# Patient Record
Sex: Male | Born: 1990 | Race: Black or African American | Hispanic: No | Marital: Single | Smoking: Current every day smoker
Health system: Southern US, Community
[De-identification: ages and names within clinical notes are randomized; demographics above are authoritative.]

## PROBLEM LIST (undated history)

## (undated) DIAGNOSIS — F909 Attention-deficit hyperactivity disorder, unspecified type: Secondary | ICD-10-CM

## (undated) DIAGNOSIS — J45909 Unspecified asthma, uncomplicated: Secondary | ICD-10-CM

## (undated) DIAGNOSIS — F209 Schizophrenia, unspecified: Secondary | ICD-10-CM

## (undated) DIAGNOSIS — F319 Bipolar disorder, unspecified: Secondary | ICD-10-CM

## (undated) HISTORY — PX: NO PAST SURGERIES: SHX2092

---
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
## 2009-10-12 ENCOUNTER — Emergency Department (HOSPITAL_COMMUNITY): Admission: EM | Admit: 2009-10-12 | Discharge: 2009-10-12 | Payer: Self-pay | Admitting: Emergency Medicine

## 2012-06-23 ENCOUNTER — Emergency Department (HOSPITAL_COMMUNITY)
Admission: EM | Admit: 2012-06-23 | Discharge: 2012-06-23 | Disposition: A | Discharge status: Home / Self Care | Payer: Self-pay | Attending: Emergency Medicine | Admitting: Emergency Medicine

## 2012-06-23 ENCOUNTER — Emergency Department (HOSPITAL_COMMUNITY): Payer: Self-pay

## 2012-06-23 ENCOUNTER — Encounter (HOSPITAL_COMMUNITY): Payer: Self-pay

## 2012-06-23 DIAGNOSIS — S0990XA Unspecified injury of head, initial encounter: Secondary | ICD-10-CM | POA: Insufficient documentation

## 2012-06-23 DIAGNOSIS — R220 Localized swelling, mass and lump, head: Secondary | ICD-10-CM | POA: Insufficient documentation

## 2012-06-23 DIAGNOSIS — R221 Localized swelling, mass and lump, neck: Secondary | ICD-10-CM | POA: Insufficient documentation

## 2012-06-23 DIAGNOSIS — R404 Transient alteration of awareness: Secondary | ICD-10-CM | POA: Insufficient documentation

## 2012-06-23 DIAGNOSIS — S0230XA Fracture of orbital floor, unspecified side, initial encounter for closed fracture: Secondary | ICD-10-CM | POA: Insufficient documentation

## 2012-06-23 DIAGNOSIS — IMO0002 Reserved for concepts with insufficient information to code with codable children: Secondary | ICD-10-CM

## 2012-06-23 DIAGNOSIS — R69 Illness, unspecified: Secondary | ICD-10-CM | POA: Insufficient documentation

## 2012-06-23 HISTORY — DX: Schizophrenia, unspecified: F20.9

## 2012-06-23 HISTORY — DX: Bipolar disorder, unspecified: F31.9

## 2012-06-23 LAB — COMPREHENSIVE METABOLIC PANEL
AST: 50 U/L — ABNORMAL HIGH (ref 0–37)
Alkaline Phosphatase: 137 U/L — ABNORMAL HIGH (ref 39–117)
BUN: 11 mg/dL (ref 6–23)
CO2: 26 mEq/L (ref 19–32)
Chloride: 104 mEq/L (ref 96–112)
Creatinine, Ser: 0.82 mg/dL (ref 0.50–1.35)
GFR calc non Af Amer: >90 mL/min (ref >90)
Potassium: 3.4 mEq/L — ABNORMAL LOW (ref 3.5–5.1)
Total Bilirubin: 0.5 mg/dL (ref 0.3–1.2)

## 2012-06-23 LAB — CBC WITH DIFFERENTIAL/PLATELET
Basophils Absolute: 0 10*3/uL (ref 0.0–0.1)
HCT: 39.5 % (ref 39.0–52.0)
Hemoglobin: 13 g/dL (ref 13.0–17.0)
Lymphocytes Relative: 12 % (ref 12–46)
Monocytes Absolute: 0.8 10*3/uL (ref 0.1–1.0)
Monocytes Relative: 8 % (ref 3–12)
Neutro Abs: 7.6 10*3/uL (ref 1.7–7.7)
RBC: 4.63 MIL/uL (ref 4.22–5.81)
WBC: 9.7 10*3/uL (ref 4.0–10.5)

## 2012-06-23 LAB — PROTIME-INR: INR: 0.98 (ref 0.00–1.49)

## 2012-06-23 MED ORDER — CLINDAMYCIN PHOSPHATE 600 MG/50ML IV SOLN
600.0000 mg | Freq: Once | INTRAVENOUS | Status: AC
  Administered 2012-06-23: 600 mg via INTRAVENOUS
  Filled 2012-06-23: qty 50

## 2012-06-23 MED ORDER — TETANUS-DIPHTH-ACELL PERTUSSIS 5-2.5-18.5 LF-MCG/0.5 IM SUSP
0.5000 mL | Freq: Once | INTRAMUSCULAR | Status: AC
  Administered 2012-06-23: 0.5 mL via INTRAMUSCULAR
  Filled 2012-06-23: qty 0.5

## 2012-06-23 MED ORDER — SODIUM CHLORIDE 0.9 % IV BOLUS (SEPSIS)
1000.0000 mL | Freq: Once | INTRAVENOUS | Status: AC
  Administered 2012-06-23: 1000 mL via INTRAVENOUS

## 2012-06-23 MED ORDER — CLINDAMYCIN HCL 150 MG PO CAPS: 150.0000 mg | ORAL_CAPSULE | Freq: Four times a day (QID) | ORAL | Status: DC

## 2012-06-23 MED ORDER — OXYCODONE-ACETAMINOPHEN 5-325 MG PO TABS: 1.0000 | ORAL_TABLET | ORAL | Status: AC | PRN

## 2012-06-23 NOTE — ED Notes (Signed)
Pt wound cleaned with NS. Pt given icepack for left eye. Pt asked for a urine sample and pt stated he didn't have to go to the bathroom.
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
## 2012-06-23 NOTE — ED Notes (Signed)
Pt moved to Pod B conference room.

## 2012-06-23 NOTE — ED Notes (Signed)
Advised patient we need a urine sample. 
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
## 2012-06-25 ENCOUNTER — Emergency Department (HOSPITAL_COMMUNITY)
Admission: EM | Admit: 2012-06-25 | Discharge: 2012-06-25 | Disposition: A | Discharge status: Home / Self Care | Payer: Self-pay | Attending: Emergency Medicine | Admitting: Emergency Medicine

## 2012-06-25 ENCOUNTER — Encounter (HOSPITAL_COMMUNITY): Payer: Self-pay | Admitting: Emergency Medicine

## 2012-06-25 ENCOUNTER — Emergency Department (HOSPITAL_COMMUNITY): Payer: Self-pay

## 2012-06-25 DIAGNOSIS — Z8659 Personal history of other mental and behavioral disorders: Secondary | ICD-10-CM | POA: Insufficient documentation

## 2012-06-25 DIAGNOSIS — F17200 Nicotine dependence, unspecified, uncomplicated: Secondary | ICD-10-CM | POA: Insufficient documentation

## 2012-06-25 DIAGNOSIS — Z79899 Other long term (current) drug therapy: Secondary | ICD-10-CM | POA: Insufficient documentation

## 2012-06-25 DIAGNOSIS — F319 Bipolar disorder, unspecified: Secondary | ICD-10-CM | POA: Insufficient documentation

## 2012-06-25 DIAGNOSIS — R4182 Altered mental status, unspecified: Secondary | ICD-10-CM | POA: Insufficient documentation

## 2012-06-25 LAB — COMPREHENSIVE METABOLIC PANEL
BUN: 14 mg/dL (ref 6–23)
CO2: 25 mEq/L (ref 19–32)
Calcium: 8.8 mg/dL (ref 8.4–10.5)
Creatinine, Ser: 0.84 mg/dL (ref 0.50–1.35)
GFR calc Af Amer: >90 mL/min (ref >90)
GFR calc non Af Amer: >90 mL/min (ref >90)
Glucose, Bld: 93 mg/dL (ref 70–99)
Sodium: 138 mEq/L (ref 135–145)
Total Protein: 6.6 g/dL (ref 6.0–8.3)

## 2012-06-25 LAB — RAPID URINE DRUG SCREEN, HOSP PERFORMED
Amphetamines: NOT DETECTED
Benzodiazepines: NOT DETECTED
Cocaine: NOT DETECTED
Opiates: POSITIVE — AB

## 2012-06-25 LAB — CBC WITH DIFFERENTIAL/PLATELET
Eosinophils Absolute: 0.2 10*3/uL (ref 0.0–0.7)
Eosinophils Relative: 2 % (ref 0–5)
HCT: 34.4 % — ABNORMAL LOW (ref 39.0–52.0)
Lymphocytes Relative: 29 % (ref 12–46)
Lymphs Abs: 2.2 10*3/uL (ref 0.7–4.0)
MCH: 28.4 pg (ref 26.0–34.0)
MCV: 84.1 fL (ref 78.0–100.0)
Monocytes Absolute: 0.6 10*3/uL (ref 0.1–1.0)
Monocytes Relative: 8 % (ref 3–12)
Platelets: 303 10*3/uL (ref 150–400)
RBC: 4.09 MIL/uL — ABNORMAL LOW (ref 4.22–5.81)

## 2012-06-25 MED ORDER — SODIUM CHLORIDE 0.9 % IV SOLN
Freq: Once | INTRAVENOUS | Status: AC
  Administered 2012-06-25: 20:00:00 via INTRAVENOUS

## 2012-06-25 MED ORDER — NALOXONE HCL 0.4 MG/ML IJ SOLN
0.4000 mg | Freq: Once | INTRAMUSCULAR | Status: AC
  Administered 2012-06-25: 0.4 mg via INTRAVENOUS
  Filled 2012-06-25: qty 1
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
## 2012-06-25 NOTE — ED Notes (Signed)
Discharge instructions reviewed w/ pt and family member, verbalize understanding. No prescriptions provided at discharge.
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
## 2012-06-26 ENCOUNTER — Emergency Department (HOSPITAL_COMMUNITY)
Admission: EM | Admit: 2012-06-26 | Discharge: 2012-06-30 | Disposition: A | Discharge status: Home / Self Care | Payer: Self-pay | Attending: Emergency Medicine | Admitting: Emergency Medicine

## 2012-06-26 ENCOUNTER — Encounter (HOSPITAL_COMMUNITY): Payer: Self-pay | Admitting: Emergency Medicine

## 2012-06-26 DIAGNOSIS — F259 Schizoaffective disorder, unspecified: Secondary | ICD-10-CM | POA: Insufficient documentation

## 2012-06-26 DIAGNOSIS — R220 Localized swelling, mass and lump, head: Secondary | ICD-10-CM | POA: Insufficient documentation

## 2012-06-26 DIAGNOSIS — Z046 Encounter for general psychiatric examination, requested by authority: Secondary | ICD-10-CM | POA: Insufficient documentation

## 2012-06-26 DIAGNOSIS — F309 Manic episode, unspecified: Secondary | ICD-10-CM | POA: Insufficient documentation

## 2012-06-26 DIAGNOSIS — F17200 Nicotine dependence, unspecified, uncomplicated: Secondary | ICD-10-CM | POA: Insufficient documentation

## 2012-06-26 DIAGNOSIS — Z79899 Other long term (current) drug therapy: Secondary | ICD-10-CM | POA: Insufficient documentation

## 2012-06-26 LAB — ETHANOL: Alcohol, Ethyl (B): <11 mg/dL (ref 0–11)

## 2012-06-26 LAB — CBC
HCT: 37.6 % — ABNORMAL LOW (ref 39.0–52.0)
Hemoglobin: 12.3 g/dL — ABNORMAL LOW (ref 13.0–17.0)
MCH: 27.9 pg (ref 26.0–34.0)
MCHC: 32.7 g/dL (ref 30.0–36.0)
RBC: 4.41 MIL/uL (ref 4.22–5.81)

## 2012-06-26 LAB — COMPREHENSIVE METABOLIC PANEL
ALT: 34 U/L (ref 0–53)
AST: 33 U/L (ref 0–37)
CO2: 31 mEq/L (ref 19–32)
Calcium: 9.4 mg/dL (ref 8.4–10.5)
Chloride: 102 mEq/L (ref 96–112)
Creatinine, Ser: 0.87 mg/dL (ref 0.50–1.35)
GFR calc Af Amer: >90 mL/min (ref >90)
GFR calc non Af Amer: >90 mL/min (ref >90)
Glucose, Bld: 80 mg/dL (ref 70–99)
Total Bilirubin: 0.3 mg/dL (ref 0.3–1.2)

## 2012-06-26 LAB — RAPID URINE DRUG SCREEN, HOSP PERFORMED
Barbiturates: NOT DETECTED
Benzodiazepines: NOT DETECTED

## 2012-06-26 MED ORDER — BENZTROPINE MESYLATE 1 MG PO TABS
1.0000 mg | ORAL_TABLET | Freq: Two times a day (BID) | ORAL | Status: DC
  Administered 2012-06-26 – 2012-06-30 (×9): 1 mg via ORAL
  Filled 2012-06-26 (×9): qty 1

## 2012-06-26 MED ORDER — CLINDAMYCIN HCL 150 MG PO CAPS
150.0000 mg | ORAL_CAPSULE | Freq: Four times a day (QID) | ORAL | Status: DC
  Administered 2012-06-26 – 2012-06-30 (×16): 150 mg via ORAL
  Filled 2012-06-26 (×21): qty 1

## 2012-06-26 MED ORDER — VENLAFAXINE HCL 75 MG PO TABS
75.0000 mg | ORAL_TABLET | Freq: Every morning | ORAL | Status: DC
  Administered 2012-06-26 – 2012-06-27 (×2): 75 mg via ORAL
  Filled 2012-06-26 (×2): qty 1

## 2012-06-26 MED ORDER — RISPERIDONE 2 MG PO TABS
3.0000 mg | ORAL_TABLET | Freq: Two times a day (BID) | ORAL | Status: DC
  Administered 2012-06-26 – 2012-06-30 (×9): 3 mg via ORAL
  Filled 2012-06-26 (×3): qty 1
  Filled 2012-06-26: qty 3
  Filled 2012-06-26 (×2): qty 1
  Filled 2012-06-26: qty 3
  Filled 2012-06-26 (×3): qty 1

## 2012-06-26 MED ORDER — ZIPRASIDONE MESYLATE 20 MG IM SOLR
INTRAMUSCULAR | Status: AC
  Filled 2012-06-26: qty 20

## 2012-06-26 MED ORDER — QUETIAPINE FUMARATE 50 MG PO TABS
50.0000 mg | ORAL_TABLET | Freq: Two times a day (BID) | ORAL | Status: DC
  Administered 2012-06-26 – 2012-06-27 (×3): 50 mg via ORAL
  Filled 2012-06-26 (×3): qty 1

## 2012-06-26 MED ORDER — ZIPRASIDONE MESYLATE 20 MG IM SOLR
20.0000 mg | Freq: Once | INTRAMUSCULAR | Status: AC
  Administered 2012-06-26: 20 mg via INTRAMUSCULAR

## 2012-06-26 NOTE — ED Notes (Signed)
Tele Pysch in progress 
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
## 2012-06-26 NOTE — ED Provider Notes (Signed)
1100.  Pt ha been recommended for inpt psych eval and mgmnt by the telepsych provider.  ACT Team is seeking placement.  Tobin Chad, MD 06/26/12 1110
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
## 2012-06-26 NOTE — ED Notes (Signed)
Please call PO when pt is being d/c on transferred.Pt has warrant pending for probation validation.Constance Holster @ office [phone] Cell- [phone].
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
## 2012-06-26 NOTE — ED Notes (Signed)
Pt agitated and pacing in room

## 2012-06-26 NOTE — ED Notes (Signed)
Pt in and out of room talking loud in the halls instructed to stay in room with [employer] assitance

## 2012-06-26 NOTE — ED Notes (Signed)
Pt agitated and requesting to be handcuffed.  Pt handcuffed by [employer] and Kathreen Cornfield and Dr. Patria Mane made aware.  Administered Geodon as ordered.

## 2012-06-27 DIAGNOSIS — F259 Schizoaffective disorder, unspecified: Secondary | ICD-10-CM

## 2012-06-27 DIAGNOSIS — F309 Manic episode, unspecified: Secondary | ICD-10-CM

## 2012-06-27 MED ORDER — ZOLPIDEM TARTRATE 5 MG PO TABS
5.0000 mg | ORAL_TABLET | Freq: Every evening | ORAL | Status: DC | PRN
  Administered 2012-06-27 – 2012-06-29 (×3): 5 mg via ORAL
  Filled 2012-06-27 (×3): qty 1

## 2012-06-27 MED ORDER — IBUPROFEN 600 MG PO TABS: 600.0000 mg | ORAL_TABLET | Freq: Three times a day (TID) | ORAL | Status: DC | PRN

## 2012-06-27 MED ORDER — ACETAMINOPHEN 325 MG PO TABS: 650.0000 mg | ORAL_TABLET | ORAL | Status: DC | PRN

## 2012-06-27 MED ORDER — DIVALPROEX SODIUM ER 500 MG PO TB24
1000.0000 mg | ORAL_TABLET | Freq: Every day | ORAL | Status: DC
  Administered 2012-06-27: 1000 mg via ORAL
  Filled 2012-06-27 (×2): qty 2

## 2012-06-27 MED ORDER — LORAZEPAM 1 MG PO TABS
1.0000 mg | ORAL_TABLET | Freq: Three times a day (TID) | ORAL | Status: DC | PRN
  Administered 2012-06-27 – 2012-06-28 (×2): 1 mg via ORAL
  Filled 2012-06-27 (×3): qty 1

## 2012-06-27 MED ORDER — NICOTINE 21 MG/24HR TD PT24
21.0000 mg | MEDICATED_PATCH | Freq: Every day | TRANSDERMAL | Status: DC | PRN
  Administered 2012-06-27: 21 mg via TRANSDERMAL
  Filled 2012-06-27 (×2): qty 1

## 2012-06-27 MED ORDER — ONDANSETRON HCL 4 MG PO TABS: 4.0000 mg | ORAL_TABLET | Freq: Three times a day (TID) | ORAL | Status: DC | PRN

## 2012-06-27 NOTE — [facility] Counselor (Addendum)
Referred to the [facility].  No bed availability at [place], [place], [place], [place], [place], [place], and [place]. Pt referred to [facility] Authorization number is [id number]. Per Amor, pt in on wait list at [facility].
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
## 2012-06-27 NOTE — ED Notes (Signed)
Patient does not eat pork. Patient states he is of the muslim religion.

## 2012-06-27 NOTE — ED Provider Notes (Signed)
He is currently, calm, and cooperative. He is not expressing active suicidal or homicidal ideation. ACT is evaluating him in conjunction with the psychiatrist. He may be able to be discharged today with a no harm contract.  Flint Melter, MD 06/27/12 [phone]

## 2012-06-27 NOTE — ED Notes (Signed)
Heat pack given to pt. To apply to his right eye.
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
## 2012-06-27 NOTE — ED Notes (Signed)
Pt. Continues to go from room to bathroom to RN station, have asked pt. To please remain in room unless he needs something.

## 2012-06-27 NOTE — ED Notes (Signed)
Patient has hard time falling asleep. So medication will be held until patient wakes up.

## 2012-06-27 NOTE — ED Notes (Signed)
Heat pack given to pt. Apply to his right eye.

## 2012-06-28 MED ORDER — DIVALPROEX SODIUM ER 500 MG PO TB24
1500.0000 mg | ORAL_TABLET | Freq: Every day | ORAL | Status: DC
  Administered 2012-06-28 – 2012-06-30 (×3): 1500 mg via ORAL
  Filled 2012-06-28 (×4): qty 3

## 2012-06-28 MED ORDER — LORAZEPAM 1 MG PO TABS
1.0000 mg | ORAL_TABLET | Freq: Four times a day (QID) | ORAL | Status: DC | PRN
  Administered 2012-06-28 – 2012-06-30 (×3): 1 mg via ORAL
  Filled 2012-06-28 (×4): qty 1

## 2012-06-28 MED ORDER — DIVALPROEX SODIUM ER 500 MG PO TB24: 1500.0000 mg | ORAL_TABLET | Freq: Every day | ORAL | Status: DC

## 2012-06-28 NOTE — Consult Note (Signed)
Reason for Consult: Schizoaffective, bipolar, manic symptoms Referring Physician: Dr. Leeroy Cha is an 21 y.o. male.  HPI: Patient was seen and chart reviewed. Patient has a limited insight, and impulse control. Patient has been threatening the staff nurse that he is going to throw severe anger outbursts, if he was not released. Patient was not afraid to go back to the jail. Patient the was served staying in his bed awake, alert, oriented, requesting to be sent home. Patient was stated he is difficult for him to stay in his bed and cannot, pacing on the floor. Patient stated he does not know why his mother brought him to the emergency department. Patient denies symptoms of for depression, psychosis, suicidal, homicidal ideation, but he continued to have increased energy, goal-directed activity, racing thoughts, and decreased need for sleep.  Past Medical History  Diagnosis Date  . Bipolar affective disorder   . Schizophrenia     History reviewed. No pertinent past surgical history.  Family History  Problem Relation Age of Onset  . Hypertension Mother     Social History:  reports that he has been smoking.  He does not have any smokeless tobacco history on file. He reports that he drinks about .6 ounces of alcohol per week. His drug history not on file.  Allergies: No Known Allergies  Medications: I have reviewed the patient's current medications.  No results found for this or any previous visit (from the past 48 hour(s)).  No results found.  Positive for aggressive behavior, bipolar, mood swings and sleep disturbance Blood pressure 121/69, pulse 77, temperature 97.9 F (36.6 C), temperature source Oral, resp. rate 20, SpO2 98.00%.   Assessment/Plan: Schizoaffective disorder, most recent episode, mania  Will increase Depakote to 1500 mg at bedtime and continue his Risperdal 3 mg 2 times in the Cogentin 1 mg 2 times daily. Patient has been waiting for the [facility] hospital bed availability.  Zayda Angell,JANARDHAHA R. 06/28/2012, 12:13 PM

## 2012-06-28 NOTE — ED Provider Notes (Addendum)
  Physical Exam  BP 120/86  Pulse 102  Temp 97.3 F (36.3 C) (Oral)  Resp 18  SpO2 99%  Physical Exam  ED Course  Procedures  MDM Patient was psychotic break. Recently seen for results. At that time does not appear to be a risk to himself. He is on [facility] waiting list. Is also been referred to [facility]. Patient is now stated that he is going to start getting violent he doesn't treatment and will go back to jail. He'll be seen by Dr. Para March R. Rubin Payor, MD 06/28/12 1610  Juliet Rude Rubin Payor, MD 06/28/12 [phone]

## 2012-06-28 NOTE — Progress Notes (Signed)
Paged at 1738 and was in ED [facility] Unit at 1608. Spoke with nurses concerning Philip [name] status. Philip [name] asked to see a chaplain after such was offered to him.  Philip [name] is a spiritual person, who studies the writings of various religions. He says he is Muslim, but when a visit by an Imam was offered as a possibility he said he did not wish to see Muslims. He has holy books of at least two faith traditions, [place] and Muslim. When speaking of religion he is calm and reflective. With religion as a gate to our discussions, we practiced both breathing and meditation exercises which calmed him down until he needed to go to the restroom. (This is a common response to the near total relaxed state he was in)  After he returned he wished to watch television rather than return to the relaxed state he was in. What did happen while he was relaxed was his nervous twitch of the feet and hands ceased.  Mindfulness meditation rather than mind emptying meditation seems to be best for Philip [name].   Philip [name] asked if he could walk in the sunshine and smoke a cigarette. Chaplain responded that he would pass these requests on to staff, but he, the chaplain, had no control over such matters. I wonder if nicotine withdrawal may be a contributing factor to his anxious condition.  Philip [name] takes suggestions better than I expected for an initial visit.  Simple meditation guiding procedures taught to nurse at her request.   Follow up visits by chaplains recommended. Chaplains should be a called when "upsetting news" is presented

## 2012-06-29 NOTE — ED Notes (Signed)
Pt vomited his breakfast all over the bathroom. He offered and did clean it up himself. We notified housekeeping but they haven't come as yet. Pt said he seen at least one big pill in the vomit. Pt came up and asked what the big white pills were and what they were for as well. He said he'd never taken them before and the eggs may have made him vomit.

## 2012-06-29 NOTE — ED Notes (Signed)
Pt is in and out of his room asking if he talks right to the doctor will he let him go home because it's driving him crazy being in here and staying in his room makes him think of crazy things that he's going to end up doing. He said he will take some medicine. Will medicate per orders if permitted.

## 2012-06-29 NOTE — ED Notes (Signed)
Up to nursing station talking to writer about TV show he's watching.

## 2012-06-29 NOTE — ED Notes (Signed)
Pt out of the shower and up using the phone.
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
## 2012-06-29 NOTE — ED Notes (Signed)
Up to bathroom

## 2012-06-29 NOTE — ED Notes (Signed)
Pt given items to shower and is currently in the shower.

## 2012-06-29 NOTE — ED Notes (Signed)
Pt is in bathroom brushing his teeth.

## 2012-06-29 NOTE — ED Notes (Signed)
Pt reports he doesn't eat pork. He's been up to the nursing station a few times asking to take his shower now. Tried to redirect him as Clinical research associate was in the middle of charting trays.

## 2012-06-29 NOTE — ED Notes (Signed)
No PRN orders to give for agitation will continue to watch pt's behavior.

## 2012-06-29 NOTE — ED Provider Notes (Addendum)
BP 114/78  Pulse 77  Temp 97.8 F (36.6 C) (Oral)  Resp 20  SpO2 100% No new issues overnight. Resting well. On [facility] waiting list.   Loren Racer, MD 06/29/12 0732  BP 102/63  Pulse 71  Temp 97.8 F (36.6 C) (Oral)  Resp 18  SpO2 100% No new issues overnight. Waiting list at [facility]  Loren Racer, MD 06/30/12 [phone]  Cleared for D/C by Dr Shela Commons. IVC rescinded. F/u with outpatient psych  Loren Racer, MD 06/30/12 1340

## 2012-06-29 NOTE — ED Notes (Signed)
Up to nursing station

## 2012-06-29 NOTE — ED Notes (Signed)
Pt came up to nursing station to check the time. He easily redirected back to his room.

## 2012-06-29 NOTE — ED Notes (Signed)
Up to nursing station asking off duty officer a bunch of questions.

## 2012-06-30 MED ORDER — BENZTROPINE MESYLATE 1 MG PO TABS: 1.0000 mg | ORAL_TABLET | Freq: Two times a day (BID) | ORAL | Status: DC

## 2012-06-30 MED ORDER — DIVALPROEX SODIUM ER 500 MG PO TB24: 1500.0000 mg | ORAL_TABLET | Freq: Every day | ORAL | Status: DC

## 2012-06-30 MED ORDER — CLINDAMYCIN HCL 150 MG PO CAPS: 150.0000 mg | ORAL_CAPSULE | Freq: Four times a day (QID) | ORAL | Status: AC

## 2012-06-30 MED ORDER — ZIPRASIDONE MESYLATE 20 MG IM SOLR: 10.0000 mg | Freq: Four times a day (QID) | INTRAMUSCULAR | Status: DC | PRN

## 2012-06-30 MED ORDER — ZIPRASIDONE MESYLATE 20 MG IM SOLR
10.0000 mg | Freq: Once | INTRAMUSCULAR | Status: DC
  Filled 2012-06-30: qty 20

## 2012-06-30 MED ORDER — RISPERIDONE 3 MG PO TABS: 3.0000 mg | ORAL_TABLET | Freq: Two times a day (BID) | ORAL | Status: DC

## 2012-06-30 NOTE — ED Notes (Signed)
Act into see 

## 2012-06-30 NOTE — ED Notes (Signed)
Up to the bathroom 

## 2012-06-30 NOTE — ED Notes (Signed)
Up to the desk on the phone 

## 2012-06-30 NOTE — ED Notes (Signed)
Pt up to the desk, frustrated about having to stay. Reports that he would rather go back to jail than stay here and do his 6 months.  Pt is aware that he is going to talk w/ the telepsych today. Redirected back to his room w/ difficulty, but will not stay  there.

## 2012-06-30 NOTE — ED Notes (Signed)
Pt h as been dc'd by dr Elsie Saas.  Pt is aware, up to the desk to call him mom for a ride home.  Pt has a appt on sept 5 w/ his psychatrist at [facility] and was instructed to take his meds as directed, follow  and  with his MD as scheduled.  Pt also encouraged to follow up w/ his MD concerning his eye, and to return for any increased pain/difficulty w/ his vision.  Pt verbalized understanding.

## 2012-06-30 NOTE — ED Notes (Addendum)
Laying quietly in room, calm, cooperative, pleasant asking about telepsych.
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
## 2012-06-30 NOTE — ED Notes (Addendum)
Pt getting increasinly aggitated,Security into assist, dr Ranae Palms aware, orders received.  Pt not wanting to go to his room because "the devil's in there".  Pt did return to room, talking w/ security.

## 2012-06-30 NOTE — ED Notes (Signed)
Pt's sister is here to take him home.  Dc instructions and meds reviewed w/ both,  pt and sister verbalized understanding.  Pt encouraged to take medications as directed, follow up concerning his face/eye as instructed and return for any problems.  Pt verbalized understanding.  Belongings returned after pt left the unit.

## 2012-06-30 NOTE — ED Notes (Signed)
Up tot he bathroom to shower and change scrubs 

## 2012-06-30 NOTE — ED Notes (Signed)
Pt laying in room, quiet, still angry, and is angry that the dr's are listening ti his mother instead of him.  Pt still saying he would rather go th jail instead of stay here.  NAD, pt is aware that he will talk w/ the psychatrist today.

## 2012-06-30 NOTE — Consult Note (Signed)
Reason for Consult: Schizoaffective disorder, recent episode is mania, who Referring Physician: Dr. Charissa Bash is an 21 y.o. male.  HPI: Patient was seen and chart reviewed. Patient has been stable on his current medication management without adverse effect of his current medication. Patient has been feeling better, and has no irritability, agitation and aggressive behaviors. Patient has requested to be released to the outpatient psychiatric services and willing to take his medication. Patient has denied symptoms of depression, anxiety, auditory, visual hallucinations, delusions, and paranoia. Patient has been taking his medication without extrapyramidal symptoms.  Past Medical History  Diagnosis Date  . Bipolar affective disorder   . Schizophrenia     History reviewed. No pertinent past surgical history.  Family History  Problem Relation Age of Onset  . Hypertension Mother     Social History:  reports that he has been smoking.  He does not have any smokeless tobacco history on file. He reports that he drinks about .6 ounces of alcohol per week. His drug history not on file.  Allergies: No Known Allergies  Medications: I have reviewed the patient's current medications.  No results found for this or any previous visit (from the past 48 hour(s)).  No results found.  No depression, No anxiety, No psychosis and Positive for aggressive behavior, learning difficulty, mood swings and tobacco use Blood pressure 114/74, pulse 89, temperature 97.4 F (36.3 C), temperature source Oral, resp. rate 20, SpO2 100.00%.   Assessment/Plan: Schizoaffective disorder, recent episode is mania.  Recommended outpatient psychiatric services with the medication management at [facility] behavioral health. Patient does not meet criteria for acute psychiatric hospitalization at this time and his the involuntary commitment will be rescended. His current medications. For benztropine 1 mg 2 times  daily Depakote ER 1500 mg at bedtime, Risperdal 3 mg twice daily. May provide prescription which may help until he can reach his the outpatient psychiatric services.  Emanuelle Hammerstrom,JANARDHAHA R. 06/30/2012, 1:12 PM

## 2012-07-01 ENCOUNTER — Telehealth (HOSPITAL_COMMUNITY): Payer: Self-pay | Admitting: Licensed Clinical Social Worker

## 2012-07-02 NOTE — ED Provider Notes (Signed)
Medical screening examination/treatment/procedure(s) were performed by non-physician practitioner and as supervising physician I was immediately available for consultation/collaboration.   Lyanne Co, MD 07/02/12 [phone]
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
## 2014-08-25 ENCOUNTER — Encounter (HOSPITAL_COMMUNITY): Payer: Self-pay | Admitting: Emergency Medicine

## 2014-08-25 ENCOUNTER — Emergency Department (HOSPITAL_COMMUNITY)
Admission: EM | Admit: 2014-08-25 | Discharge: 2014-08-26 | Disposition: A | Discharge status: Home / Self Care | Payer: Self-pay | Attending: Emergency Medicine | Admitting: Emergency Medicine

## 2014-08-25 DIAGNOSIS — F1410 Cocaine abuse, uncomplicated: Secondary | ICD-10-CM | POA: Insufficient documentation

## 2014-08-25 DIAGNOSIS — Z79899 Other long term (current) drug therapy: Secondary | ICD-10-CM | POA: Insufficient documentation

## 2014-08-25 DIAGNOSIS — F209 Schizophrenia, unspecified: Secondary | ICD-10-CM | POA: Insufficient documentation

## 2014-08-25 DIAGNOSIS — Z720 Tobacco use: Secondary | ICD-10-CM | POA: Insufficient documentation

## 2014-08-25 DIAGNOSIS — F3131 Bipolar disorder, current episode depressed, mild: Secondary | ICD-10-CM | POA: Insufficient documentation

## 2014-08-25 DIAGNOSIS — F1210 Cannabis abuse, uncomplicated: Secondary | ICD-10-CM | POA: Insufficient documentation

## 2014-08-25 DIAGNOSIS — F419 Anxiety disorder, unspecified: Secondary | ICD-10-CM | POA: Insufficient documentation

## 2014-08-25 LAB — COMPREHENSIVE METABOLIC PANEL
ALK PHOS: 87 U/L (ref 39–117)
ALT: 21 U/L (ref 0–53)
AST: 23 U/L (ref 0–37)
Albumin: 4.4 g/dL (ref 3.5–5.2)
Anion gap: 17 — ABNORMAL HIGH (ref 5–15)
BILIRUBIN TOTAL: 0.9 mg/dL (ref 0.3–1.2)
BUN: 11 mg/dL (ref 6–23)
CHLORIDE: 102 meq/L (ref 96–112)
CO2: 24 meq/L (ref 19–32)
CREATININE: 1.19 mg/dL (ref 0.50–1.35)
Calcium: 9.5 mg/dL (ref 8.4–10.5)
GFR calc Af Amer: >90 mL/min (ref >90)
GFR, EST NON AFRICAN AMERICAN: 84 mL/min — AB (ref >90)
Glucose, Bld: 99 mg/dL (ref 70–99)
POTASSIUM: 3.3 meq/L — AB (ref 3.7–5.3)
SODIUM: 143 meq/L (ref 137–147)
Total Protein: 7.8 g/dL (ref 6.0–8.3)

## 2014-08-25 LAB — RAPID URINE DRUG SCREEN, HOSP PERFORMED
AMPHETAMINES: NOT DETECTED
BARBITURATES: NOT DETECTED
BENZODIAZEPINES: NOT DETECTED
Cocaine: POSITIVE — AB
Opiates: NOT DETECTED
TETRAHYDROCANNABINOL: POSITIVE — AB

## 2014-08-25 LAB — CBC
HCT: 40.3 % (ref 39.0–52.0)
Hemoglobin: 13.5 g/dL (ref 13.0–17.0)
MCH: 29.2 pg (ref 26.0–34.0)
MCHC: 33.5 g/dL (ref 30.0–36.0)
MCV: 87 fL (ref 78.0–100.0)
PLATELETS: 264 10*3/uL (ref 150–400)
RBC: 4.63 MIL/uL (ref 4.22–5.81)
RDW: 12.3 % (ref 11.5–15.5)
WBC: 8.7 10*3/uL (ref 4.0–10.5)

## 2014-08-25 LAB — ACETAMINOPHEN LEVEL: Acetaminophen (Tylenol), Serum: <15 ug/mL (ref 10–30)

## 2014-08-25 LAB — ETHANOL: Alcohol, Ethyl (B): <11 mg/dL (ref 0–11)

## 2014-08-25 LAB — SALICYLATE LEVEL: Salicylate Lvl: <2 mg/dL — ABNORMAL LOW (ref 2.8–20.0)

## 2014-08-25 MED ORDER — IBUPROFEN 200 MG PO TABS: 600.0000 mg | ORAL_TABLET | Freq: Three times a day (TID) | ORAL | Status: DC | PRN

## 2014-08-25 MED ORDER — ACETAMINOPHEN 325 MG PO TABS: 650.0000 mg | ORAL_TABLET | ORAL | Status: DC | PRN

## 2014-08-25 MED ORDER — ALUM & MAG HYDROXIDE-SIMETH 200-200-20 MG/5ML PO SUSP: 30.0000 mL | ORAL | Status: DC | PRN

## 2014-08-25 MED ORDER — ONDANSETRON HCL 4 MG PO TABS: 4.0000 mg | ORAL_TABLET | Freq: Three times a day (TID) | ORAL | Status: DC | PRN

## 2014-08-25 MED ORDER — TRAZODONE HCL 50 MG PO TABS
50.0000 mg | ORAL_TABLET | Freq: Every evening | ORAL | Status: DC | PRN
  Administered 2014-08-25: 50 mg via ORAL
  Filled 2014-08-25: qty 1

## 2014-08-25 MED ORDER — HALOPERIDOL 5 MG PO TABS
5.0000 mg | ORAL_TABLET | Freq: Four times a day (QID) | ORAL | Status: DC | PRN
  Administered 2014-08-25: 5 mg via ORAL
  Filled 2014-08-25: qty 1

## 2014-08-25 MED ORDER — LORAZEPAM 1 MG PO TABS
1.0000 mg | ORAL_TABLET | Freq: Three times a day (TID) | ORAL | Status: DC | PRN
  Administered 2014-08-25: 1 mg via ORAL
  Filled 2014-08-25: qty 1

## 2014-08-25 NOTE — ED Notes (Signed)
Pt has in belonging bag: white shirt, black sweat pants, gray tennis shoes.

## 2014-08-25 NOTE — Progress Notes (Signed)
  CARE MANAGEMENT ED NOTE 08/25/2014  Patient:  Philip [name],Philip [name]   Account Number:  [id number]  Date Initiated:  08/25/2014  Documentation initiated by:  Radford PaxFERRERO,Tarin Navarez  Subjective/Objective Assessment:   Patient presents to Ed with insomnia, hearing voices and HI     Subjective/Objective Assessment Detail:   Patient with pmhx of Bipolar effective disorder, Schizophrenia     Action/Plan:   Action/Plan Detail:   Anticipated DC Date:       Status Recommendation to Physician:   Result of Recommendation:    Other ED Services  Consult Working Plan    DC Planning Services  Other  PCP issues    Choice offered to / List presented to:            Status of service:  Completed, signed off  ED Comments:   ED Comments Detail:  EDCM spoke to patient at bedside.  Patient listed as not having a pcp or [place]. Patient is speaking quickly, going from one topic to the next.  [facility]  Patient responded, "Well, in prison it was umm, but when I got out it was the lady through the TV but I don't want to see her anymore.  My family has my records, I've been moving too fast, I just want to chill.  I know I'm not supposed to come here for my medications, but I stopped taking my medications because I thought they were messing me up but now I think they wetre slowing me down.  I ain't slept in four days.  I just want to chill."  Patient was able to say that [facility].  No further EDCM needs at this time.

## 2014-08-25 NOTE — ED Notes (Signed)
Patient up in his room working out and rapping

## 2014-08-25 NOTE — ED Notes (Addendum)
Pt reports he is having thoughts of hurting other people. Pt reports he is hearing, voices are telling him to hurt others. Pt says that he has been up for 4 days, anxious in triage.

## 2014-08-25 NOTE — ED Notes (Signed)
Pt unable to give UA at the present time.
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
## 2014-08-26 DIAGNOSIS — F319 Bipolar disorder, unspecified: Secondary | ICD-10-CM

## 2014-08-26 MED ORDER — TRAZODONE HCL 50 MG PO TABS: 50.0000 mg | ORAL_TABLET | Freq: Every evening | ORAL | Status: DC | PRN

## 2014-08-26 MED ORDER — QUETIAPINE FUMARATE 50 MG PO TABS: 50.0000 mg | ORAL_TABLET | Freq: Every day | ORAL | Status: DC
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
## 2014-08-26 NOTE — Discharge Instructions (Signed)

## 2014-08-26 NOTE — ED Provider Notes (Signed)
CSN: [id number]     Arrival date & time 08/25/14  2009 History   First MD Initiated Contact with Patient 08/25/14 2050     Chief Complaint  Patient presents with  . Psychiatric Evaluation     (Consider location/radiation/quality/duration/timing/severity/associated sxs/prior Treatment) HPI  Past Medical History  Diagnosis Date  . Bipolar affective disorder   . Schizophrenia    History reviewed. No pertinent past surgical history. Family History  Problem Relation Age of Onset  . Hypertension Mother    History  Substance Use Topics  . Smoking status: Current Every Day Smoker -- 1.00 packs/day for 6 years  . Smokeless tobacco: Not on file  . Alcohol Use: 0.6 oz/week    1 Cans of beer per week    Review of Systems  Level V caveat applies due to psychosis and hallucinations  Allergies  Shrimp  Home Medications   Prior to Admission medications   Medication Sig Start Date End Date Taking? Authorizing Provider  benztropine (COGENTIN) 1 MG tablet Take 1 tablet (1 mg total) by mouth 2 (two) times daily. 06/30/12   Loren Raceravid Yelverton, MD  divalproex (DEPAKOTE ER) 500 MG 24 hr tablet Take 3 tablets (1,500 mg total) by mouth at bedtime. 06/30/12 06/30/13  Loren Raceravid Yelverton, MD  QUEtiapine (SEROQUEL) 50 MG tablet Take 50 mg by mouth 2 (two) times daily.     Historical Provider, MD  risperiDONE (RISPERDAL) 3 MG tablet Take 1 tablet (3 mg total) by mouth 2 (two) times daily. 06/30/12   Loren Raceravid Yelverton, MD  venlafaxine (EFFEXOR) 75 MG tablet Take 75 mg by mouth every morning.     Historical Provider, MD   BP 137/88  Pulse 99  Temp(Src) 98.3 F (36.8 C) (Oral)  Resp 18  SpO2 95% Physical Exam  Nursing note and vitals reviewed. Constitutional: He is oriented to person, place, and time. He appears well-developed and well-nourished. No distress.  HENT:  Head: Normocephalic and atraumatic.  Mouth/Throat: Oropharynx is clear and moist.  Eyes: Pupils are equal, round, and reactive to light.   Neck: Normal range of motion. Neck supple.  Cardiovascular: Normal rate, regular rhythm and normal heart sounds.  Exam reveals no gallop and no friction rub.   No murmur heard. Pulmonary/Chest: Effort normal and breath sounds normal.  Musculoskeletal: He exhibits no edema.  Neurological: He is alert and oriented to person, place, and time. He exhibits normal muscle tone. Coordination normal.  Skin: Skin is warm and dry. No rash noted. No erythema.  Psychiatric: His mood appears anxious. His speech is rapid and/or pressured. He is hyperactive. He expresses homicidal and suicidal ideation.    ED Course  Procedures (including critical care time) Labs Review Labs Reviewed  COMPREHENSIVE METABOLIC PANEL - Abnormal; Notable for the following:    Potassium 3.3 (*)    GFR calc non Af Amer 84 (*)    Anion gap 17 (*)    All other components within normal limits  SALICYLATE LEVEL - Abnormal; Notable for the following:    Salicylate Lvl <2.0 (*)    All other components within normal limits  URINE RAPID DRUG SCREEN (HOSP PERFORMED) - Abnormal; Notable for the following:    Cocaine POSITIVE (*)    Tetrahydrocannabinol POSITIVE (*)    All other components within normal limits  ACETAMINOPHEN LEVEL  CBC  ETHANOL     Patient will need psychiatric evaluation as he does appear to be psychotic and hallucinating      Carlyle Dollyhristopher W Shariece Viveiros, PA-C  08/26/14 0119 

## 2014-08-26 NOTE — Progress Notes (Signed)
Patient was provided with clean scrubs and a personal hygiene kit.
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
## 2014-08-26 NOTE — [facility] Suicide Risk Assessment (Signed)
   Demographic Factors:  Male and Unemployed  Total Time spent with patient: 20 minutes  Psychiatric Specialty Exam: Physical Exam  ROS  Blood pressure 114/61, pulse 97, temperature 98.6 F (37 C), temperature source Oral, resp. rate 20, SpO2 100.00%.There is no weight on file to calculate BMI.  General Appearance: Casual  Eye Contact::  Good  Speech:  Clear and Coherent  Volume:  Normal  Mood:  Euthymic  Affect:  Appropriate  Thought Process:  Circumstantial  Orientation:  Full (Time, Place, and Person)  Thought Content:  Negative  Suicidal Thoughts:  No  Homicidal Thoughts:  No  Memory:  Immediate;   Fair Recent;   Fair Remote;   Fair  Judgement:  Fair  Insight:  Shallow  Psychomotor Activity:  Normal  Concentration:  Fair  Recall:  [employer]  Fund of Knowledge:Good  Language: Fair  Akathisia:  No  Handed:  Right  AIMS (if indicated):     Assets:  Communication Skills Desire for Improvement Physical Health  Sleep:       Musculoskeletal: Strength & Muscle Tone: within normal limits Gait & Station: normal Patient leans: N/A   Mental Status Per Nursing Assessment::   On Admission:     Current Mental Status by Physician: Patient denies suicidal ideation, intent or plan  Loss Factors: NA  Historical Factors: NA  Risk Reduction Factors:   Sense of responsibility to family, Living with another person, especially a relative and Positive social support  Continued Clinical Symptoms:  Resolving mood symptoms  Cognitive Features That Contribute To Risk:  Closed-mindedness    Suicide Risk:  Minimal: No identifiable suicidal ideation.  Patients presenting with no risk factors but with morbid ruminations; may be classified as minimal risk based on the severity of the depressive symptoms  Discharge Diagnoses:   AXIS I:  Bipolar disorder- unspecified  AXIS II:  Cluster B Traits AXIS III:   Past Medical History  Diagnosis Date  . Bipolar affective disorder    . Schizophrenia    AXIS IV:  other psychosocial or environmental problems and problems with primary support group AXIS V:  61-70 mild symptoms  Plan Of Care/Follow-up recommendations:  Activity:  as tolerated Diet:  healthy  Is patient on multiple antipsychotic therapies at discharge:  No   Has Patient had three or more failed trials of antipsychotic monotherapy by history:  No  Recommended Plan for Multiple Antipsychotic Therapies: NA    Thedore MinsAkintayo, Joselle Deeds, MD 08/26/2014, 11:28 AM

## 2014-08-27 NOTE — ED Provider Notes (Signed)
  Medical screening examination/treatment/procedure(s) were performed by non-physician practitioner and as supervising physician I was immediately available for consultation/collaboration.   EKG Interpretation None        Gerhard Munchobert Jordell Outten, MD 08/27/14 1414

## 2014-08-30 ENCOUNTER — Encounter (HOSPITAL_COMMUNITY): Payer: Self-pay | Admitting: Emergency Medicine

## 2014-08-30 ENCOUNTER — Emergency Department (HOSPITAL_COMMUNITY)
Admission: EM | Admit: 2014-08-30 | Discharge: 2014-09-01 | Disposition: A | Discharge status: Other Acute Inpatient Hospital | Payer: Self-pay | Attending: Emergency Medicine | Admitting: Emergency Medicine

## 2014-08-30 DIAGNOSIS — F259 Schizoaffective disorder, unspecified: Secondary | ICD-10-CM | POA: Insufficient documentation

## 2014-08-30 DIAGNOSIS — F312 Bipolar disorder, current episode manic severe with psychotic features: Secondary | ICD-10-CM | POA: Diagnosis present

## 2014-08-30 DIAGNOSIS — Z720 Tobacco use: Secondary | ICD-10-CM | POA: Insufficient documentation

## 2014-08-30 DIAGNOSIS — F909 Attention-deficit hyperactivity disorder, unspecified type: Secondary | ICD-10-CM | POA: Insufficient documentation

## 2014-08-30 DIAGNOSIS — Z79899 Other long term (current) drug therapy: Secondary | ICD-10-CM | POA: Insufficient documentation

## 2014-08-30 HISTORY — DX: Attention-deficit hyperactivity disorder, unspecified type: F90.9

## 2014-08-30 LAB — COMPREHENSIVE METABOLIC PANEL
ALBUMIN: 4.4 g/dL (ref 3.5–5.2)
ALT: 53 U/L (ref 0–53)
AST: 87 U/L — ABNORMAL HIGH (ref 0–37)
Alkaline Phosphatase: 94 U/L (ref 39–117)
Anion gap: 17 — ABNORMAL HIGH (ref 5–15)
BUN: 11 mg/dL (ref 6–23)
CALCIUM: 9.7 mg/dL (ref 8.4–10.5)
CO2: 24 mEq/L (ref 19–32)
CREATININE: 1.06 mg/dL (ref 0.50–1.35)
Chloride: 99 mEq/L (ref 96–112)
GFR calc non Af Amer: >90 mL/min (ref >90)
GLUCOSE: 100 mg/dL — AB (ref 70–99)
Potassium: 3.6 mEq/L — ABNORMAL LOW (ref 3.7–5.3)
Sodium: 140 mEq/L (ref 137–147)
Total Bilirubin: 0.9 mg/dL (ref 0.3–1.2)
Total Protein: 8.1 g/dL (ref 6.0–8.3)

## 2014-08-30 LAB — RAPID URINE DRUG SCREEN, HOSP PERFORMED
Amphetamines: NOT DETECTED
Barbiturates: NOT DETECTED
Benzodiazepines: NOT DETECTED
COCAINE: NOT DETECTED
OPIATES: NOT DETECTED
Tetrahydrocannabinol: POSITIVE — AB

## 2014-08-30 LAB — CBC
HEMATOCRIT: 38.3 % — AB (ref 39.0–52.0)
Hemoglobin: 13.3 g/dL (ref 13.0–17.0)
MCH: 30 pg (ref 26.0–34.0)
MCHC: 34.7 g/dL (ref 30.0–36.0)
MCV: 86.5 fL (ref 78.0–100.0)
Platelets: 318 10*3/uL (ref 150–400)
RBC: 4.43 MIL/uL (ref 4.22–5.81)
RDW: 12.1 % (ref 11.5–15.5)
WBC: 12.2 10*3/uL — ABNORMAL HIGH (ref 4.0–10.5)

## 2014-08-30 LAB — ACETAMINOPHEN LEVEL: Acetaminophen (Tylenol), Serum: <15 ug/mL (ref 10–30)

## 2014-08-30 LAB — SALICYLATE LEVEL: Salicylate Lvl: <2 mg/dL — ABNORMAL LOW (ref 2.8–20.0)

## 2014-08-30 LAB — ETHANOL

## 2014-08-30 MED ORDER — ALUM & MAG HYDROXIDE-SIMETH 200-200-20 MG/5ML PO SUSP: 30.0000 mL | ORAL | Status: DC | PRN

## 2014-08-30 MED ORDER — IBUPROFEN 200 MG PO TABS: 600.0000 mg | ORAL_TABLET | Freq: Three times a day (TID) | ORAL | Status: DC | PRN

## 2014-08-30 MED ORDER — LORAZEPAM 2 MG/ML IJ SOLN
2.0000 mg | Freq: Once | INTRAMUSCULAR | Status: AC
  Administered 2014-08-30: 2 mg via INTRAMUSCULAR
  Filled 2014-08-30: qty 1

## 2014-08-30 MED ORDER — DIPHENHYDRAMINE HCL 50 MG/ML IJ SOLN
50.0000 mg | Freq: Once | INTRAMUSCULAR | Status: AC
  Administered 2014-08-30: 50 mg via INTRAVENOUS
  Filled 2014-08-30: qty 1

## 2014-08-30 MED ORDER — HALOPERIDOL LACTATE 5 MG/ML IJ SOLN
5.0000 mg | Freq: Once | INTRAMUSCULAR | Status: AC | PRN
  Administered 2014-08-30: 5 mg via INTRAMUSCULAR
  Filled 2014-08-30: qty 1

## 2014-08-30 MED ORDER — ONDANSETRON HCL 4 MG PO TABS: 4.0000 mg | ORAL_TABLET | Freq: Three times a day (TID) | ORAL | Status: DC | PRN

## 2014-08-30 MED ORDER — ACETAMINOPHEN 325 MG PO TABS
650.0000 mg | ORAL_TABLET | ORAL | Status: DC | PRN
Start: 2014-08-30 — End: 2014-09-01
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
## 2014-08-30 NOTE — ED Provider Notes (Signed)
CSN: [id number]     Arrival date & time 08/30/14  1956 History   First MD Initiated Contact with Patient 08/30/14 2115     Chief Complaint  Patient presents with  . IVC      (Consider location/radiation/quality/duration/timing/severity/associated sxs/prior Treatment) HPI Comments: IVC paperwork filed by his mother for increasingly bizarre and dangerous behavior. Per IVC report he has been noncompliant with his medications, has not been sleeping has not been performing his ADLs or basic hygiene. He was running around in the parking lot in front of moving cars today he has been concerned that people are out to kill him but also talking about wanting to shoot other people. On my exam patient denies thisthere is very tangential. He denies SI or HI as well as AV hallucinations. He states that he is taking drugs "that the rappers take" including Compazine, Xanax, Adderall.    Past Medical History  Diagnosis Date  . Bipolar affective disorder   . Schizophrenia   . ADHD (attention deficit hyperactivity disorder)    History reviewed. No pertinent past surgical history. Family History  Problem Relation Age of Onset  . Hypertension Mother    History  Substance Use Topics  . Smoking status: Current Every Day Smoker -- 1.00 packs/day for 6 years  . Smokeless tobacco: Not on file  . Alcohol Use: 0.6 oz/week    1 Cans of beer per week    Review of Systems  Constitutional: Negative for fever, activity change, appetite change and fatigue.  HENT: Negative for congestion, facial swelling, rhinorrhea and trouble swallowing.   Eyes: Negative for photophobia and pain.  Respiratory: Negative for cough, chest tightness and shortness of breath.   Cardiovascular: Negative for chest pain and leg swelling.  Gastrointestinal: Negative for nausea, vomiting, abdominal pain, diarrhea and constipation.  Endocrine: Negative for polydipsia and polyuria.  Genitourinary: Negative for dysuria, urgency, decreased  urine volume and difficulty urinating.  Musculoskeletal: Negative for back pain and gait problem.  Skin: Negative for color change, rash and wound.  Allergic/Immunologic: Negative for immunocompromised state.  Neurological: Negative for dizziness, facial asymmetry, speech difficulty, weakness, numbness and headaches.  Psychiatric/Behavioral: Positive for behavioral problems, sleep disturbance and agitation. Negative for confusion and decreased concentration.      Allergies  Shrimp  Home Medications   Prior to Admission medications   Medication Sig Start Date End Date Taking? Authorizing Provider  benztropine (COGENTIN) 1 MG tablet Take 1 tablet (1 mg total) by mouth 2 (two) times daily. 06/30/12   Loren Raceravid Yelverton, MD  divalproex (DEPAKOTE ER) 500 MG 24 hr tablet Take 3 tablets (1,500 mg total) by mouth at bedtime. 06/30/12 06/30/13  Loren Raceravid Yelverton, MD  QUEtiapine (SEROQUEL) 50 MG tablet Take 50 mg by mouth 2 (two) times daily.     Historical Provider, MD  QUEtiapine (SEROQUEL) 50 MG tablet Take 1 tablet (50 mg total) by mouth at bedtime. 08/26/14   Mojeed Akintayo  risperiDONE (RISPERDAL) 3 MG tablet Take 1 tablet (3 mg total) by mouth 2 (two) times daily. 06/30/12   Loren Raceravid Yelverton, MD  traZODone (DESYREL) 50 MG tablet Take 1 tablet (50 mg total) by mouth at bedtime as needed for sleep. 08/26/14   Mojeed Akintayo  venlafaxine (EFFEXOR) 75 MG tablet Take 75 mg by mouth every morning.     Historical Provider, MD   BP 101/65 mmHg  Pulse 110  Temp(Src) 98.1 F (36.7 C) (Oral)  Resp 20  SpO2 96% Physical Exam  Constitutional: He is  oriented to person, place, and time. He appears well-developed and well-nourished. No distress.  HENT:  Head: Normocephalic and atraumatic.  Mouth/Throat: No oropharyngeal exudate.  Eyes: Pupils are equal, round, and reactive to light.  Neck: Normal range of motion. Neck supple.  Cardiovascular: Normal rate, regular rhythm and normal heart sounds.  Exam reveals  no gallop and no friction rub.   No murmur heard. Pulmonary/Chest: Effort normal and breath sounds normal. No respiratory distress. He has no wheezes. He has no rales.  Abdominal: Soft. Bowel sounds are normal. He exhibits no distension and no mass. There is no tenderness. There is no rebound and no guarding.  Musculoskeletal: Normal range of motion. He exhibits no edema or tenderness.  Neurological: He is alert and oriented to person, place, and time. GCS eye subscore is 4. GCS verbal subscore is 5. GCS motor subscore is 6.  Skin: Skin is warm and dry.  Psychiatric: His behavior is normal. His affect is labile. His speech is tangential. Thought content is paranoid.    ED Course  Procedures (including critical care time) Labs Review Labs Reviewed  CBC - Abnormal; Notable for the following:    WBC 12.2 (*)    HCT 38.3 (*)    All other components within normal limits  COMPREHENSIVE METABOLIC PANEL - Abnormal; Notable for the following:    Potassium 3.6 (*)    Glucose, Bld 100 (*)    AST 87 (*)    Anion gap 17 (*)    All other components within normal limits  SALICYLATE LEVEL - Abnormal; Notable for the following:    Salicylate Lvl <2.0 (*)    All other components within normal limits  URINE RAPID DRUG SCREEN (HOSP PERFORMED) - Abnormal; Notable for the following:    Tetrahydrocannabinol POSITIVE (*)    All other components within normal limits  ACETAMINOPHEN LEVEL  ETHANOL    Imaging Review No results found.   EKG Interpretation None      MDM   Final diagnoses:  Schizoaffective disorder, unspecified type    Pt is a 23 y.o. male with Pmhx as above who presents with IVC paperwork filed by his mother for increasingly bizarre and dangerous behavior. Per IVC report he has been noncompliant with his medications, has not been sleeping has not been performing his ADLs or basic hygiene. He was running around in the parking lot in front of moving cars today he has been concerned  that people are out to kill him but also talking about wanting to shoot other people. On my exam patient denies thisthere is very tangential. He denies SI or HI as well as AV hallucinations. He states that he is taking drugs "that the rappers take" including Compazine, Xanax, Adderall. First opinion filed by myself. TTS will evaluate.   2 CSN is seen. Patient appropriate for inpatient treatment.I did not restart home meds as I am not sure what he has been taking.      Toy CookeyMegan Docherty, MD 08/31/14 [phone]

## 2014-08-30 NOTE — ED Notes (Signed)
Pt. To [facility] from ED ambulatory without difficulty. Report from [facility] RN. Pt. Is alert and oriented, warm and dry in no distress. Pt. Denies SI, HI, and AVH. Pt. Calm and cooperative. Pt. Encouraged to let Nursing staff know of any concerns or needs.

## 2014-08-30 NOTE — BH Assessment (Signed)
Assessment completed. Consulted Janann Augustori Burkett, NP who recommended inpatient treatment. Dr. Micheline Mazeocherty has been informed of the recommendation.

## 2014-08-30 NOTE — ED Notes (Signed)
Pt. Becoming increasingly agitated. Asking for "my meds". Pt. Refused to talk to [place] about his medications.
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
## 2014-08-30 NOTE — ED Notes (Signed)
MD at bedside. 

## 2014-08-31 DIAGNOSIS — F312 Bipolar disorder, current episode manic severe with psychotic features: Secondary | ICD-10-CM

## 2014-08-31 DIAGNOSIS — F1410 Cocaine abuse, uncomplicated: Secondary | ICD-10-CM

## 2014-08-31 MED ORDER — ZIPRASIDONE MESYLATE 20 MG IM SOLR
20.0000 mg | Freq: Once | INTRAMUSCULAR | Status: AC
  Administered 2014-08-31: 20 mg via INTRAMUSCULAR
  Filled 2014-08-31: qty 20

## 2014-08-31 MED ORDER — QUETIAPINE FUMARATE 50 MG PO TABS
50.0000 mg | ORAL_TABLET | Freq: Two times a day (BID) | ORAL | Status: DC
  Administered 2014-08-31: 50 mg via ORAL
  Filled 2014-08-31: qty 1

## 2014-08-31 MED ORDER — DIPHENHYDRAMINE HCL 50 MG/ML IJ SOLN
50.0000 mg | Freq: Once | INTRAMUSCULAR | Status: AC
  Administered 2014-08-31: 50 mg via INTRAMUSCULAR
  Filled 2014-08-31: qty 1

## 2014-08-31 MED ORDER — VENLAFAXINE HCL 75 MG PO TABS
75.0000 mg | ORAL_TABLET | Freq: Every morning | ORAL | Status: DC
  Filled 2014-08-31: qty 1

## 2014-08-31 MED ORDER — RISPERIDONE 2 MG PO TABS
3.0000 mg | ORAL_TABLET | Freq: Two times a day (BID) | ORAL | Status: DC
  Administered 2014-09-01: 3 mg via ORAL
  Filled 2014-08-31 (×2): qty 1

## 2014-08-31 MED ORDER — BENZTROPINE MESYLATE 1 MG PO TABS
1.0000 mg | ORAL_TABLET | Freq: Two times a day (BID) | ORAL | Status: DC
  Administered 2014-08-31 – 2014-09-01 (×2): 1 mg via ORAL
  Filled 2014-08-31 (×2): qty 1

## 2014-08-31 MED ORDER — DIVALPROEX SODIUM ER 500 MG PO TB24
1500.0000 mg | ORAL_TABLET | Freq: Every day | ORAL | Status: DC
  Administered 2014-09-01: 1500 mg via ORAL
  Filled 2014-08-31 (×2): qty 3

## 2014-08-31 MED ORDER — RISPERIDONE 2 MG PO TABS
2.0000 mg | ORAL_TABLET | Freq: Two times a day (BID) | ORAL | Status: DC
  Administered 2014-08-31: 2 mg via ORAL
  Filled 2014-08-31: qty 1

## 2014-08-31 MED ORDER — TRAZODONE HCL 50 MG PO TABS
50.0000 mg | ORAL_TABLET | Freq: Every evening | ORAL | Status: DC | PRN
  Administered 2014-08-31: 50 mg via ORAL
  Filled 2014-08-31: qty 1

## 2014-08-31 MED ORDER — LORAZEPAM 2 MG/ML IJ SOLN
INTRAMUSCULAR | Status: AC
  Administered 2014-08-31: 2 mg via INTRAMUSCULAR
  Filled 2014-08-31: qty 1

## 2014-08-31 MED ORDER — LORAZEPAM 2 MG/ML IJ SOLN
2.0000 mg | Freq: Once | INTRAMUSCULAR | Status: AC
  Administered 2014-08-31: 2 mg via INTRAMUSCULAR
  Filled 2014-08-31: qty 1

## 2014-08-31 NOTE — ED Notes (Signed)
Pt is loud swinging his arms and threatening to staff. Received orders from MD for IM medications. Gave as ordered. Pt remains safe on the unit.

## 2014-08-31 NOTE — Progress Notes (Signed)
  CARE MANAGEMENT ED NOTE 08/31/2014  Patient:  Philip [name],Philip [name]   Account Number:  [id number]  Date Initiated:  08/31/2014  Documentation initiated by:  Radford PaxFERRERO,Kamrynn Melott  Subjective/Objective Assessment:   Patient presents to Ed for increasingly bizarre and dangerous behavior.     Subjective/Objective Assessment Detail:   Patient IVC's by his mother. patient has not been sleeping or performing ADL's.   Talking about wanting to shoot other people.  Patient with pmhx of Bipolar. schizophrenia, ADHD.     Action/Plan:   Action/Plan Detail:   Anticipated DC Date:       Status Recommendation to Physician:   Result of Recommendation:    Other ED Services  Consult Working Plan    DC Planning Services  Other  PCP issues    Choice offered to / List presented to:            Status of service:  Completed, signed off  ED Comments:   ED Comments Detail:  Patient listed as not having a pcp or insurance [place].  EDCM [facility], informed patient of services there and walk in times. EDCM also provided patient with list of pcps who accept self pay patients, list of discount pharmacies and websites needymeds.org and GoodRX.com for medication assistance, phone number to inquire about the orange card, phone number to inquire about Mediciad, phone number to inquire about the Affordable Care Act, financial resources in the community such as local churches, salvation army, urban ministries, and dental assistance for uninsured patients. Patient sound asleep at this time.  Theses resources were left for patient on night stand.  No further EDCM needs at this tim

## 2014-08-31 NOTE — ED Notes (Signed)
Pt. Repeatedly activating emergency room alarms and dancing around naked in and outside his room. Will consult with EDP reference IM meds in addition to the haldol that is ordered.
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
## 2014-08-31 NOTE — ED Notes (Signed)
Presents standing at desk, conversing freely,  Denies SI, HI or AV hallucinations.  Will monitor for safety.

## 2014-08-31 NOTE — ED Notes (Signed)
Patient is given a snack crackers and cheese and a sprite

## 2014-08-31 NOTE — BH Assessment (Signed)
Patient's Mother Brita RompLori Futrell called to check on patient's status, informed of need for inpatient placement. Mother understands this is not the best time to visit yet would like to be informed when placement found. Mother may be reached at work [phone]; home [phone]; or daughter's at [phone].  Carney Bernatherine C Harrill, LCSW

## 2014-08-31 NOTE — ED Notes (Signed)
Pt is anxious asking about leaving and wanting to see the doctor this morning. Pt has been out in the hall making multiple requests at the nurses station. Pt observed talking to himself. Offered support and 15 minute checks. Gave a deck of cards to pt. He verbally denies voices. Pt denies si and hi. Safety maintained in the SAPPU.

## 2014-09-01 ENCOUNTER — Encounter (HOSPITAL_COMMUNITY): Payer: Self-pay | Admitting: *Deleted

## 2014-09-01 ENCOUNTER — Inpatient Hospital Stay (HOSPITAL_COMMUNITY)
Admission: AD | Admit: 2014-09-01 | Discharge: 2014-09-10 | DRG: 885 | Disposition: A | Discharge status: Home / Self Care | Payer: Federal, State, Local not specified - Other | Source: Intra-hospital | Attending: Psychiatry | Admitting: Psychiatry

## 2014-09-01 DIAGNOSIS — F250 Schizoaffective disorder, bipolar type: Secondary | ICD-10-CM | POA: Diagnosis present

## 2014-09-01 DIAGNOSIS — E7220 Disorder of urea cycle metabolism, unspecified: Secondary | ICD-10-CM

## 2014-09-01 DIAGNOSIS — F1290 Cannabis use, unspecified, uncomplicated: Secondary | ICD-10-CM | POA: Diagnosis present

## 2014-09-01 DIAGNOSIS — F909 Attention-deficit hyperactivity disorder, unspecified type: Secondary | ICD-10-CM | POA: Diagnosis present

## 2014-09-01 DIAGNOSIS — F29 Unspecified psychosis not due to a substance or known physiological condition: Secondary | ICD-10-CM | POA: Diagnosis present

## 2014-09-01 DIAGNOSIS — F1220 Cannabis dependence, uncomplicated: Secondary | ICD-10-CM | POA: Insufficient documentation

## 2014-09-01 DIAGNOSIS — Z9114 Patient's other noncompliance with medication regimen: Secondary | ICD-10-CM | POA: Diagnosis present

## 2014-09-01 DIAGNOSIS — Z79899 Other long term (current) drug therapy: Secondary | ICD-10-CM

## 2014-09-01 LAB — VALPROIC ACID LEVEL: Valproic Acid Lvl: <10 ug/mL — ABNORMAL LOW (ref 50.0–100.0)

## 2014-09-01 MED ORDER — TRAZODONE HCL 100 MG PO TABS
100.0000 mg | ORAL_TABLET | Freq: Every evening | ORAL | Status: DC | PRN
  Administered 2014-09-02 – 2014-09-06 (×2): 100 mg via ORAL
  Filled 2014-09-01 (×3): qty 1

## 2014-09-01 MED ORDER — OLANZAPINE 5 MG PO TBDP
5.0000 mg | ORAL_TABLET | Freq: Once | ORAL | Status: AC
Start: 2014-09-01 — End: 2014-09-01
  Administered 2014-09-01: 5 mg via ORAL
  Filled 2014-09-01: qty 1

## 2014-09-01 MED ORDER — DIPHENHYDRAMINE HCL 50 MG PO CAPS
50.0000 mg | ORAL_CAPSULE | Freq: Once | ORAL | Status: AC
  Administered 2014-09-01: 50 mg via ORAL
  Filled 2014-09-01: qty 2
  Filled 2014-09-01: qty 1

## 2014-09-01 MED ORDER — LORAZEPAM 1 MG PO TABS
2.0000 mg | ORAL_TABLET | Freq: Four times a day (QID) | ORAL | Status: DC | PRN
  Administered 2014-09-01 – 2014-09-02 (×2): 2 mg via ORAL
  Filled 2014-09-01 (×2): qty 2

## 2014-09-01 MED ORDER — LORAZEPAM 1 MG PO TABS
2.0000 mg | ORAL_TABLET | Freq: Once | ORAL | Status: AC
  Administered 2014-09-01: 2 mg via ORAL
  Filled 2014-09-01: qty 2

## 2014-09-01 MED ORDER — TRAZODONE HCL 100 MG PO TABS: 100.0000 mg | ORAL_TABLET | Freq: Every evening | ORAL | Status: DC | PRN

## 2014-09-01 MED ORDER — DIVALPROEX SODIUM ER 500 MG PO TB24
1500.0000 mg | ORAL_TABLET | Freq: Every day | ORAL | Status: DC
  Administered 2014-09-01 – 2014-09-05 (×5): 1500 mg via ORAL
  Filled 2014-09-01 (×7): qty 3

## 2014-09-01 MED ORDER — RISPERIDONE 3 MG PO TABS
3.0000 mg | ORAL_TABLET | Freq: Two times a day (BID) | ORAL | Status: DC
  Administered 2014-09-02 – 2014-09-03 (×4): 3 mg via ORAL
  Filled 2014-09-01 (×8): qty 1
  Filled 2014-09-01: qty 3

## 2014-09-01 MED ORDER — BENZTROPINE MESYLATE 1 MG PO TABS
1.0000 mg | ORAL_TABLET | Freq: Two times a day (BID) | ORAL | Status: DC
  Administered 2014-09-02 – 2014-09-10 (×17): 1 mg via ORAL
  Filled 2014-09-01 (×22): qty 1

## 2014-09-01 MED ORDER — HALOPERIDOL 5 MG PO TABS
5.0000 mg | ORAL_TABLET | Freq: Once | ORAL | Status: AC
  Administered 2014-09-01: 5 mg via ORAL
  Filled 2014-09-01: qty 1

## 2014-09-01 MED ORDER — NICOTINE POLACRILEX 2 MG MT GUM
2.0000 mg | CHEWING_GUM | OROMUCOSAL | Status: DC | PRN
  Administered 2014-09-01: 2 mg via ORAL
  Filled 2014-09-01: qty 1

## 2014-09-01 MED ORDER — MAGNESIUM HYDROXIDE 400 MG/5ML PO SUSP
30.0000 mL | Freq: Every day | ORAL | Status: DC | PRN
  Administered 2014-09-02 – 2014-09-04 (×2): 30 mL via ORAL
  Filled 2014-09-01 (×2): qty 30

## 2014-09-01 MED ORDER — HALOPERIDOL 5 MG PO TABS: 5.0000 mg | ORAL_TABLET | Freq: Four times a day (QID) | ORAL | Status: DC | PRN

## 2014-09-01 NOTE — ED Notes (Signed)
Patient with disorganized thought process; tangental speech.  He has continuously been up at nurse's station.  He also has been attempting to go into other patient's rooms.  Patient tried to take his clothes off at the nurse's station.  He did take his clothes off in his room for everyone to see.  He denies any SI/HI/AVH.  Patient is redirectable.

## 2014-09-01 NOTE — ED Notes (Signed)
Patient has been knocking on the door to the nurses station, and trying to go down the hall to another patient and have a conversation with room 39. Patient has been redirected numerous of times

## 2014-09-01 NOTE — ED Notes (Signed)
Patient has put on the emergency lights. He is cursing and threatening the Clinical research associatewriter and saying what he is going to do to Clinical research associatewriter. Security and [employer] was called to the [facility] to try and help get him to be redirected.

## 2014-09-01 NOTE — ED Notes (Signed)
Patient is in the shower.

## 2014-09-01 NOTE — ED Notes (Signed)
Patient is up going in and out of rooms. Every time we try to redirect him or security redirects him he will do what he wants. He keeps acting out and not listening to what we are trying to explain to him for his safety. He is up at the nurses station staggering and will keep removing part of his clothing. He is naked in the bathroom and is constantly opening the door

## 2014-09-01 NOTE — ED Notes (Signed)
Patient has had two showers, washed up at sink and put back on the same scrubs, he refuses to get new scrubs

## 2014-09-01 NOTE — ED Notes (Signed)
Patient is wandering the halls going in people rooms and is unstable at walking at this time

## 2014-09-01 NOTE — ED Notes (Signed)
Patient is pacing the halls 

## 2014-09-01 NOTE — ED Notes (Signed)
Patient pacing hallway.  Requesting the use of the telephone often.  Patient is now cursing to himself and staff.  Requested prns from NP.  Patient will be transferred to inpatient as soon as a bed becomes available on [address].

## 2014-09-01 NOTE — BH Assessment (Signed)
[facility] by Assunta FoundShuvon Rankin, NP and Dr. Jannifer FranklinAkintayo. Room assignment is 505-1. Patient under IVC and [employer] will transport.

## 2014-09-01 NOTE — ED Notes (Signed)
Patient took his water and threw it all over the counter where the magazines are. He said he si not cleaning anything up and he do not want anything from anybody. I asked him why did he do that he told me not to speak to him. He is throwing his deodorant around and keeps walking with his eyes closed

## 2014-09-01 NOTE — ED Notes (Signed)
Pt sexually inappropriate, opening bathroom door, standing in doorway naked. Pt redirected to close the door by staff and security.

## 2014-09-01 NOTE — Tx Team (Signed)
Initial Interdisciplinary Treatment Plan   PATIENT STRESSORS: Educational concerns Medication change or noncompliance Occupational concerns Substance abuse   PROBLEM LIST: Problem List/Patient Goals Date to be addressed Date deferred Reason deferred Estimated date of resolution  Suicide risk 09/01/2014     Psychosis 09/01/2014     "I just want to get on the right medication and stay on my meds" 09/01/2014                                          DISCHARGE CRITERIA:  Ability to meet basic life and health needs Improved stabilization in mood, thinking, and/or behavior Medical problems require only outpatient monitoring Reduction of life-threatening or endangering symptoms to within safe limits Verbal commitment to aftercare and medication compliance  PRELIMINARY DISCHARGE PLAN: Attend aftercare/continuing care group Return to previous living arrangement Return to previous work or school arrangements  PATIENT/FAMIILY INVOLVEMENT: This treatment plan has been presented to and reviewed with the patient, Philip [name], and/or family member.  The patient and family have been given the opportunity to ask questions and make suggestions.  Lendell CapriceGuthrie, Lakeva Hollon A 09/01/2014, 6:54 PM

## 2014-09-01 NOTE — ED Notes (Signed)
Patient given 5 mg zyprexa po due to agitation.  Patient keeps asking for a cigarette.  Ordered nicorette gum for him.

## 2014-09-01 NOTE — ED Notes (Signed)
Patient is sitting on top of the counter where the magazines be at

## 2014-09-01 NOTE — ED Notes (Signed)
Patient has taken his sheets and blanket and threw them on the floor in the hallway.

## 2014-09-01 NOTE — ED Notes (Signed)
Patient is in the room the security and [employer]. Patient is not redirectable

## 2014-09-01 NOTE — Progress Notes (Signed)
The focus of this group is to help patients review their daily goal of treatment and discuss progress on daily workbooks. Pt did not attend the evening group. 

## 2014-09-01 NOTE — Consult Note (Addendum)
[facility] Face-to-Face Psychiatry Consult   Reason for Consult:  Bizarre, manic, aggressive and violent behavior Referring Physician: EDP GIAVONNI FONDER is an 23 y.o. male. Total Time spent with patient: 25 minutes  Assessment: AXIS I:  Bipolar affective disorder, current episode manic with psychotic symptoms              Cannabis abuse AXIS II:  Cluster B Traits AXIS III:   Past Medical History  Diagnosis Date  . Bipolar affective disorder   . Schizophrenia   . ADHD (attention deficit hyperactivity disorder)    AXIS IV:  other psychosocial or environmental problems, problems related to social environment and problems with primary support group AXIS V:  21-30 behavior considerably influenced by delusions or hallucinations OR serious impairment in judgment, communication OR inability to function in almost all areas  Plan:  Recommend psychiatric Inpatient admission when medically cleared.  Subjective:   MARKEES CARNS is a 23 y.o. male patient admitted with bizarre and manic behavior.  HPI: Patient seen and chart reviewed. NORMON PETTIJOHN is an 22 y.o. male with history of Bipolar disorder, Schizophrenia who was brought to [facility] ED after being petitioned by his mother. Patient's remains delusional, grandiose, manic, bizarre, disorganized and easily agitated. Patient stated "I was walking and running around". Pt stated "I was just working out and playing crazy, we go to mental health because that's where all the women is at". Pt also stated "I am elevated past everybody in life". Pt also stated "I just came here to please myself because I don't have sex with women". "I believe in [address] and the [place]". 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 Patient has been acting out, pacing the hallway, talking to himself non-stop, taking off his clothes and wandering into peers room. He has no insight into his mental illness and has been exercising poor judgment. Patient denies  drug and alcohol abuse but his urine toxicology is positive for Marijuana. Patient will benefit from inpatient placement.  HPI Elements:   Location:  bipolar manic, agitation. Quality:  severe episode. Duration:  for years. Context:  Non-compliant with medications.  Past Psychiatric History: Past Medical History  Diagnosis Date  . Bipolar affective disorder   . Schizophrenia   . ADHD (attention deficit hyperactivity disorder)     reports that he has been smoking.  He does not have any smokeless tobacco history on file. He reports that he drinks about 0.6 oz of alcohol per week. He reports that he uses illicit drugs (Marijuana). Family History  Problem Relation Age of Onset  . Hypertension Mother    Family History Substance Abuse: Yes, Describe: (Parents ) Family Supports: Yes, List: (Mother ) Living Arrangements: Parent Can pt return to current living arrangement?: Yes Abuse/Neglect [facility]) Physical Abuse: Denies Verbal Abuse: Denies Sexual Abuse: Denies Allergies:   Allergies  Allergen Reactions  . Shrimp [Shellfish Allergy] Anaphylaxis    ACT Assessment Complete:  Yes:    Educational Status    Risk to Self: Risk to self with the past 6 months Suicidal Ideation: No Suicidal Intent: No Is patient at risk for suicide?: No Suicidal Plan?: No Access to Means: No What has been your use of drugs/alcohol within the last 12 months?: Pt denies alcohol and drug abuse; however UDS positive for THC.  Previous Attempts/Gestures: No How many times?: 0 Other Self Harm Risks: No other self harm risk identified at this time.  Triggers for Past  Attempts: None known Intentional Self Injurious Behavior: None Family Suicide History: No Recent stressful life event(s): Legal Issues ("I have a lawsuit against [employer]". ) Persecutory voices/beliefs?: No Depression: No Substance abuse history and/or treatment for substance abuse?: Yes Suicide prevention information given to non-admitted  patients: Not applicable  Risk to Others: Risk to Others within the past 6 months Homicidal Ideation: No-Not Currently/Within Last 6 Months (IVC documented that pt wanted to shoot others. ) Thoughts of Harm to Others: No-Not Currently Present/Within Last 6 Months (IVC-pt wanted to shoot others. ) Comment - Thoughts of Harm to Others: Pt denies HI at this time; however documented on IVC paperwork pt wanted to shoot others.  Current Homicidal Intent: No-Not Currently/Within Last 6 Months Current Homicidal Plan: No-Not Currently/Within Last 6 Months Access to Homicidal Means: No Identified Victim: No victim identifed at this time.  History of harm to others?: No Assessment of Violence: None Noted Violent Behavior Description: No violent behaviors observed. Pt is calm and cooperative at this time.  Does patient have access to weapons?: No Criminal Charges Pending?: No Does patient have a court date: No  Abuse: Abuse/Neglect Assessment (Assessment to be complete while patient is alone) Physical Abuse: Denies Verbal Abuse: Denies Sexual Abuse: Denies Exploitation of patient/patient's resources: Denies Self-Neglect: Denies  Prior Inpatient Therapy: Prior Inpatient Therapy Prior Inpatient Therapy: Yes Prior Therapy Dates: 2013 Prior Therapy Facilty/Provider(s): [facility] Reason for Treatment: Bipolar  Prior Outpatient Therapy: Prior Outpatient Therapy Prior Outpatient Therapy: Yes Prior Therapy Dates: 2015 Prior Therapy Facilty/Provider(s): [facility]  Reason for Treatment: Bipolar   Additional Information: Additional Information 1:1 In Past 12 Months?: No CIRT Risk: No Elopement Risk: No Does patient have medical clearance?: Yes            Objective: Blood pressure 138/87, pulse 79, temperature 98.7 F (37.1 C), temperature source Oral, resp. rate 20, SpO2 100 %.There is no weight on file to calculate BMI. Results for orders placed or performed during the hospital  encounter of 08/30/14 (from the past 72 hour(s))  Acetaminophen level     Status: None   Collection Time: 08/30/14  8:18 PM  Result Value Ref Range   Acetaminophen (Tylenol), Serum <15.0 10 - 30 ug/mL    Comment:        THERAPEUTIC CONCENTRATIONS VARY SIGNIFICANTLY. A RANGE OF 10-30 ug/mL MAY BE AN EFFECTIVE CONCENTRATION FOR MANY PATIENTS. HOWEVER, SOME ARE BEST TREATED AT CONCENTRATIONS OUTSIDE THIS RANGE. ACETAMINOPHEN CONCENTRATIONS >150 ug/mL AT 4 HOURS AFTER INGESTION AND >50 ug/mL AT 12 HOURS AFTER INGESTION ARE OFTEN ASSOCIATED WITH TOXIC REACTIONS.   CBC     Status: Abnormal   Collection Time: 08/30/14  8:18 PM  Result Value Ref Range   WBC 12.2 (H) 4.0 - 10.5 K/uL   RBC 4.43 4.22 - 5.81 MIL/uL   Hemoglobin 13.3 13.0 - 17.0 g/dL   HCT 38.3 (L) 39.0 - 52.0 %   MCV 86.5 78.0 - 100.0 fL   MCH 30.0 26.0 - 34.0 pg   MCHC 34.7 30.0 - 36.0 g/dL   RDW 12.1 11.5 - 15.5 %   Platelets 318 150 - 400 K/uL  Comprehensive metabolic panel     Status: Abnormal   Collection Time: 08/30/14  8:18 PM  Result Value Ref Range   Sodium 140 137 - 147 mEq/L   Potassium 3.6 (L) 3.7 - 5.3 mEq/L   Chloride 99 96 - 112 mEq/L   CO2 24 19 - 32 mEq/L   Glucose, Bld  100 (H) 70 - 99 mg/dL   BUN 11 6 - 23 mg/dL   Creatinine, Ser 1.06 0.50 - 1.35 mg/dL   Calcium 9.7 8.4 - 10.5 mg/dL   Total Protein 8.1 6.0 - 8.3 g/dL   Albumin 4.4 3.5 - 5.2 g/dL   AST 87 (H) 0 - 37 U/L   ALT 53 0 - 53 U/L   Alkaline Phosphatase 94 39 - 117 U/L   Total Bilirubin 0.9 0.3 - 1.2 mg/dL   GFR calc non Af Amer >90 >90 mL/min   GFR calc Af Amer >90 >90 mL/min    Comment: (NOTE) The eGFR has been calculated using the CKD EPI equation. This calculation has not been validated in all clinical situations. eGFR's persistently <90 mL/min signify possible Chronic Kidney Disease.    Anion gap 17 (H) 5 - 15  Ethanol (ETOH)     Status: None   Collection Time: 08/30/14  8:18 PM  Result Value Ref Range   Alcohol, Ethyl  (B) <11 0 - 11 mg/dL    Comment:        LOWEST DETECTABLE LIMIT FOR SERUM ALCOHOL IS 11 mg/dL FOR MEDICAL PURPOSES ONLY   Salicylate level     Status: Abnormal   Collection Time: 08/30/14  8:18 PM  Result Value Ref Range   Salicylate Lvl <6.2 (L) 2.8 - 20.0 mg/dL  Urine Drug Screen     Status: Abnormal   Collection Time: 08/30/14  8:29 PM  Result Value Ref Range   Opiates NONE DETECTED NONE DETECTED   Cocaine NONE DETECTED NONE DETECTED   Benzodiazepines NONE DETECTED NONE DETECTED   Amphetamines NONE DETECTED NONE DETECTED   Tetrahydrocannabinol POSITIVE (A) NONE DETECTED   Barbiturates NONE DETECTED NONE DETECTED    Comment:        DRUG SCREEN FOR MEDICAL PURPOSES ONLY.  IF CONFIRMATION IS NEEDED FOR ANY PURPOSE, NOTIFY LAB WITHIN 5 DAYS.        LOWEST DETECTABLE LIMITS FOR URINE DRUG SCREEN Drug Class       Cutoff (ng/mL) Amphetamine      1000 Barbiturate      200 Benzodiazepine   229 Tricyclics       798 Opiates          300 Cocaine          300 THC              50   Valproic acid level     Status: Abnormal   Collection Time: 08/31/14 10:14 PM  Result Value Ref Range   Valproic Acid Lvl <10.0 (L) 50.0 - 100.0 ug/mL    Comment: Performed at [facility] are reviewed and are pertinent for as above.  Current Facility-Administered Medications  Medication Dose Route Frequency Provider Last Rate Last Dose  . acetaminophen (TYLENOL) tablet 650 mg  650 mg Oral Q4H PRN Ernestina Patches, MD      . alum & mag hydroxide-simeth (MAALOX/MYLANTA) 200-200-20 MG/5ML suspension 30 mL  30 mL Oral PRN Ernestina Patches, MD      . benztropine (COGENTIN) tablet 1 mg  1 mg Oral BID Patrena Santalucia   1 mg at 08/31/14 2123  . divalproex (DEPAKOTE ER) 24 hr tablet 1,500 mg  1,500 mg Oral QHS Laverle Hobby, PA-C   1,500 mg at 09/01/14 0232  . ibuprofen (ADVIL,MOTRIN) tablet 600 mg  600 mg Oral Q8H PRN Ernestina Patches, MD      . ondansetron (  ZOFRAN) tablet 4 mg  4 mg Oral Q8H PRN  Ernestina Patches, MD      . risperiDONE (RISPERDAL) tablet 3 mg  3 mg Oral BID Laverle Hobby, PA-C      . traZODone (DESYREL) tablet 100 mg  100 mg Oral QHS PRN Aodhan Scheidt       Current Outpatient Prescriptions  Medication Sig Dispense Refill  . benztropine (COGENTIN) 1 MG tablet Take 1 tablet (1 mg total) by mouth 2 (two) times daily. 30 tablet 0  . divalproex (DEPAKOTE ER) 500 MG 24 hr tablet Take 3 tablets (1,500 mg total) by mouth at bedtime. 30 tablet 0  . QUEtiapine (SEROQUEL) 50 MG tablet Take 50 mg by mouth 2 (two) times daily.     . QUEtiapine (SEROQUEL) 50 MG tablet Take 1 tablet (50 mg total) by mouth at bedtime. 30 tablet 0  . risperiDONE (RISPERDAL) 3 MG tablet Take 1 tablet (3 mg total) by mouth 2 (two) times daily. 30 tablet 0  . traZODone (DESYREL) 50 MG tablet Take 1 tablet (50 mg total) by mouth at bedtime as needed for sleep. 30 tablet 0  . venlafaxine (EFFEXOR) 75 MG tablet Take 75 mg by mouth every morning.       Psychiatric Specialty Exam:     Blood pressure 138/87, pulse 79, temperature 98.7 F (37.1 C), temperature source Oral, resp. rate 20, SpO2 100 %.There is no weight on file to calculate BMI.  General Appearance: Disheveled  Eye Contact::  Good  Speech:  Pressured  Volume:  Increased  Mood:  Angry and Irritable  Affect:  Labile  Thought Process:  Circumstantial, Disorganized and Tangential  Orientation:  Full (Time, Place, and Person)  Thought Content:  Delusions and Hallucinations: Auditory  Suicidal Thoughts:  No  Homicidal Thoughts:  No  Memory:  Immediate;   Fair Recent;   Fair Remote;   Fair  Judgement:  Impaired  Insight:  Lacking  Psychomotor Activity:  Increased  Concentration:  Fair  Recall:  [employer] of Knowledge:Fair  Language: Good  Akathisia:  No  Handed:  Right  AIMS (if indicated):     Assets:  Communication Skills Physical Health  Sleep:   poor   Musculoskeletal: Strength & Muscle Tone: within normal limits Gait &  Station: normal Patient leans: N/A  Treatment Plan Summary: Daily contact with patient to assess and evaluate symptoms and progress in treatment Medication management Will benefit from inpatient placement for stabilization  Corena Pilgrim, MD 09/01/2014 8:42 AM

## 2014-09-01 NOTE — ED Notes (Signed)
Patient is walking the halls with his eyes closed talking to us. We keep asking him to open his eyes or try to lay down but patient is refusing.

## 2014-09-01 NOTE — ED Notes (Signed)
As Clinical research associatewriter was changing the television channel for room 39. Mr. Philip [name] came down to room 39 and started talking. I had asked the patient if he could go back to his room and I will be there when I was done with the patient I was with. He had refused and kept talking I explained that I was with another patient and I could not help him at that time but would be willing to help him as soon as I was done he folded his arms and left

## 2014-09-01 NOTE — ED Notes (Signed)
Patient is at the side door of the nursing station with his body pressed up against the glass. Patient was stopped from following the nurse down the hall while she went in room 41 to assess her patient that had just arrived on the unit

## 2014-09-01 NOTE — ED Notes (Signed)
Patient is pulling the garbage bags out of the rooms, and trying to go in and out of the rooms

## 2014-09-02 ENCOUNTER — Encounter (HOSPITAL_COMMUNITY): Payer: Self-pay | Admitting: Psychiatry

## 2014-09-02 DIAGNOSIS — F909 Attention-deficit hyperactivity disorder, unspecified type: Secondary | ICD-10-CM

## 2014-09-02 DIAGNOSIS — F1290 Cannabis use, unspecified, uncomplicated: Secondary | ICD-10-CM

## 2014-09-02 DIAGNOSIS — F250 Schizoaffective disorder, bipolar type: Principal | ICD-10-CM

## 2014-09-02 MED ORDER — LORAZEPAM 2 MG/ML IJ SOLN
1.0000 mg | Freq: Three times a day (TID) | INTRAMUSCULAR | Status: DC | PRN
  Administered 2014-09-06: 1 mg via INTRAMUSCULAR
  Filled 2014-09-02 (×2): qty 1

## 2014-09-02 MED ORDER — HALOPERIDOL 5 MG PO TABS
10.0000 mg | ORAL_TABLET | Freq: Three times a day (TID) | ORAL | Status: DC | PRN
  Administered 2014-09-02 – 2014-09-08 (×9): 10 mg via ORAL
  Filled 2014-09-02 (×9): qty 2

## 2014-09-02 MED ORDER — ZIPRASIDONE MESYLATE 20 MG IM SOLR
20.0000 mg | Freq: Once | INTRAMUSCULAR | Status: AC
  Administered 2014-09-02: 20 mg via INTRAMUSCULAR
  Filled 2014-09-02: qty 20

## 2014-09-02 MED ORDER — HALOPERIDOL LACTATE 5 MG/ML IJ SOLN: 10.0000 mg | Freq: Three times a day (TID) | INTRAMUSCULAR | Status: DC | PRN

## 2014-09-02 MED ORDER — DIPHENHYDRAMINE HCL 50 MG/ML IJ SOLN: 50.0000 mg | Freq: Three times a day (TID) | INTRAMUSCULAR | Status: DC | PRN

## 2014-09-02 MED ORDER — DIPHENHYDRAMINE HCL 25 MG PO CAPS
50.0000 mg | ORAL_CAPSULE | Freq: Three times a day (TID) | ORAL | Status: DC | PRN
  Administered 2014-09-05 – 2014-09-07 (×6): 50 mg via ORAL
  Filled 2014-09-02 (×6): qty 2

## 2014-09-02 MED ORDER — LORAZEPAM 2 MG/ML IJ SOLN: 1.0000 mg | Freq: Three times a day (TID) | INTRAMUSCULAR | Status: DC | PRN

## 2014-09-02 MED ORDER — HYDROXYZINE HCL 25 MG PO TABS
25.0000 mg | ORAL_TABLET | Freq: Four times a day (QID) | ORAL | Status: DC | PRN
  Administered 2014-09-03 – 2014-09-10 (×9): 25 mg via ORAL
  Filled 2014-09-02: qty 1
  Filled 2014-09-02: qty 56
  Filled 2014-09-02 (×10): qty 1

## 2014-09-02 MED ORDER — ACETAMINOPHEN 325 MG PO TABS
650.0000 mg | ORAL_TABLET | Freq: Four times a day (QID) | ORAL | Status: DC | PRN
Start: 2014-09-02 — End: 2014-09-10
  Administered 2014-09-03 – 2014-09-05 (×2): 650 mg via ORAL
  Filled 2014-09-02 (×2): qty 2

## 2014-09-02 MED ORDER — ZIPRASIDONE MESYLATE 20 MG IM SOLR
INTRAMUSCULAR | Status: AC
Start: 2014-09-02 — End: 2014-09-02
  Filled 2014-09-02: qty 20

## 2014-09-02 MED ORDER — LORAZEPAM 1 MG PO TABS
1.0000 mg | ORAL_TABLET | Freq: Three times a day (TID) | ORAL | Status: DC | PRN
  Administered 2014-09-02 – 2014-09-07 (×11): 1 mg via ORAL
  Filled 2014-09-02 (×11): qty 1

## 2014-09-02 MED ORDER — HALOPERIDOL LACTATE 5 MG/ML IJ SOLN
10.0000 mg | Freq: Three times a day (TID) | INTRAMUSCULAR | Status: DC | PRN
  Administered 2014-09-06: 10 mg via INTRAMUSCULAR
  Filled 2014-09-02 (×2): qty 2
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
## 2014-09-02 NOTE — [facility] Group Notes (Signed)
[facility] LCSW Group Therapy  09/02/2014 1:25 PM  Type of Therapy:  Group Therapy  Participation Level:  Active  Participation Quality:  Intrusive  Affect:  Flat  Cognitive:  Disorganized  Insight:  Distracting and Off Topic  Engagement in Therapy:  Limited  Modes of Intervention:  Discussion, Education, Socialization and Support  Summary of Progress/Problems:Feelings around Relapse. Group members discussed the meaning of relapse and shared personal stories of relapse, how it affected them and others, and how they perceived themselves during this time. Group members were encouraged to identify triggers, warning signs and coping skills used when facing the possibility of relapse. Social supports were discussed and explored in detail. Philip DistanceRodney attended group but walked in and out of the room a few times. He interrupted other group members and exhibited poor insight.  He was slurring his words due to sedation, so was a bit hard to follow.  He was taking notes from the board throughout group.  He shared that he can be trusted and counted on by his friends.   [name],Philip 09/02/2014, 1:25 PM

## 2014-09-02 NOTE — [facility] Suicide Risk Assessment (Signed)
   Nursing information obtained from:  Patient Demographic factors:  Male, Adolescent or young adult, Unemployed Current Mental Status:  Thoughts of violence towards others Loss Factors:  Decrease in vocational status, Legal issues Historical Factors:  Impulsivity Risk Reduction Factors:  Sense of responsibility to family, Living with another person, especially a relative, Positive social support Total Time spent with patient: 30 minutes  CLINICAL FACTORS:   Bipolar Disorder:   Mixed State Alcohol/Substance Abuse/Dependencies Currently Psychotic Unstable or Poor Therapeutic Relationship Previous Psychiatric Diagnoses and Treatments  Psychiatric Specialty Exam: Physical Exam Please see H&P.   ROS  Blood pressure 127/81, pulse 119, temperature 98.3 F (36.8 C), temperature source Oral, resp. rate 20, height 5\' 10"  (1.778 m), weight 84.823 kg (187 lb), SpO2 100 %.Body mass index is 26.83 kg/(m^2).  Please see H&P for MSE.  SUICIDE RISK:   Mild:  Suicidal ideation of limited frequency, intensity, duration, and specificity.  There are no identifiable plans, no associated intent, mild dysphoria and related symptoms, good self-control (both objective and subjective assessment), few other risk factors, and identifiable protective factors, including available and accessible social support.  PLAN OF CARE:Please see H&P.   I certify that inpatient services furnished can reasonably be expected to improve the patient's condition.  Halvor Behrend md 09/02/2014, 3:00 PM
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
## 2014-09-02 NOTE — Progress Notes (Signed)
Adult Psychoeducational Group Note  Date:  09/02/2014 Time:  8:53 PM  Group Topic/Focus:  Wrap-Up Group:   The focus of this group is to help patients review their daily goal of treatment and discuss progress on daily workbooks.  Participation Level:  Minimal  Participation Quality:  Drowsy  Affect:  Appropriate  Cognitive:  Confused  Insight: Limited  Engagement in Group:  Off Topic  Modes of Intervention:  Discussion  Additional Comments: Patient seemed confused.  Philip [name], Philip [name] 09/02/2014, 8:53 PM

## 2014-09-02 NOTE — Progress Notes (Signed)
Patient ID: Philip [name], male   DOB: 05/29/1991, 23 y.o.   MRN: [id number]   D:  Writer woke pt in order to give prescribed meds and assess. Pt stated he woke up "because his son is in [place]. Then pt asked, "where is my mom".  Pt came to the medication window and stated, "I was wondering where Tip's dog at". Stated, "is this uniform or reformatory school?" Pt was oriented to place but was aware of all other.   A:  Support and encouragement was offered. 15 min checks continued for safety.  R: Pt remains safe.

## 2014-09-02 NOTE — Plan of Care (Signed)
Problem: Alteration in thought process Goal: LTG-Patient verbalizes understanding importance med regimen (Patient verbalizes understanding of importance of medication regimen and need to continue outpatient care.)  Outcome: Progressing Patient reports that he understands that he needs his medications but does still question his medications before taking them. Patient is compliant at this time.

## 2014-09-02 NOTE — Progress Notes (Signed)
Patient ID: Philip [name], male   DOB: 10-26-1991, 23 y.o.   MRN: [id number]  D: Pt. Denies SI/HI and auditory Hallucinations. Patient reports visual hallucinations but does not elaborate on what he is experiencing. Patient reports pain 10/10 but is seen in the milieu laughing with no obvious distress. Patient reports that he wants, "some percocets." Patient was informed that the MD has that decision but other pain interventions have been offered but patient refused. Patient was given PRN Ativan this morning and it was helpful upon readmission.  A: Support and encouragement provided to the patient to be careful when walking due to patient's unsteadiness on his feet. Patient verbalized understanding and said, "I'm fine I'm just bullshittin'." Writer and other staff are reinforcing rules on the unit to maintain control. Scheduled medications administered to patient per physician's orders.  R: Patient is receptive and cooperative. Patient is seen in the milieu walking around frequently. Patient is restless and is unable to sit still for long. Q15 minute checks are maintained for safety.

## 2014-09-02 NOTE — [facility] Group Notes (Signed)
[facility]   09/02/2014 10:24 AM  Participation Quality:    Mood/Affect:  Lethargic  Depression Rating:    Anxiety Rating:    Thoughts of Suicide:  No Will you contract for safety?   NA  Current AVH:  Denies  Plan for Discharge/Comments:  Thereasa DistanceRodney slept for most of group.  Woke up and spilled his water on the floor, but attempted to clean it up.  Stated that he is ready for d/c, but also admitted that he has been "snapping a lot" at other people, which has led to problems.  Stated he needs percocet for his pain.  Transportation Means:   Supports:  Daryel GeraldNorth, Brendt B

## 2014-09-02 NOTE — Tx Team (Signed)
Interdisciplinary Treatment Plan Update (Adult)  Date: 09/02/2014 Time Reviewed: 8:47 AM  Progress in Treatment:  Attending groups: Yes Participating in groups: Yes  Taking medication as prescribed: Yes  Tolerating medication: Yes  Family/Significant othe contact made: No Patient understands diagnosis: Yes  AEB asking for help with "snapping" at others Discussing patient identified problems/goals with staff: Yes  Medical problems stabilized or resolved: Yes  Denies suicidal/homicidal ideation: . Yes  In tx team Patient has not harmed self or Others: Yes  New problem(s) identified:  Discharge Plan or Barriers: Return home, follow up outpt Additional comments: Pt and CSW Intern reviewed pt's identified goals and treatment plan. Pt verbalized understanding and agreed to treatment plan  Reason for Continuation of Hospitalization:  Estimated length of stay: 4-5 days   Attendees:  Patient:  09/02/2014 8:47 AM   Family:  11/4/20158:47 AM   Physician: Dr. Elna BreslowEappen MD  11/4/20158:47 AM  Nursing:  Marzetta Boardhrista Dopson, RN 09/02/2014 8:47 AM  Clinical Social Worker: Daryel Geraldodney Terriah Reggio, [place]  11/4/20158:47 AM  Clinical Social Worker: Charleston Ropesandace Hyatt, CSW Intern 11/4/20158:47 AM  Other:  11/4/20158:47 AM  Other:  11/4/20158:47 AM  Other:  11/4/20158:47 AM  Scribe for Treatment Team:  Charleston Ropesandace Hyatt, CSW Intern 09/02/2014 8:47 AM
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
## 2014-09-03 DIAGNOSIS — F259 Schizoaffective disorder, unspecified: Secondary | ICD-10-CM

## 2014-09-03 LAB — LIPID PANEL
Cholesterol: 112 mg/dL (ref 0–200)
HDL: 32 mg/dL — ABNORMAL LOW (ref >39)
LDL Cholesterol: 67 mg/dL (ref 0–99)
TRIGLYCERIDES: 65 mg/dL (ref <150)
Total CHOL/HDL Ratio: 3.5 RATIO
VLDL: 13 mg/dL (ref 0–40)

## 2014-09-03 LAB — TSH: TSH: 1 u[IU]/mL (ref 0.350–4.500)

## 2014-09-03 LAB — HEMOGLOBIN A1C
HEMOGLOBIN A1C: 5.5 % (ref <5.7)
Mean Plasma Glucose: 111 mg/dL (ref <117)

## 2014-09-03 MED ORDER — OLANZAPINE 10 MG PO TBDP
10.0000 mg | ORAL_TABLET | Freq: Every day | ORAL | Status: DC
  Administered 2014-09-03: 10 mg via ORAL
  Filled 2014-09-03 (×4): qty 1

## 2014-09-03 NOTE — Progress Notes (Signed)
Patient ID: Philip [name], male   DOB: April 08, 1991, 23 y.o.   MRN: [id number]  D: Pt. Denies SI/HI and A/V Hallucinations to this Clinical research associatewriter. Patient reports pain and received PRN Tylenol for this and reported relief upon reassessment. Patient is agitated, pacing up and down the halls. Patient's eyes are almost closed when walking. Patient is redirected frequently and taught how to remain safe from falls. Patient can be argumentative at times. A: Support and encouragement provided to the patient to lay down to take a nap considering patient has not slept well and is walking around the milieu unsteadily. Scheduled medications administered to patient per physician's orders. R: Patient is intrusive and tangential. Patient remains preoccupied with discharge and states that he does not need to be here although it is clear that patient is not ready to discharge yet. Q15 minute checks are maintained for safety.

## 2014-09-03 NOTE — [facility] Group Notes (Signed)
[facility] LCSW Group Therapy  09/03/2014 2:49 PM   Type of Therapy:  Group Therapy  Participation Level:  Active  Participation Quality:  Attentive  Affect:  Appropriate  Cognitive:  Appropriate  Insight:  Improving  Engagement in Therapy:  Engaged  Modes of Intervention:  Clarification, Education, Exploration and Socialization  Summary of Progress/Problems: Today's group focused on relapse prevention.  We defined the term, and then brainstormed on ways to prevent relapse.  Wandered in and out of group several times.  Was pulling the liner bag out of the trash at one point.  Appeared to be searching for something.  Disorganized, confused, speech is difficult to understand.  No meaningful conversation.  Daryel Geraldorth, Buckley B 09/03/2014 , 2:49 PM

## 2014-09-03 NOTE — Progress Notes (Signed)
Did not attended group 

## 2014-09-03 NOTE — Plan of Care (Signed)
Problem: Ineffective individual coping Goal: STG: Patient will remain free from self harm Outcome: Completed/Met Date Met:  09/03/14 Patient has remained free from harm while on this unit.

## 2014-09-03 NOTE — Progress Notes (Signed)
[facility] MD Progress Note  09/03/2014 7:02 PM Raymondo BandRodney D Connole  MRN:  [id number] Subjective:  Thereasa DistanceRodney has been sedated but unable to sleep. Upon assessment he was slurring his words. He stated he had been trying to behave and stated he had done well. He hopes to go back with his mother.  Diagnosis:   DSM5: Bipolar Disorder manic with psychosis total Time spent with patient: 30 minutes  Axis I: Schizoaffective Disorder  ADL's:  Intact  Sleep: Poor  Appetite:  Fair   Psychiatric Specialty Exam: Physical Exam  Review of Systems  Constitutional: Positive for malaise/fatigue.  HENT: Negative.   Eyes: Negative.   Respiratory: Negative.   Cardiovascular: Negative.   Gastrointestinal: Negative.   Genitourinary: Negative.   Musculoskeletal: Negative.   Skin: Negative.   Neurological: Positive for weakness.  Endo/Heme/Allergies: Negative.   Psychiatric/Behavioral: The patient is nervous/anxious and has insomnia.     Blood pressure 119/73, pulse 122, temperature 98.4 F (36.9 C), temperature source Oral, resp. rate 20, height 5\' 10"  (1.778 m), weight 84.823 kg (187 lb), SpO2 100 %.Body mass index is 26.83 kg/(m^2).  General Appearance: Disheveled  Eye Contact::  Minimal  Speech:  Slurred  Volume:  Decreased  Mood:  Anxious, Depressed and worried  Affect:  restricted  Thought Process:  Linear  Orientation:  Other:  place person  Thought Content:  no spontaneous content  Suicidal Thoughts:  No  Homicidal Thoughts:  No  Memory:  Immediate;   unable to evaluate Recent;   not evaluate Remote;   not evaluate  Judgement:  Impaired  Insight:  Lacking  Psychomotor Activity:  Restlessness  Concentration:  Poor  Recall:  Poor  Fund of Knowledge:NA  Language: Fair  Akathisia:  No  Handed:    AIMS (if indicated):     Assets:  Desire for Improvement Housing Social Support  Sleep:  Number of Hours: 3   Musculoskeletal: Strength & Muscle Tone: within normal limits Gait & Station:  normal Patient leans: N/A  Current Medications: Current Facility-Administered Medications  Medication Dose Route Frequency Provider Last Rate Last Dose  . acetaminophen (TYLENOL) tablet 650 mg  650 mg Oral Q6H PRN Jomarie LongsSaramma Eappen, MD   650 mg at 09/03/14 0734  . benztropine (COGENTIN) tablet 1 mg  1 mg Oral BID Shuvon Rankin, NP   1 mg at 09/03/14 1624  . haloperidol (HALDOL) tablet 10 mg  10 mg Oral Q8H PRN Jomarie LongsSaramma Eappen, MD   10 mg at 09/02/14 2324   And  . LORazepam (ATIVAN) tablet 1 mg  1 mg Oral Q8H PRN Jomarie LongsSaramma Eappen, MD   1 mg at 09/03/14 0837   And  . diphenhydrAMINE (BENADRYL) capsule 50 mg  50 mg Oral Q8H PRN Saramma Eappen, MD      . diphenhydrAMINE (BENADRYL) injection 50 mg  50 mg Intramuscular Q8H PRN Saramma Eappen, MD       And  . haloperidol lactate (HALDOL) injection 10 mg  10 mg Intramuscular Q8H PRN Jomarie LongsSaramma Eappen, MD       And  . LORazepam (ATIVAN) injection 1 mg  1 mg Intramuscular Q8H PRN Jomarie LongsSaramma Eappen, MD      . divalproex (DEPAKOTE ER) 24 hr tablet 1,500 mg  1,500 mg Oral QHS Shuvon Rankin, NP   1,500 mg at 09/02/14 2052  . hydrOXYzine (ATARAX/VISTARIL) tablet 25 mg  25 mg Oral Q6H PRN Jomarie LongsSaramma Eappen, MD   25 mg at 09/03/14 0734  . magnesium hydroxide (MILK OF MAGNESIA) suspension  30 mL  30 mL Oral Daily PRN Shuvon Rankin, NP   30 mL at 09/02/14 0817  . OLANZapine zydis (ZYPREXA) disintegrating tablet 10 mg  10 mg Oral QHS Rachael FeeIrving A Sherrel Ploch, MD      . traZODone (DESYREL) tablet 100 mg  100 mg Oral QHS PRN Shuvon Rankin, NP   100 mg at 09/02/14 2222    Lab Results:  Results for orders placed or performed during the hospital encounter of 09/01/14 (from the past 48 hour(s))  Hemoglobin A1c     Status: None   Collection Time: 09/03/14  6:24 AM  Result Value Ref Range   Hgb A1c MFr Bld 5.5 <5.7 %    Comment: (NOTE)                                                                       According to the ADA Clinical Practice Recommendations for 2011, when HbA1c is used  as a screening test:  >=6.5%   Diagnostic of Diabetes Mellitus           (if abnormal result is confirmed) 5.7-6.4%   Increased risk of developing Diabetes Mellitus References:Diagnosis and Classification of Diabetes Mellitus,Diabetes Care,2011,34(Suppl 1):S62-S69 and Standards of Medical Care in         Diabetes - 2011,Diabetes Care,2011,34 (Suppl 1):S11-S61.    Mean Plasma Glucose 111 <117 mg/dL    Comment: Performed at [facility]  Lipid panel     Status: Abnormal   Collection Time: 09/03/14  6:24 AM  Result Value Ref Range   Cholesterol 112 0 - 200 mg/dL   Triglycerides 65 <409<150 mg/dL   HDL 32 (L) >81>39 mg/dL   Total CHOL/HDL Ratio 3.5 RATIO   VLDL 13 0 - 40 mg/dL   LDL Cholesterol 67 0 - 99 mg/dL    Comment:        Total Cholesterol/HDL:CHD Risk Coronary Heart Disease Risk Table                     Men   Women  1/2 Average Risk   3.4   3.3  Average Risk       5.0   4.4  2 X Average Risk   9.6   7.1  3 X Average Risk  23.4   11.0        Use the calculated Patient Ratio above and the CHD Risk Table to determine the patient's CHD Risk.        ATP III CLASSIFICATION (LDL):  <100     mg/dL   Optimal  [id number]  mg/dL   Near or Above                    Optimal  130-159  mg/dL   Borderline  [id number]  mg/dL   High  >308>190     mg/dL   Very High [facility]   TSH     Status: None   Collection Time: 09/03/14  6:26 AM  Result Value Ref Range   TSH 1.000 0.350 - 4.500 uIU/mL    Comment: Performed at [facility]    Physical Findings: AIMS: Facial and Oral Movements Muscles of Facial Expression: None,  normal Lips and Perioral Area: None, normal Jaw: None, normal Tongue: None, normal,Extremity Movements Upper (arms, wrists, hands, fingers): None, normal Lower (legs, knees, ankles, toes): None, normal, Trunk Movements Neck, shoulders, hips: None, normal, Overall Severity Severity of abnormal movements (highest score from questions above):  None, normal Incapacitation due to abnormal movements: None, normal Patient's awareness of abnormal movements (rate only patient's report): No Awareness, Dental Status Current problems with teeth and/or dentures?: No Does patient usually wear dentures?: No  CIWA:    COWS:     Treatment Plan Summary: Daily contact with patient to assess and evaluate symptoms and progress in treatment Medication management  Plan: Supportive approach/coping skills           Has not done well on Risperdal as per the chart (previous hospitalizations)           Will try Zyprexa Zydis 10 mg HS and reassess  Medical Decision Making Problem Points:  Established problem, worsening (2) Data Points:  Review of medication regiment & side effects (2) Review of new medications or change in dosage (2)  I certify that inpatient services furnished can reasonably be expected to improve the patient's condition.   Roiza Wiedel A 09/03/2014, 7:02 PM
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
## 2014-09-03 NOTE — [facility] Group Notes (Signed)
[facility] Group Notes:  (Nursing/MHT/Case Management/Adjunct)  Date:  09/03/2014  Time:  9:45 AM  Type of Therapy:  Nurse Education  Participation Level:  Minimal  Participation Quality:  Intrusive, Inattentive and Redirectable  Affect:  Appropriate  Cognitive:  Appropriate  Insight:  Lacking  Engagement in Group:  Distracting  Modes of Intervention:  Discussion and Education  Summary of Progress/Problems: The focus of this group is leisure and lifestyle changes. Patient was constantly walking in and out of the group room. Patient had his hand in the air and when writer called on him he said he is ready to go. Patient did not add to the group and did not appear to be listening. Patient left group early.  Montzerrat Brunell E 09/03/2014, 9:45 AM

## 2014-09-04 DIAGNOSIS — F250 Schizoaffective disorder, bipolar type: Secondary | ICD-10-CM | POA: Insufficient documentation

## 2014-09-04 MED ORDER — OLANZAPINE 5 MG PO TBDP
15.0000 mg | ORAL_TABLET | Freq: Every day | ORAL | Status: DC
  Administered 2014-09-04 – 2014-09-05 (×2): 15 mg via ORAL
  Filled 2014-09-04 (×5): qty 1

## 2014-09-04 NOTE — Progress Notes (Signed)
D. Pt has been up and has been visible in milieu this evening, did not attend or participate in evening group session. Pt has been pacing halls and was seen trying to go in another room and needed re-direction to remind which room was his. Pt has been preoccupied with discharge and spoke about how he wanted to leave and needed re-direction to remind him that he was unable to leave this evening. Pt has been up and down so far this evening in regards to resting. A. Support and encouragement provided. R. Safety maintained, will continue to monitor.

## 2014-09-04 NOTE — Progress Notes (Signed)
[facility] Group Notes:  (Nursing/MHT/Case Management/Adjunct)  Date:  09/04/2014  Time:  10:24 PM  Type of Therapy:  Psychoeducational Skills  Participation Level:  None  Participation Quality:  Resistant  Affect:  Resistant  Cognitive:  Lacking  Insight:  None  Engagement in Group:  None  Modes of Intervention:  Education  Summary of Progress/Problems: The patient walked in and out of group and did not participate in the group session.   Hazle CocaGOODMAN, Paden Senger S 09/04/2014, 10:24 PM

## 2014-09-04 NOTE — [facility] Group Notes (Signed)
[facility] LCSW Group Therapy  09/04/2014  1:05 PM  Type of Therapy:  Group therapy  Participation Level:  Active  Participation Quality:  Attentive  Affect:  Flat  Cognitive:  Oriented  Insight:  Limited  Engagement in Therapy:  Limited  Modes of Intervention:  Discussion, Socialization  Summary of Progress/Problems:  Chaplain was here to lead a group on themes of hope and courage. "For every action there is a reaction."  This was the most decipherable phrase uttered by Thereasa Distanceodney during group.  He spoke regularly, but generally when others were talking, and speech was garbled.  Others laughed at what he had to say, or at him, several times, but he did not notice, or just ignored them.  Daryel Geraldorth, Jadrien B 09/04/2014 1:39 PM

## 2014-09-04 NOTE — Progress Notes (Signed)
Patient ID: Philip [name], male   DOB: [birth year], 23 y.o.   MRN: [id number] D. Patient presents with irritable mood, affect labile. Thereasa DistanceRodney presents with tangential speech, and he remains very restless on the unit. He states ''I'm about to bust ouf of this joint! When is the doctor here? I want to leave. I've got things to be doing. My apple here is bad, it doesn't taste right. You see that fella over there is messed up. I need some more medicines! '' He continues to require frequent redirection for intrusive behaviors. A. Medications given as ordered. Support and encouragement provided. Discussed above information with Dr. Lala Lundobos/treatment team. R. Patient is safe, in no acute distress at this time. Will continue to monitor q 15 minutes for safety.

## 2014-09-04 NOTE — Progress Notes (Signed)
Patient ID: Raymondo BandRodney D Leitzel, male   DOB: 06/06/1991, 23 y.o.   MRN: [id number] D)   Was walking on the hall this evening, stated wanted to leave, needed to smoke.  Was somewhat difficult to understand initially, but seemed irritable when asked to repeat what he said.  Has been intrusive and has boundary issues, walking into the charting area and was encouraged to back up so that I could get out.  Has not been threatening, but has followed me at times to let me know he would like to leave so he could smoke.  Was offered a patch as well as gum, which he has refused.  Wanted his meds early, but agreed to wait until after group.  Had snack and came back for hs meds.  Encouraged him to try to get some sleep.  Interacted with peers for a time and has gone to his room. Denied thoughts of self harm. A)  Will continue to monitor for safety, continue POC R)  Safety maintained.

## 2014-09-04 NOTE — Progress Notes (Signed)
[facility]  09/04/2014 5:37 PM Philip [name]  MRN:  [id number] Subjective:  Philip [name] seems to be responding to internal stimuli. At times incoherent illogical. He would respond to redirection. He states he wants to be D/C. Gets upset when told he was not ready  to be D/C today.  Diagnosis:   DSM5: Bipolar Affective manic with psychosis Time spent with patient: 30 minutes  Axis I: Schizoaffective Disorder  ADL's:  Intact  Sleep: Poor  Appetite:  Fair  Psychiatric Specialty Exam: Physical Exam  Review of Systems  Constitutional: Negative.   HENT: Negative.   Eyes: Negative.   Respiratory: Negative.   Cardiovascular: Negative.   Gastrointestinal: Negative.   Genitourinary: Negative.   Musculoskeletal: Negative.   Skin: Negative.   Neurological: Negative.   Endo/Heme/Allergies: Negative.   Psychiatric/Behavioral: Positive for hallucinations. The patient is nervous/anxious.     Blood pressure 150/82, pulse 114, temperature 97.6 F (36.4 C), temperature source Oral, resp. rate 18, height 5\' 10"  (1.778 m), weight 84.823 kg (187 lb), SpO2 100 %.Body mass index is 26.83 kg/(m^2).  General Appearance: Fairly Groomed  Patent attorneyye Contact::  Fair  Speech:  Blocked, Pressured and Slurred  Volume:  fluctuates  Mood:  Anxious and Dysphoric  Affect:  Labile and Restricted  Thought Process:  Disorganized  Orientation:  Other:  person place  Thought Content:  wants to go home, delusional material gang related  Suicidal Thoughts:  No  Homicidal Thoughts:  No  Memory:  Immediate;   Fair Recent;   Fair Remote;   Fair  Judgement:  Poor  Insight:  Lacking  Psychomotor Activity:  Restlessness  Concentration:  Poor  Recall:  Poor  Fund of Knowledge:NA  Language: Fair  Akathisia:  No  Handed:    AIMS (if indicated):     Assets:  Housing Social Support  Sleep:  Number of Hours: 6.75   Musculoskeletal: Strength & Muscle Tone: within normal limits Gait & Station: normal Patient  leans: N/A  Current Medications: Current Facility-Administered Medications  Medication Dose Route Frequency Provider Last Rate Last Dose  . acetaminophen (TYLENOL) tablet 650 mg  650 mg Oral Q6H PRN Jomarie LongsSaramma Eappen, MD   650 mg at 09/03/14 0734  . benztropine (COGENTIN) tablet 1 mg  1 mg Oral BID Shuvon Rankin, NP   1 mg at 09/04/14 1559  . haloperidol (HALDOL) tablet 10 mg  10 mg Oral Q8H PRN Jomarie LongsSaramma Eappen, MD   10 mg at 09/04/14 1600   And  . LORazepam (ATIVAN) tablet 1 mg  1 mg Oral Q8H PRN Jomarie LongsSaramma Eappen, MD   1 mg at 09/04/14 1600   And  . diphenhydrAMINE (BENADRYL) capsule 50 mg  50 mg Oral Q8H PRN Saramma Eappen, MD      . diphenhydrAMINE (BENADRYL) injection 50 mg  50 mg Intramuscular Q8H PRN Saramma Eappen, MD       And  . haloperidol lactate (HALDOL) injection 10 mg  10 mg Intramuscular Q8H PRN Jomarie LongsSaramma Eappen, MD       And  . LORazepam (ATIVAN) injection 1 mg  1 mg Intramuscular Q8H PRN Jomarie LongsSaramma Eappen, MD      . divalproex (DEPAKOTE ER) 24 hr tablet 1,500 mg  1,500 mg Oral QHS Shuvon Rankin, NP   1,500 mg at 09/03/14 2103  . hydrOXYzine (ATARAX/VISTARIL) tablet 25 mg  25 mg Oral Q6H PRN Jomarie LongsSaramma Eappen, MD   25 mg at 09/04/14 0919  . magnesium hydroxide (MILK OF MAGNESIA) suspension 30  mL  30 mL Oral Daily PRN Shuvon Rankin, NP   30 mL at 09/04/14 0820  . OLANZapine zydis (ZYPREXA) disintegrating tablet 15 mg  15 mg Oral QHS Rachael Fee, MD      . traZODone (DESYREL) tablet 100 mg  100 mg Oral QHS PRN Shuvon Rankin, NP   100 mg at 09/02/14 2222    Lab Results:  Results for orders placed or performed during the hospital encounter of 09/01/14 (from the past 48 hour(s))  Hemoglobin A1c     Status: None   Collection Time: 09/03/14  6:24 AM  Result Value Ref Range   Hgb A1c MFr Bld 5.5 <5.7 %    Comment: (NOTE)                                                                       According to the ADA Clinical Practice Recommendations for 2011, when HbA1c is used as a screening  test:  >=6.5%   Diagnostic of Diabetes Mellitus           (if abnormal result is confirmed) 5.7-6.4%   Increased risk of developing Diabetes Mellitus References:Diagnosis and Classification of Diabetes Mellitus,Diabetes Care,2011,34(Suppl 1):S62-S69 and Standards of Medical Care in         Diabetes - 2011,Diabetes Care,2011,34 (Suppl 1):S11-S61.    Mean Plasma Glucose 111 <117 mg/dL    Comment: Performed at [facility]  Lipid panel     Status: Abnormal   Collection Time: 09/03/14  6:24 AM  Result Value Ref Range   Cholesterol 112 0 - 200 mg/dL   Triglycerides 65 <161 mg/dL   HDL 32 (L) >09 mg/dL   Total CHOL/HDL Ratio 3.5 RATIO   VLDL 13 0 - 40 mg/dL   LDL Cholesterol 67 0 - 99 mg/dL    Comment:        Total Cholesterol/HDL:CHD Risk Coronary Heart Disease Risk Table                     Men   Women  1/2 Average Risk   3.4   3.3  Average Risk       5.0   4.4  2 X Average Risk   9.6   7.1  3 X Average Risk  23.4   11.0        Use the calculated Patient Ratio above and the CHD Risk Table to determine the patient's CHD Risk.        ATP III CLASSIFICATION (LDL):  <100     mg/dL   Optimal  604-540  mg/dL   Near or Above                    Optimal  130-159  mg/dL   Borderline  981-191  mg/dL   High  >478     mg/dL   Very High Performed at [facility]   TSH     Status: None   Collection Time: 09/03/14  6:26 AM  Result Value Ref Range   TSH 1.000 0.350 - 4.500 uIU/mL    Comment: Performed at [facility]    Physical Findings: AIMS: Facial and Oral Movements Muscles of Facial Expression: None, normal  Lips and Perioral Area: None, normal Jaw: None, normal Tongue: None, normal,Extremity Movements Upper (arms, wrists, hands, fingers): None, normal Lower (legs, knees, ankles, toes): None, normal, Trunk Movements Neck, shoulders, hips: None, normal, Overall Severity Severity of abnormal movements (highest score from questions above): None,  normal Incapacitation due to abnormal movements: None, normal Patient's awareness of abnormal movements (rate only patient's report): No Awareness, Dental Status Current problems with teeth and/or dentures?: No Does patient usually wear dentures?: No  CIWA:    COWS:     Treatment Plan Summary: Daily contact with patient to assess and evaluate symptoms and progress in treatment Medication management  Plan: Supportive approach/coping skills           Improve reality testing           Increase the Zyprexa Zydis to 15 mg HS  Medical Decision Making Problem Points:  Review of psycho-social stressors (1) Data Points:  Review of medication regiment & side effects (2)  I certify that inpatient services furnished can reasonably be expected to improve the patient's condition.   Demarrio Menges A 09/04/2014, 5:37 PM

## 2014-09-04 NOTE — [facility] Group Notes (Signed)
[facility]   09/04/2014 10:03 AM  Participation Quality:  Active   Mood/Affect:  Flat  Depression Rating:  0  Anxiety Rating:  10   Thoughts of Suicide:  No Will you contract for safety?   NA  Current AVH:  Yes  Plan for Discharge/Comments:  Pt plans to return home and receive outpatient services. Thereasa DistanceRodney endorses AVH this morning. During group he appeared delusional and disorganized.   Transportation Means: Bus   Supports: Mother   Hyatt,Candace

## 2014-09-05 LAB — COMPREHENSIVE METABOLIC PANEL
ALBUMIN: 3.7 g/dL (ref 3.5–5.2)
ALT: 41 U/L (ref 0–53)
ANION GAP: 12 (ref 5–15)
AST: 24 U/L (ref 0–37)
Alkaline Phosphatase: 88 U/L (ref 39–117)
BUN: 9 mg/dL (ref 6–23)
CHLORIDE: 101 meq/L (ref 96–112)
CO2: 25 mEq/L (ref 19–32)
CREATININE: 1.02 mg/dL (ref 0.50–1.35)
Calcium: 10 mg/dL (ref 8.4–10.5)
GFR calc Af Amer: >90 mL/min (ref >90)
Glucose, Bld: 111 mg/dL — ABNORMAL HIGH (ref 70–99)
Potassium: 4.3 mEq/L (ref 3.7–5.3)
Sodium: 138 mEq/L (ref 137–147)
Total Bilirubin: <0.2 mg/dL — ABNORMAL LOW (ref 0.3–1.2)
Total Protein: 7.4 g/dL (ref 6.0–8.3)

## 2014-09-05 LAB — VALPROIC ACID LEVEL: Valproic Acid Lvl: 102.9 ug/mL — ABNORMAL HIGH (ref 50.0–100.0)

## 2014-09-05 LAB — AMMONIA: AMMONIA: 75 umol/L — AB (ref 11–60)

## 2014-09-05 NOTE — Progress Notes (Signed)
[facility] MD Progress Note  09/05/2014 5:56 PM Philip [name]  MRN:  [id number] Subjective: Pt seen and chart reviewed. Pt is well-known to this NP x 9 years and since pt's age of 19 via correctional settings (jail, prison). Pt and this NP have good rapport and pt was willing to speak openly. Pt states "I'm glad they sent somebody I knew. I'm not trying to talk to these crazy people in here. They think I'm crazy. You got me, man.. You know I'm not crazy, you been knowing me for a long time. What's wrong with these people?". This NP reassured pt that we have to do a psych evaluation on him and that we are trying to help. Pt affirmed understanding and agreed to cooperate with staff, although somewhat ruminating and fixating on smoking a cigarette. Pt reports that he is hearing voices, "but just Tyga mostly". Tyga is a well-known rapper and the pt reports that he hears this rapper "speak to me in my head, but he tells me to get my shit together and finish college, which I'm actually doing right now. I'm getting my business degree. I'm not crazy, I just had some problems with someone talking about my girl and I went off about it". Pt cites main stressors as a recent conflict with a friend who made derogatory statements to his girlfriend and that he was very upset and wanted to fight him. Pt denies SI, HI, and AVH, and states "I'm not trying to catch a charge, I just want to go home. I'm not really going to hurt anyone, I gotta finish school". Pt is intermittently lucid and does not appear to be responding to internal stimuli during this interview. Pt's history through experience with this NP over last 9 yrs has been consistent with pt's current behavior inpatient at [facility]. Slurring of words, usage of slang terms, and talking in an intimidating fashion to staff members is certainly baseline for this patient and he has been consistently acting this way since the age of 62.   *Note: Pt has a history of being non-cooperative  with people he does not know, both Geneticist, molecular and medical staff members. Pt is very guarded unless he feels comfortable and has developed significant rapport with person interviewing him. This has been the case for the 9 yrs I have known him. Pt will give false information, sometimes be belligerent, and overall disruptive to the milieu for attention-seeking (by history and now through current observation here as well).  Diagnosis:   DSM5: Bipolar Affective manic with psychosis Time spent with patient: 25 minutes   Axis I: Schizoaffective Disorder  ADL's:  Intact  Sleep: Poor  Appetite:  Fair  Psychiatric Specialty Exam: Physical Exam  Review of Systems  Constitutional: Negative.   HENT: Negative.   Eyes: Negative.   Respiratory: Negative.   Cardiovascular: Negative.   Gastrointestinal: Negative.   Genitourinary: Negative.   Musculoskeletal: Negative.   Skin: Negative.   Neurological: Negative.   Endo/Heme/Allergies: Negative.   Psychiatric/Behavioral: Positive for hallucinations. The patient is nervous/anxious.     Blood pressure 150/82, pulse 114, temperature 97.6 F (36.4 C), temperature source Oral, resp. rate 18, height 5\' 10"  (1.778 m), weight 84.823 kg (187 lb), SpO2 100 %.Body mass index is 26.83 kg/(m^2).  General Appearance: Fairly Groomed  Patent attorney::  Fair  Speech:  Clear and Coherent, Slurred and All baseline for pt x 9 yrs  Volume:  fluctuates  Mood:  Anxious  Affect:  Labile  Thought Process:  Goal Directed  Orientation:  X 4  Thought Content:  Gang-related (not delusional), pt is actually known to have a legitimate history of gang involvement. 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 The other gang references are likely to be accurate based on my personal experience with the pt.   Suicidal Thoughts:   No  Homicidal Thoughts:  No  Memory:  Immediate;   Fair Recent;   Fair Remote;   Fair  Judgement:  Poor  Insight:  Lacking  Psychomotor Activity:  Restlessness  Concentration:  Poor  Recall:  Poor  Fund of Knowledge:NA  Language: Fair  Akathisia:  No  Handed:    AIMS (if indicated):     Assets:  Housing Social Support  Sleep:  Number of Hours: 3.75   Musculoskeletal: Strength & Muscle Tone: within normal limits Gait & Station: normal Patient leans: N/A  Current Medications: Current Facility-Administered Medications  Medication Dose Route Frequency Provider Last Rate Last Dose  . acetaminophen (TYLENOL) tablet 650 mg  650 mg Oral Q6H PRN Jomarie LongsSaramma Eappen, MD   650 mg at 09/05/14 1137  . benztropine (COGENTIN) tablet 1 mg  1 mg Oral BID Shuvon Rankin, NP   1 mg at 09/05/14 1608  . haloperidol (HALDOL) tablet 10 mg  10 mg Oral Q8H PRN Jomarie LongsSaramma Eappen, MD   10 mg at 09/05/14 1056   And  . LORazepam (ATIVAN) tablet 1 mg  1 mg Oral Q8H PRN Jomarie LongsSaramma Eappen, MD   1 mg at 09/05/14 1531   And  . diphenhydrAMINE (BENADRYL) capsule 50 mg  50 mg Oral Q8H PRN Jomarie LongsSaramma Eappen, MD   50 mg at 09/05/14 1531  . diphenhydrAMINE (BENADRYL) injection 50 mg  50 mg Intramuscular Q8H PRN Jomarie LongsSaramma Eappen, MD       And  . haloperidol lactate (HALDOL) injection 10 mg  10 mg Intramuscular Q8H PRN Jomarie LongsSaramma Eappen, MD       And  . LORazepam (ATIVAN) injection 1 mg  1 mg Intramuscular Q8H PRN Jomarie LongsSaramma Eappen, MD      . divalproex (DEPAKOTE ER) 24 hr tablet 1,500 mg  1,500 mg Oral QHS Shuvon Rankin, NP   1,500 mg at 09/04/14 2043  . hydrOXYzine (ATARAX/VISTARIL) tablet 25 mg  25 mg Oral Q6H PRN Jomarie LongsSaramma Eappen, MD   25 mg at 09/05/14 1531  . magnesium hydroxide (MILK OF MAGNESIA) suspension 30 mL  30 mL Oral Daily PRN Shuvon Rankin, NP   30 mL at 09/04/14 0820  . OLANZapine zydis (ZYPREXA) disintegrating tablet 15 mg  15 mg Oral QHS Rachael FeeIrving A Westyn Driggers, MD   15 mg at 09/04/14 2043  . traZODone (DESYREL) tablet 100 mg   100 mg Oral QHS PRN Shuvon Rankin, NP   100 mg at 09/02/14 2222    Lab Results:  Results for orders placed or performed during the hospital encounter of 09/01/14 (from the past 48 hour(s))  Valproic acid level     Status: Abnormal   Collection Time: 09/05/14  6:30 AM  Result Value Ref Range   Valproic Acid Lvl 102.9 (H) 50.0 - 100.0 ug/mL    Comment: [facility]    Physical Findings: AIMS: Facial and Oral Movements Muscles of Facial Expression: None, normal Lips and Perioral Area: None, normal Jaw: None, normal Tongue: None, normal,Extremity Movements  Upper (arms, wrists, hands, fingers): None, normal Lower (legs, knees, ankles, toes): None, normal, Trunk Movements Neck, shoulders, hips: None, normal, Overall Severity Severity of abnormal movements (highest score from questions above): None, normal Incapacitation due to abnormal movements: None, normal Patient's awareness of abnormal movements (rate only patient's report): No Awareness, Dental Status Current problems with teeth and/or dentures?: No Does patient usually wear dentures?: No  CIWA:    COWS:     Treatment Plan Summary: Daily contact with patient to assess and evaluate symptoms and progress in treatment Medication management  Plan: Supportive approach/coping skills           Improve reality testing           Continue the Zyprexa Zydis to 15 mg HS  Medical Decision Making Problem Points:  Review of psycho-social stressors (1): Improving Data Points:  Review of medication regiment & side effects (2)  I certify that inpatient services furnished can reasonably be expected to improve the patient's condition.   [name], [name], FNP-BC 09/05/2014, 5:56 PM

## 2014-09-05 NOTE — Progress Notes (Signed)
Patient ID: Philip [name], male   DOB: 07-Nov-1990, 23 y.o.   MRN: [id number] D. Patient presents with irritable mood, affect congruent. Thereasa DistanceRodney remains very restless, and intrusive on the unit. He is constantly at nurses station desk or medication window interrupting staff while helping his peers. He continues to be very demanding and restless, cursing on the unit stating '' let me the fuck out of here so I can go smoke a cigarette. i'm a real thug bro i'm going to bust out of here. That doctor told me I could get discharged. My homey needs some meds too. '' He continues to show poor processing of redirection. Staff report concerns of pt slurred speech. Noted elevated LFT and relayed above information with MD. A. Medications given as ordered, including prn medications for agitation. Pt continues to require frequent redirection. R. Pt is safe, in no acute distress at this time. Will continue to monitor q 15 minutes for safety.

## 2014-09-05 NOTE — Progress Notes (Signed)
Patient ID: Philip [name], male   DOB: 10-24-1991, 23 y.o.   MRN: [id number] D)  Has been awake off and on tonight, total sleep time has been about 3.75 hrs.  When he awoke, was psychotic, speech has been slurred,  difficult to understand, able to follow directions for short time before becoming distracted again.  Came into the main hall and urinated on the floor.  Was sent back to his room but unable to stay focused and stay in his room.  Unable to fall back to sleep, became focused on discharge, ativan was repeated, also benadryl given for allergies, sneezing frequently.  Opening the dayroom soon so that he can watch tv, agreed to have vitals taken.   A)  Will continue to monitor for safety. R)  Remains safe on unit.

## 2014-09-05 NOTE — Progress Notes (Signed)
The focus of this group is to help patients review their daily goal of treatment and discuss progress on daily workbooks. Pt did not attend the evening group session, but instead stood outside in the hallway and tapped on the dayroom glass several times during wrap-up.

## 2014-09-05 NOTE — Progress Notes (Signed)
D: The patient was found laying in bed in the wrong bedroom approximately 30 minutes ago.  A: The patient was redirected to leave the bedroom.  R: The patient left the bedroom and returned to the hallway. He admitted to entering the wrong bedroom and apologized.

## 2014-09-05 NOTE — [facility] Group Notes (Signed)
[facility] LCSW Group Therapy  09/05/2014 12:56 PM  Type of Therapy:  Group Therapy  Participation Level:  Active  Participation Quality:  Attentive, Intrusive, Monopolizing and Redirectable  Affect:  Not Congruent  Cognitive:  Confused  Insight:  None  Engagement in Therapy:  Off Topic  Modes of Intervention:  Education  Summary of Progress/Problems:Today's group was about coping with life situations using a positive attitude. The group described attitude and identify both good and bad attitudes, the consequences and benefits of both.  Group further discussed which attitude to have and how to develop a good/positive attitude.  Closed by group sharing how each person can do something to have a better attitude. Patient was very agitated and frequently left in and out of group.   Beverly Sessionsywan J Lindsey MSW, LCSW   Clide DalesHarrill, Catherine Campbell 09/05/2014, 12:56 PM

## 2014-09-06 MED ORDER — ZIPRASIDONE MESYLATE 20 MG IM SOLR
INTRAMUSCULAR | Status: AC
  Administered 2014-09-06: 20 mg via INTRAMUSCULAR
  Filled 2014-09-06: qty 20

## 2014-09-06 MED ORDER — DIVALPROEX SODIUM ER 500 MG PO TB24
1250.0000 mg | ORAL_TABLET | Freq: Every day | ORAL | Status: DC
  Administered 2014-09-06: 1250 mg via ORAL
  Filled 2014-09-06 (×2): qty 1

## 2014-09-06 MED ORDER — OLANZAPINE 10 MG PO TBDP
20.0000 mg | ORAL_TABLET | Freq: Every day | ORAL | Status: DC
  Administered 2014-09-06 – 2014-09-09 (×4): 20 mg via ORAL
  Filled 2014-09-06 (×5): qty 2

## 2014-09-06 MED ORDER — ZIPRASIDONE MESYLATE 20 MG IM SOLR
20.0000 mg | Freq: Once | INTRAMUSCULAR | Status: AC
  Administered 2014-09-06: 20 mg via INTRAMUSCULAR
  Filled 2014-09-06: qty 20

## 2014-09-06 MED ORDER — LACTULOSE 10 GM/15ML PO SOLN
20.0000 g | Freq: Three times a day (TID) | ORAL | Status: DC
  Administered 2014-09-06 – 2014-09-07 (×5): 20 g via ORAL
  Filled 2014-09-06 (×9): qty 30

## 2014-09-06 NOTE — Progress Notes (Signed)
Patient ID: Philip [name], male   DOB: June 29, 1991, 23 y.o.   MRN: [id number] D)  Has been out and about on the hall this evening, redirectable but sometimes hard to understand.  Thoughts are disorganized, disoriented, poor hygiene, labile.  Has been redirected not to go into other rooms.  Needs frequent redirection and attention to maintain boundaries, irritating others on the hall. A)  Will continue to monitor for safety R)  Safety maintained at this time.

## 2014-09-06 NOTE — Progress Notes (Signed)
Patient ID: Philip [name], male   DOB: 1990/11/26, 23 y.o.   MRN: [id number] D. Patient presents with irritable mood, affect congruent. Philip [name] very restless, and intrusive on the unit again today. He is also, noted to be more confused, with slurred speech. Philip [name] very intrusive, and threatening at times on the unit, stating '' I'm going to go get some vicodin to put in my drink. I want to go smoke, you got a light for a cigarette! ''  He continues to show poor processing of redirection and requires constant redirection from 1.1. Staff. A. Noted above information and relayed information with Renata Capriceonrad NP as well as lab reports (elevated ammonia).  A. Medications given as ordered, including prn medications for agitation as patient threatening staff and easily agitated. Pt continues to require frequent redirection. R. Pt is safe, in no acute distress at this time. Will continue to monitor 1.1 for safety.

## 2014-09-06 NOTE — Progress Notes (Signed)
Patient ID: Philip [name], male   DOB: [birth year], 23 y.o.   MRN: [id number] Called by nursing -pt is agitated /wandering/entering rooms not his own-has order for Haldol but requests Geodon which he had 2 nites ago according to nursing.He also had had 1mg  of ativan without response.Agreed to order Geodon in place of Haldol pt refused to avoid forced medication and also ordered additional 1 mg of Ativan

## 2014-09-06 NOTE — Progress Notes (Signed)
Pt posturing, threatening staff and stating he was going to leave. Pt was having an issue with pt in 507. Pt continues to be redirectable.

## 2014-09-06 NOTE — Progress Notes (Signed)
Patient ID: Philip [name], male   DOB: [birth year], 23 y.o.   MRN: [id number] 1-1 monitoring note. D. Pt ordered 1.1. Monitoring for safety. Pt is confused, restless, easily agitated and intrusive. He requires frequent verbal redirection. A. 1-1 staff remain at arms length. R. Pt is safe. Will continue to monitor q 15 minutes for safety.

## 2014-09-06 NOTE — Progress Notes (Signed)
Patient ID: Philip [name], male   DOB: 10/04/1991, 23 y.o.   MRN: [id number] 1-1 monitoring note. D. Pt monitored for 1.1. For safety. A. Pt continues to require frequent redirection, is confused and restless. R. 1.1. At arms length. Pt is safe.  

## 2014-09-06 NOTE — [facility] Group Notes (Signed)
[facility] Group Notes:  (Nursing/MHT/Case Management/Adjunct)  Date:  09/06/2014  Time:  3:03 PM  Type of Therapy:  Psychoeducational Skills-Healthy Support Systems.   Participation Level:  Did Not Attend  Nelline Lio Shanta 09/06/2014, 3:03 PM 

## 2014-09-06 NOTE — [facility] Group Notes (Signed)
[facility] LCSW Group Therapy  09/06/2014 3:07 PM  Type of Therapy:  Group Therapy  Participation Level:  Did Not Attend  Participation Quality:  N/A  Affect:   N/A  Cognitive:  N/A  Insight:  N/A  Engagement in Therapy:  N/A  Modes of Intervention:  Discussion, Education, Exploration, Rapport Building and Support  Summary of Progress/Problems: Pt did not attend.  Seabron SpatesVaughn, Malcom Selmer Anne 09/06/2014, 3:07 PM

## 2014-09-06 NOTE — [facility] Group Notes (Signed)
[facility] Group Notes:  (Nursing/MHT/Case Management/Adjunct)  Date:  09/06/2014  Time:  3:00 PM  Type of Therapy:  Psychoeducational Skills-Pt self inventory group.   Participation Level:  Did Not Attend  Philip [name] 09/06/2014, 3:00 PM 
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
## 2014-09-06 NOTE — Progress Notes (Signed)
Patient ID: Philip [name], male   DOB: 1990/12/21, 23 y.o.   MRN: [id number] 1-1 monitoring note. D. Pt monitored for 1.1. For safety. A. Pt continues to require frequent redirection, is confused and restless. R. 1.1. At arms length. Pt is safe.

## 2014-09-06 NOTE — Progress Notes (Signed)
Nursing 1:1 note D:Pt pacing hall, pt continues to get irritable with staff. RR even and unlabored. No distress noted. Pt continues to be redirectable, but constantly needs to be redirected.   A: 1:1 observation continues for safety  R: pt remains safe

## 2014-09-06 NOTE — Progress Notes (Addendum)
Patient ID: Philip [name], male   DOB: 1991/03/08, 23 y.o.   MRN: [id number] D)  Has been placed on 1:1 obs d/t agitation and making verbal threats as well as threatening physical gestures.  PA on call notified and also ordered Geodon 20 mg IM and Ativan 1 mg IM.  Pt agreed to take the injections and was given a snack afterward before going back to his room and going to bed. A)  Remains on 1:1 obs for safety, MHT at bedside for safety. R)  Remains safe on unit.

## 2014-09-07 DIAGNOSIS — E7220 Disorder of urea cycle metabolism, unspecified: Secondary | ICD-10-CM

## 2014-09-07 LAB — COMPREHENSIVE METABOLIC PANEL
ALBUMIN: 3.8 g/dL (ref 3.5–5.2)
ALT: 33 U/L (ref 0–53)
AST: 26 U/L (ref 0–37)
Alkaline Phosphatase: 87 U/L (ref 39–117)
Anion gap: 15 (ref 5–15)
BUN: 9 mg/dL (ref 6–23)
CALCIUM: 9.6 mg/dL (ref 8.4–10.5)
CO2: 24 mEq/L (ref 19–32)
CREATININE: 1.16 mg/dL (ref 0.50–1.35)
Chloride: 100 mEq/L (ref 96–112)
GFR calc Af Amer: >90 mL/min (ref >90)
GFR, EST NON AFRICAN AMERICAN: 86 mL/min — AB (ref >90)
Glucose, Bld: 105 mg/dL — ABNORMAL HIGH (ref 70–99)
Potassium: 3.9 mEq/L (ref 3.7–5.3)
SODIUM: 139 meq/L (ref 137–147)
TOTAL PROTEIN: 7.6 g/dL (ref 6.0–8.3)
Total Bilirubin: 0.2 mg/dL — ABNORMAL LOW (ref 0.3–1.2)

## 2014-09-07 LAB — AMMONIA: AMMONIA: 60 umol/L (ref 11–60)

## 2014-09-07 MED ORDER — TRAZODONE HCL 100 MG PO TABS
100.0000 mg | ORAL_TABLET | Freq: Every day | ORAL | Status: DC
  Administered 2014-09-07: 100 mg via ORAL
  Filled 2014-09-07 (×3): qty 1

## 2014-09-07 MED ORDER — LITHIUM CARBONATE 300 MG PO CAPS
300.0000 mg | ORAL_CAPSULE | Freq: Two times a day (BID) | ORAL | Status: DC
  Administered 2014-09-07 – 2014-09-10 (×6): 300 mg via ORAL
  Filled 2014-09-07 (×8): qty 1

## 2014-09-07 NOTE — Consult Note (Signed)
[facility] Medical Consultation  Philip [name] JJO:[id number] DOB: [birth year] DOA: 09/01/2014   PCP: unknown  Requesting physician: Dr. Shea Evans Date of consultation: 09/07/2014 Reason for consultation: elevated ammonia  Chief Complaint: confusion  HPI: Philip [name] is a 23 y.o. male  With a past medical history of schizoaffective disorder who was admitted on November 4 to behavioral health as the patient had been walking around all at home talking to himself. Apparently, patient has been noncompliant with his medications for bipolar disorder and schizoaffective disorder. His mother had him involuntarily committed. He was subsequently admitted and started on psychotropic medications. This included Depakote. Apparently he was initially improving but then over the course of the weekend he started becoming confused. An ammonia level was obtained which was elevated. Depakote level was also borderline elevated. He was started on lactulose. We were called for further input regarding this issue. Patient has tangential thoughts. He is unable to provide any information whatsoever regarding what might be happening with him. He denies any pain. History was very limited and obtained mostly from previous reports. Apparently over the last 2 days he has been very easily agitated and irritable. His been pacing the hallway.   Home Medications: Prior to Admission medications   Medication Sig Start Date End Date Taking? Authorizing Provider  benztropine (COGENTIN) 1 MG tablet Take 1 tablet (1 mg total) by mouth 2 (two) times daily. 06/30/12   Julianne Rice, MD  divalproex (DEPAKOTE ER) 500 MG 24 hr tablet Take 3 tablets (1,500 mg total) by mouth at bedtime. 06/30/12 06/30/13  Julianne Rice, MD  QUEtiapine (SEROQUEL) 50 MG tablet Take 50 mg by mouth 2 (two) times daily.     Historical Provider, MD  QUEtiapine (SEROQUEL) 50 MG tablet Take 1 tablet (50 mg total) by mouth at bedtime. 08/26/14   Mojeed Akintayo   risperiDONE (RISPERDAL) 3 MG tablet Take 1 tablet (3 mg total) by mouth 2 (two) times daily. 06/30/12   Julianne Rice, MD  traZODone (DESYREL) 50 MG tablet Take 1 tablet (50 mg total) by mouth at bedtime as needed for sleep. 08/26/14   Mojeed Akintayo  venlafaxine (EFFEXOR) 75 MG tablet Take 75 mg by mouth every morning.     Historical Provider, MD    Current Inpatient Medications:  Scheduled: . benztropine  1 mg Oral BID  . lactulose  20 g Oral TID  . lithium carbonate  300 mg Oral BID WC  . OLANZapine zydis  20 mg Oral QHS  . traZODone  100 mg Oral QHS   Continuous:  [id number], haloperidol **AND** [DISCONTINUED] LORazepam **AND** diphenhydrAMINE, haloperidol lactate **AND** [DISCONTINUED] LORazepam **AND** diphenhydrAMINE, hydrOXYzine, magnesium hydroxide  Allergies:  Allergies  Allergen Reactions  . Shrimp [Shellfish Allergy] Anaphylaxis    Past Medical History: Past Medical History  Diagnosis Date  . Bipolar affective disorder   . Schizophrenia   . ADHD (attention deficit hyperactivity disorder)     History reviewed. No pertinent past surgical history.  Social History:  History   Social History  . Marital Status: Married    Spouse Name: N/A    Number of Children: N/A  . Years of Education: N/A   Occupational History  . Not on file.   Social History Main Topics  . Smoking status: Current Every Day Smoker -- 1.00 packs/day for 6 years  . Smokeless tobacco: Not on file  . Alcohol Use: 0.6 oz/week    1 Cans of beer per week  . Drug Use: Yes  Special: Marijuana  . Sexual Activity: Not Currently   Other Topics Concern  . Not on file   Social History Narrative    Family History:  Family History  Problem Relation Age of Onset  . Hypertension Mother      Review of Systems - unable to obtain due to his mental status  Physical Examination: Filed Vitals:   09/04/14 0601 09/06/14 0827 09/07/14 0550 09/07/14 0551  BP: 150/82 133/105 123/81  122/80  Pulse: 114 94 104 115  Temp:  98 F (36.7 C) 97.8 F (36.6 C)   TempSrc:  Oral Oral   Resp:  20 18   Height:      Weight:      SpO2:       Examination was limited as it was difficult for the patient to listen to my instructions.  General appearance: alert, distracted, no distress and with tangential thoughts. Head: Normocephalic, without obvious abnormality, atraumatic Eyes: conjunctivae/corneas clear. PERRL, EOM's intact.  Resp: clear to auscultation bilaterally Cardio: regular rate and rhythm, S1, S2 normal, no murmur, click, rub or gallop GI: soft, non-tender; bowel sounds normal; no masses,  no organomegaly Extremities: extremities normal, atraumatic, no cyanosis or edema Neurologic: he is alert. Somewhat agitated. Would not answer my orientation questions. Moving all his extremities.  Laboratory Data: Results for orders placed or performed during the hospital encounter of 09/01/14 (from the past 48 hour(s))  Ammonia     Status: Abnormal   Collection Time: 09/05/14  7:25 PM  Result Value Ref Range   Ammonia 75 (H) 11 - 60 umol/L    Comment: Performed at [facility]  Comprehensive metabolic panel     Status: Abnormal   Collection Time: 09/05/14  7:25 PM  Result Value Ref Range   Sodium 138 137 - 147 mEq/L   Potassium 4.3 3.7 - 5.3 mEq/L   Chloride 101 96 - 112 mEq/L   CO2 25 19 - 32 mEq/L   Glucose, Bld 111 (H) 70 - 99 mg/dL   BUN 9 6 - 23 mg/dL   Creatinine, Ser 1.02 0.50 - 1.35 mg/dL   Calcium 10.0 8.4 - 10.5 mg/dL   Total Protein 7.4 6.0 - 8.3 g/dL   Albumin 3.7 3.5 - 5.2 g/dL   AST 24 0 - 37 U/L   ALT 41 0 - 53 U/L   Alkaline Phosphatase 88 39 - 117 U/L   Total Bilirubin <0.2 (L) 0.3 - 1.2 mg/dL   GFR calc non Af Amer >90 >90 mL/min   GFR calc Af Amer >90 >90 mL/min    Comment: (NOTE) The eGFR has been calculated using the CKD EPI equation. This calculation has not been validated in all clinical situations. eGFR's persistently <90  mL/min signify possible Chronic Kidney Disease.    Anion gap 12 5 - 15    Comment: Performed at [facility]    Imaging Studies: No results found.  EKG: EKG was done on November 4 and shows sinus rhythm with normal axis. No concerning ST or T-wave changes.  Impression/Recommendations  Active Problems:   Psychotic disorder   Schizoaffective disorder, bipolar type   Hyperammonemia   This is a 23 year old African-American male with a history of schizoaffective disorder who was admitted 5 days ago to behavioral health. Patient was detected to have elevated ammonia level on November 7. He was started on lactulose. Hyperammonemia is known side effect of Depakote. This is the most likely reason for his elevated  ammonia. There is no history of liver disease. He apparently does not drink heavily. His LFTs were normal.  #1 hyperammonemia: Most likely secondary to Depakote. We agree with discontinuing Depakote. Will continue lactulose for now. His confusion does not appear to be entirely from elevated ammonia level. I think this is mostly psychiatric. Will recommend continuing to check ammonia levels on a daily basis till it starts coming down. I have discussed the patient's situation with his nurse and with Dr. Shea Evans. He has been following commands. He has been taking his medicines. There's been no history of nausea and vomiting. At this time there is no need to transfer the patient to acute inpatient setting. We will also recommend checking LFTs again. As his ammonia level starts decreasing, lactulose can be slowly tapered off.  [facility] will followup again tomorrow. Please contact me if I can be of assistance in the meanwhile. Thank you for this consultation.  [facility] Pager [phone]  If 7PM-7AM, please contact night-coverage.  [email or web address] Password [email or web address]  09/07/2014, 2:02 PM

## 2014-09-07 NOTE — Progress Notes (Signed)
Observation note: Pt observed in his room with sitter present. Pt agitated, labile and delusional. Pt continues to presents with mild confusion. Pt requested prn med for agitation. Pt upset because he is tired of being in the hosp. Writer administered prn med at pt's request. Pt continues to be on 1:1 observation for inappropriate behaviors and requires frequent redirecting. Pt intrusive and have been noted to be laying in both beds in his current room since transferred to a new room.

## 2014-09-07 NOTE — Progress Notes (Signed)
Patient ID: Philip [name], male   DOB: [birth year], 23 y.o.   MRN: [id number] D: Client eyes closed, respirations even, unlabored. A: Writer assess for s/s of distress. Staff will monitor 1:1 for safety. R: Client is safe on the unit, no distress noted.

## 2014-09-07 NOTE — Progress Notes (Signed)
Psychoeducational Group Note  Date:  09/07/2014 Time:  0052  Group Topic/Focus:  Wrap-Up Group:   The focus of this group is to help patients review their daily goal of treatment and discuss progress on daily workbooks.  Participation Level: Did Not Attend  Participation Quality:  Not Applicable  Affect:  Not Applicable  Cognitive:  Not Applicable  Insight:  Not Applicable  Engagement in Group: Not Applicable  Additional Comments:  The patient did not attend group last evening.   Eddie Koc S 09/07/2014, 12:52 AM

## 2014-09-07 NOTE — Progress Notes (Signed)
The focus of this group is to help patients review their daily goal of treatment and discuss progress on daily workbooks. Pt attended the last five minutes of wrap-up group and responded minimally to discussion prompts from the Writer. Pt said that all he needed in life was some cigars to smoke and that he planned to walk out of this facility if we don't let him smoke them. Pt's affect was drowsy.

## 2014-09-07 NOTE — Progress Notes (Signed)
Patient ID: Philip [name], male   DOB: 08-Sep-1991, 23 y.o.   MRN: [id number] D) Pt was medicated at the end of previous shift and has been sleeping tonight.  Had been irritable and agitated by comments made to pt in next room.  Was finally able to lie down and allow meds to work and pt in next room redirected to keep door closed.  Currently resting with eyes closed, resp reg, unlabored, no c/o's voiced. A)  Remains on 1:1 obs for safety, mht at bedside.  R)  Safety maintained.

## 2014-09-07 NOTE — Progress Notes (Signed)
[facility] MD Progress Note  09/07/2014 12:23 PM Philip [name]  MRN:  [id number] Subjective:Pt states,'I want to be discharged ".  Objective:   Pt seen and chart reviewed.Pt appears to be very groggy ,drowsy and also is confused about date. He however is alert and oriented to person ,place and situation. Pt has a sitter beside him for safety reasons as well as 2/2 periodic disruptive behavior on the unit. Pt also has been having altercations with other pts on the unit.  Pt has hyperammonemia ,likely 2/2 depakote. Pt is currently on lactulose. Will discontinue Depakote and will start pt on another mood stabilizer. Pt continues to be anxious ,agitated at times,pacing and has periods of irritability. Pt continues to be disorganized ,but denies SI/AH/VH/HI.     Diagnosis:  DSM5: Primary Psychiatric Diagnosis: Schizoaffective disorder,bipolar type, multiple episodes ,currently in acute episode   Secondary Psychiatric Diagnosis: Cannabis use disorder Hx of ADHD   Non Psychiatric Diagnosis: Hyperammonemia      Time spent with patient: 30 minutes    ADL's:  Intact  Sleep: Poor  Appetite:  Good  Psychiatric Specialty Exam: Physical Exam  Review of Systems  Constitutional: Negative.   HENT: Negative.   Eyes: Negative.   Respiratory: Negative.   Cardiovascular: Negative.   Gastrointestinal: Negative.   Genitourinary: Negative.   Musculoskeletal: Negative.   Skin: Negative.   Neurological: Negative.   Endo/Heme/Allergies: Negative.   Psychiatric/Behavioral: Negative for hallucinations. The patient is nervous/anxious.     Blood pressure 122/80, pulse 115, temperature 97.8 F (36.6 C), temperature source Oral, resp. rate 18, height _0  (1.778 m), weight 84.823 kg (187 lb), SpO2 100 %.Body mass index is 26.83 kg/(m^2).  General Appearance: Fairly Groomed  Engineer, water::  Falls; pressured ,slurred at times ,but per previous notes in EMR -this he has at  baseline   Volume:  fluctuates  Mood:  Anxious  Affect:  Labile  Thought Process:  Irrelevant and Loose  Orientation:  X 4  Thought Content:  Denies AH/VH ,but continues to be disorganized. Per previous notes in EMR ,pt does talk about gang related issues which are not delusions but real.  Suicidal Thoughts:  No  Homicidal Thoughts:  No  Memory:  Immediate;   Fair Recent;   Fair Remote;   Fair  Judgement:  Poor  Insight:  Lacking  Psychomotor Activity:  Restlessness  Concentration:  Poor  Recall:  Poor  Fund of Knowledge:NA  Language: Fair  Akathisia:  No  Handed:    AIMS (if indicated):     Assets:  Housing Social Support  Sleep:  Number of Hours: 2.75   Musculoskeletal: Strength & Muscle Tone: within normal limits Gait & Station: normal Patient leans: N/A  Current Medications: Current Facility-Administered Medications  Medication Dose Route Frequency Provider Last Rate Last Dose  . acetaminophen (TYLENOL) tablet 650 mg  650 mg Oral Q6H PRN Ursula Alert, MD   650 mg at 09/05/14 1137  . benztropine (COGENTIN) tablet 1 mg  1 mg Oral BID Shuvon Rankin, NP   1 mg at 09/07/14 0808  . haloperidol (HALDOL) tablet 10 mg  10 mg Oral Q8H PRN Ursula Alert, MD   10 mg at 09/07/14 0630   And  . LORazepam (ATIVAN) tablet 1 mg  1 mg Oral Q8H PRN Ursula Alert, MD   1 mg at 09/07/14 0630   And  . diphenhydrAMINE (BENADRYL) capsule 50 mg  50 mg Oral Q8H PRN Ursula Alert, MD  50 mg at 09/07/14 0558  . diphenhydrAMINE (BENADRYL) injection 50 mg  50 mg Intramuscular Q8H PRN Ursula Alert, MD       And  . haloperidol lactate (HALDOL) injection 10 mg  10 mg Intramuscular Q8H PRN Ursula Alert, MD   10 mg at 09/06/14 1037   And  . LORazepam (ATIVAN) injection 1 mg  1 mg Intramuscular Q8H PRN Ursula Alert, MD   1 mg at 09/06/14 1037  . hydrOXYzine (ATARAX/VISTARIL) tablet 25 mg  25 mg Oral Q6H PRN Ursula Alert, MD   25 mg at 09/07/14 0558  . lactulose (CHRONULAC) 10 GM/15ML solution 20 g   20 g Oral TID Benjamine Mola, FNP   20 g at 09/07/14 1148  . lithium carbonate capsule 300 mg  300 mg Oral BID WC Aribella Vavra, MD      . magnesium hydroxide (MILK OF MAGNESIA) suspension 30 mL  30 mL Oral Daily PRN Shuvon Rankin, NP   30 mL at 09/04/14 0820  . OLANZapine zydis (ZYPREXA) disintegrating tablet 20 mg  20 mg Oral QHS Janett Labella, NP   20 mg at 09/06/14 2203  . traZODone (DESYREL) tablet 100 mg  100 mg Oral QHS PRN Shuvon Rankin, NP   100 mg at 09/06/14 2313    Lab Results:  Results for orders placed or performed during the hospital encounter of 09/01/14 (from the past 48 hour(s))  Ammonia     Status: Abnormal   Collection Time: 09/05/14  7:25 PM  Result Value Ref Range   Ammonia 75 (H) 11 - 60 umol/L    Comment: Performed at [facility]  Comprehensive metabolic panel     Status: Abnormal   Collection Time: 09/05/14  7:25 PM  Result Value Ref Range   Sodium 138 137 - 147 mEq/L   Potassium 4.3 3.7 - 5.3 mEq/L   Chloride 101 96 - 112 mEq/L   CO2 25 19 - 32 mEq/L   Glucose, Bld 111 (H) 70 - 99 mg/dL   BUN 9 6 - 23 mg/dL   Creatinine, Ser 1.02 0.50 - 1.35 mg/dL   Calcium 10.0 8.4 - 10.5 mg/dL   Total Protein 7.4 6.0 - 8.3 g/dL   Albumin 3.7 3.5 - 5.2 g/dL   AST 24 0 - 37 U/L   ALT 41 0 - 53 U/L   Alkaline Phosphatase 88 39 - 117 U/L   Total Bilirubin <0.2 (L) 0.3 - 1.2 mg/dL   GFR calc non Af Amer >90 >90 mL/min   GFR calc Af Amer >90 >90 mL/min    Comment: (NOTE) The eGFR has been calculated using the CKD EPI equation. This calculation has not been validated in all clinical situations. eGFR's persistently <90 mL/min signify possible Chronic Kidney Disease.    Anion gap 12 5 - 15    Comment: Performed at [facility]    Physical Findings: AIMS: Facial and Oral Movements Muscles of Facial Expression: None, normal Lips and Perioral Area: None, normal Jaw: None, normal Tongue: None, normal,Extremity Movements Upper  (arms, wrists, hands, fingers): None, normal Lower (legs, knees, ankles, toes): None, normal, Trunk Movements Neck, shoulders, hips: None, normal, Overall Severity Severity of abnormal movements (highest score from questions above): None, normal Incapacitation due to abnormal movements: None, normal Patient's awareness of abnormal movements (rate only patient's report): No Awareness, Dental Status Current problems with teeth and/or dentures?: No Does patient usually wear dentures?: No  CIWA:  COWS:     Treatment Plan Summary: Daily contact with patient to assess and evaluate symptoms and progress in treatment Medication management  Assessment/Plan:Pt is a 23 year old AAM ,who has a hx of schizoaffective disorder as well as cannabis use do ,who has had multiple admissions to [facility] well as [facility] and who was being followed atb [facility], presented this time with decompensation. Pt however ,over the week end developed hyperammonemia (likely depakote induced) ,and is currently confused ,with periods of irritability.  Supportive approach/coping skills Continue the Zyprexa Zydis 20 mg po HS. Will continue prn medications for agitation. However will dc Ativan prn since he is confused. Will discontinue Depakote 2/2 hyperammonemia and will start Lithium 300 mg po bid for mood lability. Continue Lactulose. Will check CMP and Ammonia level tomorrow. Also placed hospitalist consult -they will follow patient. Will continue Trazodone as scheduled, but will change it to qhs instead of prn. Pt has been sleeping during day time ,that likely could be causing sleep difficulties at night. Will continue sitter for 1;1 precaution for safety reasons. Pt to participate in treatment milieu.   Medical Decision Making Problem Points:  Established problem, worsening (2), New problem, with additional work-up planned (4), Review of last therapy session (1) and Review of psycho-social stressors (1):  Improving Data Points:  Review or order clinical lab tests (1) Review or order medicine tests (1) Review of medication regiment & side effects (2) Review of new medications or change in dosage (2)  I certify that inpatient services furnished can reasonably be expected to improve the patient's condition.   Kosta Schnitzler, MD 09/07/2014, 12:23 PM

## 2014-09-07 NOTE — Progress Notes (Signed)
Observation note: Pt observed in the hallway with sitter present. Pt continue to pace hallway, taking several items out of his room and placing them in the hallway and at the nurses station. Pt continues to present with mild confusion and requires frequent redirecting. Pt remains on 1:1 observation until d/c'd.

## 2014-09-07 NOTE — [facility] Group Notes (Signed)
[facility] LCSW Group Therapy  09/07/2014 1:51 PM  Type of Therapy:  Group Therapy  Participation Level: Invited- Did Not Attend  Summary of Progress/Problems: Today's Topic: Overcoming Obstacles. Group members identified obstacles faced currently and processed barriers involved in overcoming these obstacles. Group members identified steps necessary for overcoming these obstacles and explored motivation (internal and external) for facing these difficulties head on. Group members further identified one area of concern in their lives and chose a skill of focus pulled from their "toolbox."   Smart, [place] LCSWA 09/07/2014, 1:51 PM

## 2014-09-07 NOTE — Progress Notes (Signed)
Observation note: Pt observed in the hallway with sitter present. Pt easily agitated and irritable this morning on approach. Pt remains labile and threatening. Pt appears sedated and continues to pace hallway. Pt encouraged to rest in bed. Pt noncompliant with recommendations at this time. Pt presents with mild confusion and delusions. Pt is A&O to self and time. Pt remains on 1:1 observation for safety and inappropriate behaviors on the unit. Pt requires frequent redirecting from staff and sitter.

## 2014-09-08 DIAGNOSIS — F1220 Cannabis dependence, uncomplicated: Secondary | ICD-10-CM | POA: Insufficient documentation

## 2014-09-08 LAB — AMMONIA: AMMONIA: 65 umol/L — AB (ref 11–60)

## 2014-09-08 MED ORDER — TRAZODONE HCL 150 MG PO TABS
150.0000 mg | ORAL_TABLET | Freq: Every day | ORAL | Status: DC
  Filled 2014-09-08 (×3): qty 1

## 2014-09-08 MED ORDER — BENZTROPINE MESYLATE 0.5 MG PO TABS
0.5000 mg | ORAL_TABLET | Freq: Three times a day (TID) | ORAL | Status: DC | PRN
  Administered 2014-09-09: 0.5 mg via ORAL
  Filled 2014-09-08: qty 1

## 2014-09-08 MED ORDER — HALOPERIDOL LACTATE 5 MG/ML IJ SOLN: 5.0000 mg | Freq: Three times a day (TID) | INTRAMUSCULAR | Status: DC | PRN

## 2014-09-08 MED ORDER — LORAZEPAM 2 MG/ML IJ SOLN
INTRAMUSCULAR | Status: AC
  Filled 2014-09-08: qty 1

## 2014-09-08 MED ORDER — HALOPERIDOL 5 MG PO TABS
5.0000 mg | ORAL_TABLET | Freq: Three times a day (TID) | ORAL | Status: DC | PRN
  Administered 2014-09-09 – 2014-09-10 (×3): 5 mg via ORAL
  Filled 2014-09-08 (×2): qty 1

## 2014-09-08 MED ORDER — LACTULOSE 10 GM/15ML PO SOLN
20.0000 g | Freq: Two times a day (BID) | ORAL | Status: DC
Start: 2014-09-08 — End: 2014-09-08
  Filled 2014-09-08 (×3): qty 30

## 2014-09-08 MED ORDER — BENZTROPINE MESYLATE 1 MG/ML IJ SOLN: 0.5000 mg | Freq: Three times a day (TID) | INTRAMUSCULAR | Status: DC | PRN

## 2014-09-08 MED ORDER — LACTULOSE 10 GM/15ML PO SOLN
20.0000 g | Freq: Three times a day (TID) | ORAL | Status: DC
  Administered 2014-09-08 – 2014-09-10 (×8): 20 g via ORAL
  Filled 2014-09-08 (×10): qty 30

## 2014-09-08 NOTE — [facility] Group Notes (Signed)
The focus of this group is to educate the patient on the purpose and policies of crisis stabilization and provide a format to answer questions about their admission.  The group details unit policies and expectations of patients while admitted. Patient did not attend this group. 

## 2014-09-08 NOTE — [facility] Suicide Risk Assessment (Signed)
[facility] INPATIENT:  Family/Significant Other Suicide Prevention Education  Suicide Prevention Education:  Education Completed; No one has been identified by the patient as the family member/significant other with whom the patient will be residing, and identified as the person(s) who will aid the patient in the event of a mental health crisis (suicidal ideations/suicide attempt).  With written consent from the patient, the family member/significant other has been provided the following suicide prevention education, prior to the and/or following the discharge of the patient.  The suicide prevention education provided includes the following:  Suicide risk factors  Suicide prevention and interventions  National Suicide Hotline telephone number  [facility] assessment telephone number  [facility] Assistance 911  [facility] and/or [facility] telephone number  Request made of family/significant other to:  Remove weapons (e.g., guns, rifles, knives), all items previously/currently identified as safety concern.    Remove drugs/medications (over-the-counter, prescriptions, illicit drugs), all items previously/currently identified as a safety concern.  The family member/significant other verbalizes understanding of the suicide prevention education information provided.  The family member/significant other agrees to remove the items of safety concern listed above. The patient did not endorse SI at the time of admission, nor did the patient c/o SI during the stay here.  SPE not required.   Philip [name], Brayn B 09/08/2014, 11:36 AM

## 2014-09-08 NOTE — Clinical Social Work Note (Signed)
Spoke with uncle, Philip [name], with pt present.  Philip [name] had identified Philip [name] as one of his main positive supports.  Philip [name] stated that Philip [name] had a previous episode a year ago, that he had recovered to a "normal" level, and that Philip [name] is "hard headed" about not taking meds and hanging out with negative peers.  Philip [name] punctuated this point nicely by stating that he is a "grown man" capable of making his own decisions and he doesn't need anyone telling him what to do. Philip [name] came across as someone who is genuinely concerned about Philip [name] and wants to help if Philip [name] will allow it.  Philip [name] demonstrated remarkably limited insight and an understanding of his situation that is not based in reality.  For instance, "I work two jobs, have my own place and drive my own car."

## 2014-09-08 NOTE — Progress Notes (Signed)
D: Patient's affect is anxious and mood is labile. He reported on the self inventory sheet that his sleep, appetite and ability to concentrate are all good and energy level is normal. Patient rates depression/anxiety "5" and feelings of hopelessness "1". He has not participated in any group sessions today, but he is visible in the milieu. Patient is compliant with medication regimen.  A: Support and encouragement provided to patient. Scheduled medications administered per MD orders. Maintain Q15 minute checks for safety.  R: Patient receptive. Denies SI/HI and AVH. Patient remains safe.

## 2014-09-08 NOTE — Progress Notes (Signed)
1:1 Nursing Note  D: Patient is walking up and down the hallway at this time, singing aloud.  A: Continue to monitor 1:1 patient observation.  R: Patient remains safe on the unit.

## 2014-09-08 NOTE — Progress Notes (Signed)
The focus of this group is to help patients review their daily goal of treatment and discuss progress on daily workbooks. Pt attended the evening group session but did not directly respond to discussion prompts from the Writer. Pt told the group he had a good day, but would not say why, and said that he was going to have a good night. "I'm just gonna chill here. We're all chill." Pt volunteered several comments to his peers during group, some encouraging and well articulated while others were inappropriate and unintelligible. Pt's affect was drowsy and silly.

## 2014-09-08 NOTE — Progress Notes (Signed)
Patient ID: Philip [name], male   DOB: Dec 19, 1990, 23 y.o.   MRN: [id number] D: Client up irritable, agitated, reporting he did not want the male sitter that was assigned to sit with him, cursing, rapping, rhyming. A: Writer attempted to redirect client. Administered Haldol 10 mg po. Staff to monitor 1:1 for safety. R: client is safe on the unit.

## 2014-09-08 NOTE — Progress Notes (Signed)
1:1 Nursing Note  D: Patient is in his room searching under his bed for a cigarette; RN informed patient that you can not have cigarettes on the unit. Patient's affect flat/blunted and mood is labile; no s/s of distress noted.  A: Maintain 1:1 close observation and Q15 minute checks for safety.  R: Patient receptive. Denies SI/HI and AVH. Patient remains safe on the hall.

## 2014-09-08 NOTE — Progress Notes (Signed)
1:1 Nursing Note  D: Patient standing at nurses station having a conversation with MHT/sitter; even labored breathing; no s/s of distress noted.  A: Monitor Q15 minute checks and 1:1 patient observation for safety.  R: Patient safe on the hall.

## 2014-09-08 NOTE — Progress Notes (Addendum)
Ammonia level slightly higher today.  Will continue lactulose to TID today and repeat ammonia level in AM.

## 2014-09-08 NOTE — Progress Notes (Signed)
Patient ID: Philip [name], male   DOB: 1990/11/09, 23 y.o.   MRN: [id number] D: Client on the unit, reports "give me something", anxious, demanding. A: Writer gave Vistaril 25 mg po. Staff will maintain 1:1 for safety. R: Client is safe on the unit, attended group.

## 2014-09-08 NOTE — Progress Notes (Signed)
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  Objective:   Pt seen and chart reviewed.Pt is more alert today than yesterday ,reports the month as November and year as 2015. Pt however continues to have pressured speech ,easily agitated ,needing redirection. Pt has 1:1 sitter at bedside. Pt denies SI/HI/AH/VH . Pt has been irritable and has been agitated last night and was given Haldol/benadryl prn . Patient's current mood symptoms could be 2/2 a combination of his psychiatric diagnosis as well as his hyperammonemia (likely 2/2 being on depakote). Pts Ammonia level is still at 65 and the hospitalist is following him.Ammonia level to be repeated tomorrow AM. Pt has been started on Lithium ,but will need to check Li level to know if it is therapeutic and given the fact that his hyperammonemia is complicating the clinical picture ,it will be impossible to state whether the Lithium is actually helping his mood sx at this time. However will continue to follow pt and will reassess progress.    Diagnosis:  DSM5: Primary Psychiatric Diagnosis: Schizoaffective disorder,bipolar type, multiple episodes ,currently in acute episode   Secondary Psychiatric Diagnosis: Cannabis use disorder Hx of ADHD   Non Psychiatric Diagnosis: Hyperammonemia      Time spent with patient: 30 minutes    ADL's:  Intact  Sleep: Poor  Appetite:  Good  Psychiatric Specialty Exam: Physical Exam  Review of Systems  Constitutional: Negative.   HENT: Negative.   Eyes: Negative.   Respiratory: Negative.   Cardiovascular: Negative.   Gastrointestinal: Negative.   Genitourinary: Negative.   Musculoskeletal: Negative.   Skin: Negative.   Neurological: Negative.   Endo/Heme/Allergies: Negative.   Psychiatric/Behavioral: Positive for substance abuse. Negative for hallucinations. The patient is  nervous/anxious.     Blood pressure 121/67, pulse 105, temperature 98 F (36.7 C), temperature source Oral, resp. rate 18, height _0  (1.778 m), weight 84.823 kg (187 lb), SpO2 100 %.Body mass index is 26.83 kg/(m^2).  General Appearance: Fairly Groomed  Engineer, water::  Fair  Speech; pressured   Volume:  Normal  Mood:  Anxious improving  Affect:  Labile  Thought Process:  Irrelevant and Loose  Orientation:  X 4  Thought Content:  Denies AH/VH ,but continues to be disorganized with some improvement.  Suicidal Thoughts:  No  Homicidal Thoughts:  No  Memory:  Immediate;   Fair Recent;   Fair Remote;   Fair  Judgement:  Poor  Insight:  Lacking  Psychomotor Activity:  Restlessness improving  Concentration:  Poor  Recall:  Poor  Fund of Knowledge:NA  Language: Fair  Akathisia:  No  Handed:    AIMS (if indicated):     Assets:  Housing Social Support  Sleep:  Number of Hours: 3   Musculoskeletal: Strength & Muscle Tone: within normal limits Gait & Station: normal Patient leans: N/A  Current Medications: Current Facility-Administered Medications  Medication Dose Route Frequency Provider Last Rate Last Dose  . acetaminophen (TYLENOL) tablet 650 mg  650 mg Oral Q6H PRN Ursula Alert, MD   650 mg at 09/05/14 1137  . haloperidol (HALDOL) tablet 5 mg  5 mg Oral Q8H PRN Ursula Alert, MD       And  . benztropine (COGENTIN) tablet 0.5 mg  0.5 mg Oral Q8H PRN Jahne Krukowski, MD      . benztropine (COGENTIN) tablet 1 mg  1 mg  Oral BID Shuvon Rankin, NP   1 mg at 09/08/14 1610  . haloperidol lactate (HALDOL) injection 5 mg  5 mg Intramuscular Q8H PRN Ursula Alert, MD       And  . benztropine mesylate (COGENTIN) injection 0.5 mg  0.5 mg Intramuscular Q8H PRN Keevin Panebianco, MD      . hydrOXYzine (ATARAX/VISTARIL) tablet 25 mg  25 mg Oral Q6H PRN Ursula Alert, MD   25 mg at 09/07/14 2246  . lactulose (CHRONULAC) 10 GM/15ML solution 20 g  20 g Oral TID Janece Canterbury, MD   20 g at  09/08/14 1119  . lithium carbonate capsule 300 mg  300 mg Oral BID WC Ursula Alert, MD   300 mg at 09/08/14 0837  . LORazepam (ATIVAN) 2 MG/ML injection           . magnesium hydroxide (MILK OF MAGNESIA) suspension 30 mL  30 mL Oral Daily PRN Shuvon Rankin, NP   30 mL at 09/04/14 0820  . OLANZapine zydis (ZYPREXA) disintegrating tablet 20 mg  20 mg Oral QHS Janett Labella, NP   20 mg at 09/07/14 2109  . traZODone (DESYREL) tablet 150 mg  150 mg Oral QHS Ursula Alert, MD        Lab Results:  Results for orders placed or performed during the hospital encounter of 09/01/14 (from the past 48 hour(s))  Comprehensive metabolic panel     Status: Abnormal   Collection Time: 09/07/14  7:26 PM  Result Value Ref Range   Sodium 139 137 - 147 mEq/L   Potassium 3.9 3.7 - 5.3 mEq/L   Chloride 100 96 - 112 mEq/L   CO2 24 19 - 32 mEq/L   Glucose, Bld 105 (H) 70 - 99 mg/dL   BUN 9 6 - 23 mg/dL   Creatinine, Ser 1.16 0.50 - 1.35 mg/dL   Calcium 9.6 8.4 - 10.5 mg/dL   Total Protein 7.6 6.0 - 8.3 g/dL   Albumin 3.8 3.5 - 5.2 g/dL   AST 26 0 - 37 U/L   ALT 33 0 - 53 U/L   Alkaline Phosphatase 87 39 - 117 U/L   Total Bilirubin 0.2 (L) 0.3 - 1.2 mg/dL   GFR calc non Af Amer 86 (L) >90 mL/min   GFR calc Af Amer >90 >90 mL/min    Comment: (NOTE) The eGFR has been calculated using the CKD EPI equation. This calculation has not been validated in all clinical situations. eGFR's persistently <90 mL/min signify possible Chronic Kidney Disease.    Anion gap 15 5 - 15    Comment: Performed at [facility]  Ammonia     Status: None   Collection Time: 09/07/14  7:26 PM  Result Value Ref Range   Ammonia 60 11 - 60 umol/L    Comment: Performed at [facility]  Ammonia     Status: Abnormal   Collection Time: 09/08/14  6:33 AM  Result Value Ref Range   Ammonia 65 (H) 11 - 60 umol/L    Comment: Performed at [facility]    Physical  Findings: AIMS: Facial and Oral Movements Muscles of Facial Expression: None, normal Lips and Perioral Area: None, normal Jaw: None, normal Tongue: None, normal,Extremity Movements Upper (arms, wrists, hands, fingers): None, normal Lower (legs, knees, ankles, toes): None, normal, Trunk Movements Neck, shoulders, hips: None, normal, Overall Severity Severity of abnormal movements (highest score from questions above): None, normal Incapacitation due to abnormal movements:  None, normal Patient's awareness of abnormal movements (rate only patient's report): No Awareness, Dental Status Current problems with teeth and/or dentures?: No Does patient usually wear dentures?: No  CIWA:    COWS:     Treatment Plan Summary: Daily contact with patient to assess and evaluate symptoms and progress in treatment Medication management  Assessment/Plan:Pt is a 23 year old AAM ,who has a hx of schizoaffective disorder as well as cannabis use do ,who has had multiple admissions to [facility] well as [facility] and who was being followed atb [facility], presented this time with decompensation. Pt however ,over the week end developed hyperammonemia (likely depakote induced) ,and appeared to  confused ,with periods of irritability. However on evaluation today pt is improving and is more alert.  Supportive approach/coping skills Continue the Zyprexa Zydis 20 mg po HS. Will continue prn medications for agitation. However  Ativan/benadryl  Prn was discontinued and cogentin prn started along with Haldol 5 mg prn, since he is confused. Discontinued Depakote 2/2 hyperammonemia and was started on  Lithium 300 mg po bid for mood lability. Continue Lactulose. Will check Ammonia level tomorrow since it is still high. Hospitalist will follow patient. Will continue Trazodone as scheduled ,but will increase dose to 150 mg po qhs. Pt has been sleeping during day time ,that likely could be causing sleep difficulties at  night. Will continue sitter for 1;1 precaution for safety reasons. Pt to participate in treatment milieu.   Medical Decision Making Problem Points:  Established problem, stable/improving (1), New problem, with additional work-up planned (4), Review of last therapy session (1) and Review of psycho-social stressors (1): Improving Data Points:  Review or order clinical lab tests (1) Review or order medicine tests (1) Review of medication regiment & side effects (2) Review of new medications or change in dosage (2)  I certify that inpatient services furnished can reasonably be expected to improve the patient's condition.   Sheilyn Boehlke, MD 09/08/2014, 11:22 AM

## 2014-09-08 NOTE — Tx Team (Signed)
  Interdisciplinary Treatment Plan Update   Date Reviewed:  09/08/2014  Time Reviewed:  8:13 AM  Progress in Treatment:   Attending groups: Sporadically Participating in groups: Sporadically Taking medication as prescribed: Yes  Tolerating medication: Yes Family/Significant other contact made: Yes  Patient understands diagnosis: Yes  Discussing patient identified problems/goals with staff: Yes  See initial care plan Medical problems stabilized or resolved: Yes Denies suicidal/homicidal ideation: Yes  In tx team Patient has not harmed self or others: Yes  For review of initial/current patient goals, please see plan of care.  Estimated Length of Stay:  1-3 days  Reason for Continuation of Hospitalization: Disorganization Mood stabilization Disorganization  New Problems/Goals identified:  N/A  Discharge Plan or Barriers:   return home, follow up outpt  Additional Comments:  "I want to be discharged ". Philip [name] is alert and oriented to person ,place and situation. Pt has a sitter beside him for safety reasons as well as 2/2 periodic disruptive behavior on the unit. Pt also has been having altercations with other pts on the unit.  Pt continues to be anxious ,agitated at times,pacing and has periods of irritability. He also continues to display disorganization.  His Depakote was changed to Lithium yesterday, and an internal specialist saw him due to high ammonia level.  Attendees:  Signature: Ivin BootySarama Eappen, MD 09/08/2014 8:13 AM   Signature: Richelle Itood Jaren Kearn, LCSW 09/08/2014 8:13 AM  Signature: Fransisca KaufmannLaura Davis, NP 09/08/2014 8:13 AM  Signature: 09/08/2014 8:13 AM  Signature: Liborio NixonPatrice White, RN 09/08/2014 8:13 AM  Signature:  09/08/2014 8:13 AM  Signature:   09/08/2014 8:13 AM  Signature:    Signature:    Signature:    Signature:    Signature:    Signature:      Scribe for Treatment Team:   Richelle Itood Kymir Coles, LCSW  09/08/2014 8:13 AM

## 2014-09-08 NOTE — [facility] Group Notes (Signed)
[facility] LCSW Group Therapy  09/08/2014 12:21 PM  Type of Therapy:  Group Therapy  Participation Level:  Did Not Attend  Participation Quality:  NA  Affect:  na  Cognitive:  na  Insight:  None  Engagement in Therapy:  None  Modes of Intervention:  Discussion, Education and Exploration  Summary of Progress/Problems:Emotion Regulation: This group focused on both positive and negative emotion identification and allowed group members to process ways to identify feelings, regulate negative emotions, and find healthy ways to manage internal/external emotions. Group members were asked to reflect on a time when their reaction to an emotion led to a negative outcome and explored how alternative responses using emotion regulation would have benefited them. Group members were also asked to discuss a time when emotion regulation was utilized when a negative emotion was experienced.  Patient invited, did not attend group.     Sallee LangeCunningham, Koury Roddy C 09/08/2014, 12:21 PM

## 2014-09-08 NOTE — Progress Notes (Signed)
Patient ID: Philip [name], male   DOB: 1990-11-04, 23 y.o.   MRN: [id number]   D: Pt was still confused to place and time. Pt called the mht his mother and was talking to her as if she was his mother. When asked about his day pt informed the writer that he's "t[facility] Stated he plans to go "[place]" and play as well.  Writer found it difficult to follow the pt during the majority of the assessment.   A:  Support and encouragement was offered. 15 min checks continued for safety.  R: Pt remains safe.

## 2014-09-09 DIAGNOSIS — F1220 Cannabis dependence, uncomplicated: Secondary | ICD-10-CM

## 2014-09-09 LAB — AMMONIA

## 2014-09-09 MED ORDER — LORAZEPAM 1 MG PO TABS
2.0000 mg | ORAL_TABLET | Freq: Once | ORAL | Status: AC
  Administered 2014-09-09: 2 mg via ORAL
  Filled 2014-09-09: qty 2

## 2014-09-09 MED ORDER — TEMAZEPAM 15 MG PO CAPS: 15.0000 mg | ORAL_CAPSULE | Freq: Every day | ORAL | Status: DC

## 2014-09-09 MED ORDER — AMITRIPTYLINE HCL 25 MG PO TABS
25.0000 mg | ORAL_TABLET | Freq: Every day | ORAL | Status: DC
  Administered 2014-09-09: 25 mg via ORAL
  Filled 2014-09-09 (×2): qty 1

## 2014-09-09 MED ORDER — DIPHENHYDRAMINE HCL 50 MG PO CAPS
50.0000 mg | ORAL_CAPSULE | Freq: Once | ORAL | Status: AC
  Administered 2014-09-09: 50 mg via ORAL
  Filled 2014-09-09: qty 1
  Filled 2014-09-09: qty 2

## 2014-09-09 MED ORDER — HALOPERIDOL 5 MG PO TABS
5.0000 mg | ORAL_TABLET | Freq: Once | ORAL | Status: AC
  Administered 2014-09-09: 5 mg via ORAL
  Filled 2014-09-09 (×2): qty 1

## 2014-09-09 NOTE — [facility] Group Notes (Signed)
[facility]   09/09/2014 12:58 PM  Participation Quality:  Did not attend    [place], Roben B

## 2014-09-09 NOTE — Progress Notes (Signed)
[facility] Post 1:1 Observation Documentation  For the first (8) hours following discontinuation of 1:1 precautions, a progress note entry by nursing staff should be documented at least every 2 hours, reflecting the patient's behavior, condition, mood, and conversation.  Use the progress notes for additional entries.  Time 1:1 discontinued:  0930  Patient's Behavior:  Appropriate  Patient's Condition:  Pt safety maintained at this time  Patient's Conversation:  Tangential  Ligia Duguay L 09/09/2014, 1:14 PM

## 2014-09-09 NOTE — Progress Notes (Signed)
[facility] Post 1:1 Observation Documentation  For the first (8) hours following discontinuation of 1:1 precautions, a progress note entry by nursing staff should be documented at least every 2 hours, reflecting the patient's behavior, condition, mood, and conversation.  Use the progress notes for additional entries.  Time 1:1 discontinued:  0930  Patient's Behavior:  Pt agitated, labile and threatening.  Patient's Condition:  Pt safety maintained. 15 minute checks performed for safety.  Patient's Conversation:  Holiday representativeArgumentative. Loud. Pressured speech. Flight of ideas  [name], [place] Yeats L 09/09/2014, 4:29 PM

## 2014-09-09 NOTE — Progress Notes (Signed)
  D: Pt continues to pace and become increasingly agitated, but went to him room briefly to change clothes. Writer received order for additional prn meds.   A: Pt given prn haldol, ativan, and benadryl. Writer continued to build a rapport with the pt by discussing basketball.  1:1 was continued for safety. Support and encouragement was offered.  R: Pt remains safe.

## 2014-09-09 NOTE — Progress Notes (Signed)
Observation note: Pt observed in his room, resting in bed, with sitter present. Pt do not appear to be in distress. resp are even and unlabored. Pt noted to be easily agitated this am d/t having a sitter following him around. Pt remains on 1:1 observation for safety until d/c'd.

## 2014-09-09 NOTE — Progress Notes (Signed)
[facility] Post 1:1 Observation Documentation  For the first (8) hours following discontinuation of 1:1 precautions, a progress note entry by nursing staff should be documented at least every 2 hours, reflecting the patient's behavior, condition, mood, and conversation.  Use the progress notes for additional entries.  Time 1:1 discontinued:  0930  Patient's Behavior:  Anxious. Easily agitated.   Patient's Condition:  Pt safety maintained. 15 minute checks performed for safety.   Patient's Conversation:  Holiday representativeArgumentative. Tangential speech     [name], Philip [name] 09/09/2014, 10:37 AM

## 2014-09-09 NOTE — Progress Notes (Signed)
[facility] :  Will you be returning to the same living situation after discharge: Yes,  home At discharge, do you have transportation home?:Yes,  bus pass Do you have the ability to pay for your medications:Yes,  mental health  Release of information consent forms completed and in the chart;  Patient's signature needed at discharge.  Patient to Follow up at: Follow-up Information    Follow up with [facility].   Why:  Go to the walk-in clinic M-F between 8 and 10 AM for your hospital follow up appointment   Contact information:   [address]  [place]  [phone]      Patient denies SI/HI:   Yes,  Philip [name]    Safety Planning and Suicide Prevention discussed:  Yes,  yes  Philip Philip [name], Philip [name] 09/09/2014, 2:28 PM

## 2014-09-09 NOTE — Progress Notes (Signed)
[facility] MD Progress Note  09/09/2014 12:43 PM Philip [name]  MRN:  [id number] Subjective:Pt states,'I want to start school ,I do not want to be locked up ".  Objective:   Pt seen and chart reviewed.Pt is more calm today , is more alert and oriented. Pt continues to be tangential with periodic irritability . Pt was just started on Lithium and will need more time on it.  Patient's current mood symptoms could be 2/2 a combination of his psychiatric diagnosis as well as his hyperammonemia (likely 2/2 being on depakote). Pts Ammonia level needs to be rechecked today.Pt refused labs this AM and hence will need to be checked this PM. However will continue to follow pt and will reassess progress.  Per staff pt has been pacing ,demanding ,has periods of agitation needing redirection.   Diagnosis:  DSM5: Primary Psychiatric Diagnosis: Schizoaffective disorder,bipolar type, multiple episodes ,currently in acute episode   Secondary Psychiatric Diagnosis: Cannabis use disorder Hx of ADHD   Non Psychiatric Diagnosis: Hyperammonemia      Time spent with patient: 30 minutes    ADL's:  Intact  Sleep: Poor  Appetite:  Good  Psychiatric Specialty Exam: Physical Exam  Review of Systems  Constitutional: Negative.   HENT: Negative.   Eyes: Negative.   Respiratory: Negative.   Cardiovascular: Negative.   Gastrointestinal: Negative.   Genitourinary: Negative.   Musculoskeletal: Negative.   Skin: Negative.   Neurological: Negative.   Endo/Heme/Allergies: Negative.   Psychiatric/Behavioral: Positive for substance abuse. Negative for hallucinations. The patient is nervous/anxious.     Blood pressure 145/91, pulse 92, temperature 98 F (36.7 C), temperature source Oral, resp. rate 16, height $RemoveBe'5\' 10"'ubaZlgint$  (1.778 m), weight 84.823 kg (187 lb), SpO2 100 %.Body mass index is 26.83 kg/(m^2).  General Appearance: Fairly Groomed  Engineer, water::  Fair  Speech; pressured ,improving  Volume:  Normal   Mood:  Anxious improving  Affect:  Labile  Thought Process:  Irrelevant and Looseimproving  Orientation:  X 4  Thought Content:  Denies AH/VH ,but continues to be disorganized with some improvement.  Suicidal Thoughts:  No  Homicidal Thoughts:  No  Memory:  Immediate;   Fair Recent;   Fair Remote;   Fair  Judgement:  Poor  Insight:  Lacking  Psychomotor Activity:  Restlessness improving  Concentration:  Poor  Recall:  Poor  Fund of Knowledge:NA  Language: Fair  Akathisia:  No  Handed:    AIMS (if indicated):     Assets:  Housing Social Support  Sleep:  Number of Hours: 0.75   Musculoskeletal: Strength & Muscle Tone: within normal limits Gait & Station: normal Patient leans: N/A  Current Medications: Current Facility-Administered Medications  Medication Dose Route Frequency Provider Last Rate Last Dose  . acetaminophen (TYLENOL) tablet 650 mg  650 mg Oral Q6H PRN Ursula Alert, MD   650 mg at 09/05/14 1137  . amitriptyline (ELAVIL) tablet 25 mg  25 mg Oral QHS Leolia Vinzant, MD      . haloperidol (HALDOL) tablet 5 mg  5 mg Oral Q8H PRN Ursula Alert, MD   5 mg at 09/09/14 0150   And  . benztropine (COGENTIN) tablet 0.5 mg  0.5 mg Oral Q8H PRN Ursula Alert, MD      . benztropine (COGENTIN) tablet 1 mg  1 mg Oral BID Shuvon Rankin, NP   1 mg at 09/09/14 0755  . haloperidol lactate (HALDOL) injection 5 mg  5 mg Intramuscular Q8H PRN Ursula Alert, MD  And  . benztropine mesylate (COGENTIN) injection 0.5 mg  0.5 mg Intramuscular Q8H PRN Ursula Alert, MD      . hydrOXYzine (ATARAX/VISTARIL) tablet 25 mg  25 mg Oral Q6H PRN Ursula Alert, MD   25 mg at 09/09/14 0150  . lactulose (CHRONULAC) 10 GM/15ML solution 20 g  20 g Oral TID Janece Canterbury, MD   20 g at 09/09/14 1152  . lithium carbonate capsule 300 mg  300 mg Oral BID WC Ursula Alert, MD   300 mg at 09/09/14 0755  . magnesium hydroxide (MILK OF MAGNESIA) suspension 30 mL  30 mL Oral Daily PRN Shuvon  Rankin, NP   30 mL at 09/04/14 0820  . OLANZapine zydis (ZYPREXA) disintegrating tablet 20 mg  20 mg Oral QHS Sheila May Agustin, NP   20 mg at 09/08/14 2137    Lab Results:  Results for orders placed or performed during the hospital encounter of 09/01/14 (from the past 48 hour(s))  Comprehensive metabolic panel     Status: Abnormal   Collection Time: 09/07/14  7:26 PM  Result Value Ref Range   Sodium 139 137 - 147 mEq/L   Potassium 3.9 3.7 - 5.3 mEq/L   Chloride 100 96 - 112 mEq/L   CO2 24 19 - 32 mEq/L   Glucose, Bld 105 (H) 70 - 99 mg/dL   BUN 9 6 - 23 mg/dL   Creatinine, Ser 1.16 0.50 - 1.35 mg/dL   Calcium 9.6 8.4 - 10.5 mg/dL   Total Protein 7.6 6.0 - 8.3 g/dL   Albumin 3.8 3.5 - 5.2 g/dL   AST 26 0 - 37 U/L   ALT 33 0 - 53 U/L   Alkaline Phosphatase 87 39 - 117 U/L   Total Bilirubin 0.2 (L) 0.3 - 1.2 mg/dL   GFR calc non Af Amer 86 (L) >90 mL/min   GFR calc Af Amer >90 >90 mL/min    Comment: (NOTE) The eGFR has been calculated using the CKD EPI equation. This calculation has not been validated in all clinical situations. eGFR's persistently <90 mL/min signify possible Chronic Kidney Disease.    Anion gap 15 5 - 15    Comment: Performed at [facility]  Ammonia     Status: None   Collection Time: 09/07/14  7:26 PM  Result Value Ref Range   Ammonia 60 11 - 60 umol/L    Comment: Performed at [facility]  Ammonia     Status: Abnormal   Collection Time: 09/08/14  6:33 AM  Result Value Ref Range   Ammonia 65 (H) 11 - 60 umol/L    Comment: Performed at [facility]    Physical Findings: AIMS: Facial and Oral Movements Muscles of Facial Expression: None, normal Lips and Perioral Area: None, normal Jaw: None, normal Tongue: None, normal,Extremity Movements Upper (arms, wrists, hands, fingers): None, normal Lower (legs, knees, ankles, toes): None, normal, Trunk Movements Neck, shoulders, hips: None, normal,  Overall Severity Severity of abnormal movements (highest score from questions above): None, normal Incapacitation due to abnormal movements: None, normal Patient's awareness of abnormal movements (rate only patient's report): No Awareness, Dental Status Current problems with teeth and/or dentures?: No Does patient usually wear dentures?: No  CIWA:    COWS:     Treatment Plan Summary: Daily contact with patient to assess and evaluate symptoms and progress in treatment Medication management  Assessment/Plan:Pt is a 23 year old AAM ,who has a hx of schizoaffective  disorder as well as cannabis use do ,who has had multiple admissions to [facility] as well as prison [facility] and who was being followed at[facility], presented this time with decompensation. Pt however ,over the week end developed hyperammonemia (likely depakote induced) ,and appeared to be confused ,with periods of irritability. However on evaluation today pt is improving.  Supportive approach/coping skills Continue the Zyprexa Zydis 20 mg po HS. Haldol prn for agitation. Discontinued Depakote 2/2 hyperammonemia and was started on  Lithium 300 mg po bid for mood lability. Continue Lactulose.  Hospitalist will follow patient and make further recommendations. Will discontinue Trazodone since he got only 1 hr sleep last night. Will start Elavil 25 mg po qhs.  Pt to participate in treatment milieu.   Medical Decision Making Problem Points:  Established problem, stable/improving (1), New problem, with additional work-up planned (4), Review of last therapy session (1) and Review of psycho-social stressors (1): Improving Data Points:  Review or order clinical lab tests (1) Review or order medicine tests (1) Review of medication regiment & side effects (2) Review of new medications or change in dosage (2)  I certify that inpatient services furnished can reasonably be expected to improve the patient's condition.   Lonya Johannesen,  MD 09/09/2014, 12:43 PM

## 2014-09-09 NOTE — Progress Notes (Signed)
Pending ammonia level this AM, pt has refused blood work so will attempt this afternoon. Pt is clinically stable this AM, VSS, less confusion. Continue Lactulose 20 gm TID PO upon discharge. If ammonia level still elevated and pt remains hypertensive with SBP > 140/90, consider adding low dose spironolactone 25 mg po QD upon discharge.  Thank you very much and please call with additional questions, will sign off for now.  Debbora PrestoMAGICK-Brandun Pinn, MD  [facility] Pager [phone] Cell [phone]  If 7PM-7AM, please contact night-coverage [email or web address] Password [email or web address]

## 2014-09-09 NOTE — [facility] Group Notes (Signed)
[facility] LCSW Group Therapy  09/09/2014 1:28 PM  Type of Therapy:  Group Therapy  Participation Level: Invited. Did Not Attend  Summary of Progress/Problems: [facility]) speaker came to talk about his personal journey with substance abuse and mental illness. Group members were challenged to process ways by which to relate to the speaker. MHA speaker provided handouts and educational information pertaining to groups and services offered by the [facility].   Hyatt,Candace 09/09/2014, 1:28 PM

## 2014-09-09 NOTE — Progress Notes (Signed)
D: Pt informs the Clinical research associatewriter that he plans to attend barber school and take all the "little homies" with him.  Stated he won't give them "the white stuff, just a little smoke". Pt continues to pace the hall, but required less redirection this evening than previous evening. Pt asked writer about the medication process after discharge. Informed pt that if [facility]'s [facility], that they would be responsible for admin his meds.   A:  Support and encouragement was offered. 15 min checks continued for safety.  R: Pt remains safe.

## 2014-09-09 NOTE — Progress Notes (Signed)
While Clinical research associatewriter was in med room and MHT in dayroom, pt ran behind the nurses station and pressed the Cirt button.

## 2014-09-09 NOTE — Progress Notes (Signed)
[facility] Post 1:1 Observation Documentation  For the first (8) hours following discontinuation of 1:1 precautions, a progress note entry by nursing staff should be documented at least every 2 hours, reflecting the patient's behavior, condition, mood, and conversation.  Use the progress notes for additional entries.  Time 1:1 discontinued:  0930  Patient's Behavior:  Appropriate. Easily agitated   Patient's Condition:  Pt safety maintained   Patient's Conversation:  Tangential speech   Jeanise Durfey L 09/09/2014, 2:21 PM

## 2014-09-09 NOTE — Progress Notes (Signed)
[facility] Post 1:1 Observation Documentation  For the first (8) hours following discontinuation of 1:1 precautions, a progress note entry by nursing staff should be documented at least every 2 hours, reflecting the patient's behavior, condition, mood, and conversation.  Use the progress notes for additional entries.  Time 1:1 discontinued:  0930   Patient's Behavior:  Appropriate   Patient's Condition:  Pt safety maintained. 15 minute checks performed for safety  Patient's Conversation:  Logical   Philip [name] L 09/09/2014, 5:25 PM

## 2014-09-09 NOTE — [facility] Group Notes (Signed)
Adult Psychoeducational Group Note  Date:  09/09/2014 Time:  8:44 PM  Group Topic/Focus:  Wrap-Up Group:   The focus of this group is to help patients review their daily goal of treatment and discuss progress on daily workbooks.  Participation Level:  Active  Participation Quality:  Intrusive  Affect:  Excited  Cognitive:  Confused  Insight: Lacking  Engagement in Group:  Lacking  Modes of Intervention:  Discussion  Additional Comments: The patient talked confused and off the topic.  Octavio Mannshigpen, Ladarien Beeks Lee 09/09/2014, 8:44 PM

## 2014-09-10 MED ORDER — LITHIUM CARBONATE 300 MG PO CAPS
300.0000 mg | ORAL_CAPSULE | Freq: Two times a day (BID) | ORAL | Status: DC
Start: 2014-09-10 — End: 2015-01-19

## 2014-09-10 MED ORDER — AMITRIPTYLINE HCL 25 MG PO TABS: 25.0000 mg | ORAL_TABLET | Freq: Every day | ORAL | Status: DC

## 2014-09-10 MED ORDER — HYDROXYZINE HCL 25 MG PO TABS: 25.0000 mg | ORAL_TABLET | Freq: Four times a day (QID) | ORAL | Status: DC | PRN

## 2014-09-10 MED ORDER — BENZTROPINE MESYLATE 1 MG PO TABS: 1.0000 mg | ORAL_TABLET | Freq: Two times a day (BID) | ORAL | Status: DC

## 2014-09-10 MED ORDER — LACTULOSE 10 GM/15ML PO SOLN: 20.0000 g | Freq: Three times a day (TID) | ORAL | Status: DC

## 2014-09-10 MED ORDER — OLANZAPINE 20 MG PO TBDP: 20.0000 mg | ORAL_TABLET | Freq: Every day | ORAL | Status: DC

## 2014-09-10 MED ORDER — BENZTROPINE MESYLATE 1 MG PO TABS
1.0000 mg | ORAL_TABLET | Freq: Two times a day (BID) | ORAL | Status: DC
  Filled 2014-09-10 (×3): qty 28

## 2014-09-10 NOTE — [facility] Suicide Risk Assessment (Signed)
   Demographic Factors:  Male and Unemployed  Total Time spent with patient: 45 minutes  Psychiatric Specialty Exam: Physical Exam  ROS  Blood pressure 136/86, pulse 101, temperature 98.6 F (37 C), temperature source Oral, resp. rate 20, height 5\' 10"  (1.778 m), weight 84.823 kg (187 lb), SpO2 100 %.Body mass index is 26.83 kg/(m^2).  General Appearance: Casual  Eye Contact::  Good  Speech:  Clear and Coherent  Volume:  Normal  Mood:  Euthymic  Affect:  Congruent  Thought Process:  Coherent  Orientation:  Full (Time, Place, and Person)  Thought Content:  WDL  Suicidal Thoughts:  No  Homicidal Thoughts:  No  Memory:  Immediate;   Fair Recent;   Fair Remote;   Fair  Judgement:  Fair  Insight:  Fair  Psychomotor Activity:  Normal  Concentration:  Good  Recall:  [employer]Fair  Fund of Knowledge:Fair  Language: Good  Akathisia:  No  Handed:  Right  AIMS (if indicated):   0  Assets:  Communication Skills Desire for Improvement  Sleep:  Number of Hours: 2.5    Musculoskeletal: Strength & Muscle Tone: within normal limits Gait & Station: normal Patient leans: N/A   Mental Status Per Nursing Assessment::   On Admission:  Thoughts of violence towards others  Current Mental Status by Physician: Pt is alert,oreinted,reports mood as good ,denies SI/HI/AH/VH.  Loss Factors: NA  Historical Factors: Impulsivity  Risk Reduction Factors:   Positive social support  Continued Clinical Symptoms:  Alcohol/Substance Abuse/Dependencies Previous Psychiatric Diagnoses and Treatments Medical Diagnoses and Treatments/Surgeries  Cognitive Features That Contribute To Risk:  Patient is alert ,oriented ,concentration and memory grossly intact    Suicide Risk:  Minimal: No identifiable suicidal ideation.   Discharge Diagnoses:  DSM5: Primary Psychiatric Diagnosis: Schizoaffective disorder,bipolar type, multiple episodes ,currently in (acute episode-resolved)   Secondary  Psychiatric Diagnosis: Cannabis use disorder Hx of ADHD   Non Psychiatric Diagnosis: Hyperammonemia (likely secondary to Depakote )   Past Medical History  Diagnosis Date  . Bipolar affective disorder   . Schizophrenia   . ADHD (attention deficit hyperactivity disorder)     Plan Of Care/Follow-up recommendations:  Activity:  no restrictions Diet:  regular  Is patient on multiple antipsychotic therapies at discharge:  No   Has Patient had three or more failed trials of antipsychotic monotherapy by history:  No  Recommended Plan for Multiple Antipsychotic Therapies: NA    Chalyn Amescua MD 09/10/2014, 9:30 AM

## 2014-09-10 NOTE — Progress Notes (Signed)
[facility] :  Will you be returning to the same living situation after discharge: Yes,  home At discharge, do you have transportation home?:Yes,  uncle or bus pass Do you have the ability to pay for your medications:Yes  Mental Health  Release of information consent forms completed and in the chart;  Patient's signature needed at discharge.  Patient to Follow up at: Follow-up Information    Follow up with [facility].   Why:  Go to the walk-in clinic M-F between 8 and 10 AM for your hospital follow up appointment   Contact information:   [address]  [place]  [[phone]      Patient denies SI/HI:   Yes,  yes    Safety Planning and Suicide Prevention discussed:  Yes,  yes  Ida Rogueorth, Lamar B 09/10/2014, 9:50 AM
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
## 2014-09-10 NOTE — Progress Notes (Signed)
Discharge Note: Discharge instructions/prescriptions/medication samples given to patient. Patient verbalized understanding of discharge instructions and prescriptions. Returned belongings to patient. Denies SI/HI/AVH. Patient d/c without incident to the front lobby and transported by [employer] [place].

## 2014-09-10 NOTE — Tx Team (Signed)
  Interdisciplinary Treatment Plan Update   Date Reviewed:  09/10/2014  Time Reviewed:  9:45 AM  Progress in Treatment:   Attending groups: Yes Participating in groups: Yes Taking medication as prescribed: Yes  Tolerating medication: Yes Family/Significant other contact made: Yes  Patient understands diagnosis: Yes  Discussing patient identified problems/goals with staff: Yes  See initial care plan Medical problems stabilized or resolved: Yes Denies suicidal/homicidal ideation: Yes  In tx team Patient has not harmed self or others: Yes  For review of initial/current patient goals, please see plan of care.  Estimated Length of Stay:  D/C today  Reason for Continuation of Hospitalization:   New Problems/Goals identified:  N/A  Discharge Plan or Barriers:   return home, follow up outpt  Additional Comments:  Attendees:  Signature: Ivin BootySarama Eappen, MD 09/10/2014 9:45 AM   Signature: Richelle Itood Vonzell Lindblad, LCSW 09/10/2014 9:45 AM  Signature:  09/10/2014 9:45 AM  Signature:  09/10/2014 9:45 AM  Signature: Orlie PollenMarion Friedman, RN 09/10/2014 9:45 AM  Signature:  09/10/2014 9:45 AM  Signature:   09/10/2014 9:45 AM  Signature:    Signature:    Signature:    Signature:    Signature:    Signature:      Scribe for Treatment Team:   Richelle Itood Samai Corea, LCSW  09/10/2014 9:45 AM

## 2014-09-14 NOTE — Progress Notes (Signed)
Patient Discharge Instructions:  After Visit Summary (AVS):   Faxed to:  09/14/14 Discharge Summary Note:   Faxed to:  09/14/14 Psychiatric Admission Assessment Note:   Faxed to:  09/14/14 Suicide Risk Assessment - Discharge Assessment:   Faxed to:  09/14/14 Faxed/Sent to the Next Level Care provider:  09/14/14 Next Level Care Provider Has Access to the EMR, 09/14/14  [facility] @ [phone] Records provided to [facility] via CHL/Epic access  Jerelene ReddenSheena E Oakesdale, 09/14/2014, 3:51 PM

## 2014-09-15 ENCOUNTER — Encounter (HOSPITAL_COMMUNITY): Payer: Self-pay

## 2014-09-15 ENCOUNTER — Emergency Department (HOSPITAL_COMMUNITY)
Admission: EM | Admit: 2014-09-15 | Discharge: 2014-09-16 | Disposition: A | Discharge status: Home / Self Care | Payer: Self-pay | Attending: Emergency Medicine | Admitting: Emergency Medicine

## 2014-09-15 DIAGNOSIS — Z79899 Other long term (current) drug therapy: Secondary | ICD-10-CM | POA: Insufficient documentation

## 2014-09-15 DIAGNOSIS — F1994 Other psychoactive substance use, unspecified with psychoactive substance-induced mood disorder: Secondary | ICD-10-CM | POA: Diagnosis present

## 2014-09-15 DIAGNOSIS — R451 Restlessness and agitation: Secondary | ICD-10-CM

## 2014-09-15 DIAGNOSIS — F1210 Cannabis abuse, uncomplicated: Secondary | ICD-10-CM | POA: Insufficient documentation

## 2014-09-15 DIAGNOSIS — F250 Schizoaffective disorder, bipolar type: Secondary | ICD-10-CM | POA: Diagnosis present

## 2014-09-15 DIAGNOSIS — F1220 Cannabis dependence, uncomplicated: Secondary | ICD-10-CM | POA: Diagnosis present

## 2014-09-15 DIAGNOSIS — F329 Major depressive disorder, single episode, unspecified: Secondary | ICD-10-CM | POA: Insufficient documentation

## 2014-09-15 DIAGNOSIS — F312 Bipolar disorder, current episode manic severe with psychotic features: Secondary | ICD-10-CM | POA: Diagnosis present

## 2014-09-15 DIAGNOSIS — Z720 Tobacco use: Secondary | ICD-10-CM | POA: Insufficient documentation

## 2014-09-15 DIAGNOSIS — F1910 Other psychoactive substance abuse, uncomplicated: Secondary | ICD-10-CM | POA: Diagnosis present

## 2014-09-15 LAB — COMPREHENSIVE METABOLIC PANEL
ALK PHOS: 86 U/L (ref 39–117)
ALT: 59 U/L — AB (ref 0–53)
AST: 98 U/L — AB (ref 0–37)
Albumin: 4.2 g/dL (ref 3.5–5.2)
Anion gap: 14 (ref 5–15)
BUN: 8 mg/dL (ref 6–23)
CHLORIDE: 100 meq/L (ref 96–112)
CO2: 25 mEq/L (ref 19–32)
CREATININE: 1.04 mg/dL (ref 0.50–1.35)
Calcium: 9.6 mg/dL (ref 8.4–10.5)
GFR calc Af Amer: >90 mL/min (ref >90)
Glucose, Bld: 101 mg/dL — ABNORMAL HIGH (ref 70–99)
Potassium: 3.9 mEq/L (ref 3.7–5.3)
SODIUM: 139 meq/L (ref 137–147)
Total Bilirubin: 0.8 mg/dL (ref 0.3–1.2)
Total Protein: 8 g/dL (ref 6.0–8.3)

## 2014-09-15 LAB — RAPID URINE DRUG SCREEN, HOSP PERFORMED
AMPHETAMINES: NOT DETECTED
BARBITURATES: NOT DETECTED
BENZODIAZEPINES: NOT DETECTED
COCAINE: NOT DETECTED
Opiates: NOT DETECTED
TETRAHYDROCANNABINOL: POSITIVE — AB

## 2014-09-15 LAB — ETHANOL: Alcohol, Ethyl (B): <11 mg/dL (ref 0–11)

## 2014-09-15 LAB — CBC
HCT: 38.8 % — ABNORMAL LOW (ref 39.0–52.0)
Hemoglobin: 13 g/dL (ref 13.0–17.0)
MCH: 29.3 pg (ref 26.0–34.0)
MCHC: 33.5 g/dL (ref 30.0–36.0)
MCV: 87.6 fL (ref 78.0–100.0)
Platelets: 359 10*3/uL (ref 150–400)
RBC: 4.43 MIL/uL (ref 4.22–5.81)
RDW: 12.2 % (ref 11.5–15.5)
WBC: 10.8 10*3/uL — ABNORMAL HIGH (ref 4.0–10.5)

## 2014-09-15 LAB — SALICYLATE LEVEL: Salicylate Lvl: <2 mg/dL — ABNORMAL LOW (ref 2.8–20.0)

## 2014-09-15 LAB — ACETAMINOPHEN LEVEL: Acetaminophen (Tylenol), Serum: <15 ug/mL (ref 10–30)

## 2014-09-15 MED ORDER — ZIPRASIDONE MESYLATE 20 MG IM SOLR
20.0000 mg | Freq: Once | INTRAMUSCULAR | Status: AC
  Administered 2014-09-15: 20 mg via INTRAMUSCULAR
  Filled 2014-09-15: qty 20

## 2014-09-15 MED ORDER — HYDROXYZINE HCL 25 MG PO TABS
25.0000 mg | ORAL_TABLET | Freq: Four times a day (QID) | ORAL | Status: DC | PRN
  Administered 2014-09-16: 25 mg via ORAL
  Filled 2014-09-15: qty 1

## 2014-09-15 MED ORDER — LORAZEPAM 2 MG/ML IJ SOLN
2.0000 mg | Freq: Once | INTRAMUSCULAR | Status: AC
  Administered 2014-09-15: 2 mg via INTRAMUSCULAR
  Filled 2014-09-15: qty 1

## 2014-09-15 MED ORDER — BENZTROPINE MESYLATE 1 MG PO TABS
1.0000 mg | ORAL_TABLET | Freq: Every day | ORAL | Status: DC
  Administered 2014-09-16: 1 mg via ORAL
  Filled 2014-09-15: qty 1

## 2014-09-15 MED ORDER — TRAZODONE HCL 50 MG PO TABS: 50.0000 mg | ORAL_TABLET | Freq: Every evening | ORAL | Status: DC | PRN

## 2014-09-15 MED ORDER — NICOTINE 21 MG/24HR TD PT24
21.0000 mg | MEDICATED_PATCH | Freq: Every day | TRANSDERMAL | Status: DC
  Administered 2014-09-15: 21 mg via TRANSDERMAL
  Filled 2014-09-15: qty 1

## 2014-09-15 MED ORDER — ONDANSETRON HCL 4 MG PO TABS: 4.0000 mg | ORAL_TABLET | Freq: Three times a day (TID) | ORAL | Status: DC | PRN

## 2014-09-15 MED ORDER — STERILE WATER FOR INJECTION IJ SOLN
INTRAMUSCULAR | Status: AC
  Administered 2014-09-15: 21:00:00
  Filled 2014-09-15: qty 10

## 2014-09-15 MED ORDER — IBUPROFEN 200 MG PO TABS
600.0000 mg | ORAL_TABLET | Freq: Three times a day (TID) | ORAL | Status: DC | PRN
  Administered 2014-09-16: 600 mg via ORAL
  Filled 2014-09-15: qty 3

## 2014-09-15 MED ORDER — DIPHENHYDRAMINE HCL 50 MG/ML IJ SOLN
50.0000 mg | Freq: Once | INTRAMUSCULAR | Status: AC
  Administered 2014-09-15: 50 mg via INTRAMUSCULAR
  Filled 2014-09-15: qty 1

## 2014-09-15 MED ORDER — OLANZAPINE 10 MG PO TBDP: 20.0000 mg | ORAL_TABLET | Freq: Every day | ORAL | Status: DC

## 2014-09-15 MED ORDER — ZOLPIDEM TARTRATE 5 MG PO TABS: 5.0000 mg | ORAL_TABLET | Freq: Every evening | ORAL | Status: DC | PRN

## 2014-09-15 MED ORDER — ALUM & MAG HYDROXIDE-SIMETH 200-200-20 MG/5ML PO SUSP: 30.0000 mL | ORAL | Status: DC | PRN

## 2014-09-15 MED ORDER — LORAZEPAM 1 MG PO TABS
1.0000 mg | ORAL_TABLET | Freq: Three times a day (TID) | ORAL | Status: DC | PRN
  Administered 2014-09-15 – 2014-09-16 (×2): 1 mg via ORAL
  Filled 2014-09-15 (×2): qty 1

## 2014-09-15 MED ORDER — LITHIUM CARBONATE 300 MG PO CAPS
300.0000 mg | ORAL_CAPSULE | Freq: Two times a day (BID) | ORAL | Status: DC
  Administered 2014-09-16: 300 mg via ORAL
  Filled 2014-09-15: qty 1

## 2014-09-15 NOTE — ED Notes (Signed)
Report given to SAPU.  Hold 10 min prior to transport.
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
## 2014-09-15 NOTE — ED Notes (Signed)
[facility] Expected date: 09/15/14 Expected time:  Means of arrival:  Comments: Hold for [facility] transfer

## 2014-09-15 NOTE — ED Notes (Addendum)
Per pt, he is hearing and seeing things.  He sees basketballs in his head.  He is hearing voices which tell him to hurt others.  "Like the police"  Snap their necks.  No one specific.  Pt calm at this time.  [employer] remaining at bedside for safety.  Pt denies SI to self.
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
## 2014-09-15 NOTE — ED Notes (Addendum)
Pt. Has a red jacket, jeans, shirt, under clothing, socks red black and silver shoes, phone charger, deodorant, basketball metal key chain.
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
## 2014-09-15 NOTE — ED Notes (Signed)
Pt came to the unit tangential and restless. Pt was given PRN ativan. Pt required redirection as he engaged with other pt's on the unit. Safety maintained in the SAPPU.

## 2014-09-15 NOTE — ED Notes (Signed)
Patient pacing hall with patient from room 36. Threatening staff.

## 2014-09-16 ENCOUNTER — Encounter (HOSPITAL_COMMUNITY): Payer: Self-pay | Admitting: Registered Nurse

## 2014-09-16 DIAGNOSIS — F1910 Other psychoactive substance abuse, uncomplicated: Secondary | ICD-10-CM | POA: Diagnosis present

## 2014-09-16 DIAGNOSIS — F259 Schizoaffective disorder, unspecified: Secondary | ICD-10-CM

## 2014-09-16 DIAGNOSIS — R451 Restlessness and agitation: Secondary | ICD-10-CM | POA: Insufficient documentation

## 2014-09-16 DIAGNOSIS — F1994 Other psychoactive substance use, unspecified with psychoactive substance-induced mood disorder: Secondary | ICD-10-CM | POA: Diagnosis present

## 2014-09-16 LAB — LITHIUM LEVEL: Lithium Lvl: 0.33 mEq/L — ABNORMAL LOW (ref 0.80–1.40)

## 2014-09-16 MED ORDER — ZIPRASIDONE MESYLATE 20 MG IM SOLR
20.0000 mg | Freq: Once | INTRAMUSCULAR | Status: DC
  Filled 2014-09-16: qty 20

## 2014-09-16 MED ORDER — DIPHENHYDRAMINE HCL 50 MG/ML IJ SOLN
50.0000 mg | Freq: Once | INTRAMUSCULAR | Status: DC
  Filled 2014-09-16: qty 1

## 2014-09-16 NOTE — BH Assessment (Signed)
Inpt recommended, no [facility] beds. Sent referrals to:  [facility]  Chirag Krueger, [facility] Triage Specialist 09/16/2014 5:48 AM

## 2014-09-16 NOTE — [facility] Suicide Risk Assessment (Cosign Needed)
Suicide Risk Assessment  Discharge Assessment     Demographic Factors:  Male  Total Time spent with patient: 30 minutes  Psychiatric Specialty Exam:     Blood pressure 123/73, pulse 93, temperature 98.2 F (36.8 C), temperature source Oral, resp. rate 20, SpO2 100 %.There is no weight on file to calculate BMI.  General Appearance: Casual  Eye Contact:: Good  Speech: Clear and Coherent and Normal Rate  Volume: Increased  Mood: Anxious  Affect: Congruent  Thought Process: Circumstantial  Orientation: Full (Time, Place, and Person)  Thought Content: Rumination  Suicidal Thoughts: No  Homicidal Thoughts: No  Memory: Immediate; Good Recent; Good Remote; Good  Judgement: Fair  Insight: Fair  Psychomotor Activity: Normal  Concentration: Fair  Recall: Good  Fund of Knowledge:Good  Language: Good  Akathisia: No  Handed: Right  AIMS (if indicated):    Assets: Communication Skills Desire for Improvement Social Support  Sleep:     Musculoskeletal: Strength & Muscle Tone: within normal limits Gait & Station: normal Patient leans: N/A     Mental Status Per Nursing Assessment::   On Admission:     Current Mental Status by Physician: Denies suicidal/homicidal ideation, psychosis, and paranoia  Loss Factors: NA  Historical Factors: NA  Risk Reduction Factors:   Sense of responsibility to family and Positive social support  Continued Clinical Symptoms:  Previous Psychiatric Diagnoses and Treatments  Cognitive Features That Contribute To Risk:  Closed-mindedness    Suicide Risk:  Minimal: No identifiable suicidal ideation.  Patients presenting with no risk factors but with morbid ruminations; may be classified as minimal risk based on the severity of the depressive symptoms  Discharge Diagnoses:  AXIS I: Schizoaffective Disorder, Substance abuse, and Substance induced mood disorder AXIS II: Deferred AXIS  III:  Past Medical History  Diagnosis Date  . Bipolar affective disorder   . Schizophrenia   . ADHD (attention deficit hyperactivity disorder)    AXIS IV: other psychosocial or environmental problems and problems related to social environment AXIS V: 61-70 mild symptoms  Plan Of Care/Follow-up recommendations:  Activity:  as tolerated Diet:  as tolerated Other:  Follow up with [facility]  Is patient on multiple antipsychotic therapies at discharge:  No   Has Patient had three or more failed trials of antipsychotic monotherapy by history:  No  Recommended Plan for Multiple Antipsychotic Therapies: NA    Rankin, Shuvon, FNP-BC 09/16/2014, 10:39 AM
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
## 2014-09-16 NOTE — Discharge Instructions (Signed)
Chemical Dependency Chemical dependency is an addiction to drugs or alcohol. It is characterized by the repeated behavior of seeking out and using drugs and alcohol despite harmful consequences to the health and safety of ones self and others.  RISK FACTORS There are certain situations or behaviors that increase a person's risk for chemical dependency. These include:  A family history of chemical dependency.  A history of mental health issues, including depression and anxiety.  A home environment where drugs and alcohol are easily available to you.  Drug or alcohol use at a young age. SYMPTOMS  The following symptoms can indicate chemical dependency:  Inability to limit the use of drugs or alcohol.  Nausea, sweating, shakiness, and anxiety that occurs when alcohol or drugs are not being used.  An increase in amount of drugs or alcohol that is necessary to get drunk or high. People who experience these symptoms can assess their use of drugs and alcohol by asking themselves the following questions:  Have you been told by friends or family that they are worried about your use of alcohol or drugs?  Do friends and family ever tell you about things you did while drinking alcohol or using drugs that you do not remember?  Do you lie about using alcohol or drugs or about the amounts you use?  Do you have difficulty completing daily tasks unless you use alcohol or drugs?  Is the level of your work or school performance lower because of your drug or alcohol use?  Do you get sick from using drugs or alcohol but keep using anyway?  Do you feel uncomfortable in social situations unless you use alcohol or drugs?  Do you use drugs or alcohol to help forget problems? An answer of yes to any of these questions may indicate chemical dependency. Professional evaluation is suggested. Document Released: 10/10/2001 Document Revised: 01/08/2012 Document Reviewed: 12/22/2010 [facility] Patient  Information 2015 [place], [place]. This information is not intended to replace advice given to you by your health care provider. Make sure you discuss any questions you have with your health care provider.  Drug Abuse and Addiction in Sports There are many types of drugs that one may become addicted to including illegal drugs (marijuana, cocaine, amphetamines, hallucinogens, and narcotics), prescription drugs (hydrocodone, codeine, and alprazolam), and other chemicals such as alcohol or nicotine. Two types of addiction exist: physical and emotional. Physical addiction usually occurs after prolonged use of a drug. However, some drugs may only take a couple uses before addiction can occur. Physical addiction is marked by withdrawal symptoms in which the person experiences negative symptoms such as sweat, anxiety, tremors, hallucinations, or cravings in the absence of using the drug. Emotional dependence is the psychological desire for the "high" that the drugs produce when taken. SYMPTOMS   Inattentiveness.  Negligence.  Forgetfulness.  Insomnia.  Mood swings. RISK INCREASES WITH:   Family history of addiction.  Personal history of addictive personality. Studies have shown that risk takers, which many athletes are, have a higher risk of addiction. PREVENTION The only adequate prevention of drug abuse is abstinence from drugs. TREATMENT  The first step in quitting substance abuse is recognizing the problem and realizing that one has the power to change. Quitting requires a plan and support from others. It is often necessary to seek medical assistance. Caregivers are available to offer counseling, and for certain cases, medicine to diminish the physical symptoms of withdrawal. Many organizations exist such as Alcoholics Anonymous, Narcotics Anonymous, or the  [employer] on Alcoholism that offer support for individuals who have chosen to quit their habits. Document Released: 10/16/2005  Document Revised: 03/02/2014 Document Reviewed: 01/28/2009 [facility] Patient Information 2015 [place], [place]. This information is not intended to replace advice given to you by your health care provider. Make sure you discuss any questions you have with your health care provider.  Schizoaffective Disorder Schizoaffective disorder (ScAD) is a mental illness. It causes symptoms that are a mixture of schizophrenia (a psychotic disorder) and an affective (mood) disorder. The schizophrenic symptoms may include delusions, hallucinations, or odd behavior. The mood symptoms may be similar to major depression or bipolar disorder. ScAD may interfere with personal relationships or normal daily activities. People with ScAD are at increased risk for job loss, social isolation,physical health problems, anxiety and substance use disorders, and suicide. ScAD usually occurs in cycles. Periods of severe symptoms are followed by periods of less severe symptoms or improvement. The illness affects men and women equally but usually appears at an earlier age (teenage or early adult years) in men. People who have family members with schizophrenia, bipolar disorder, or ScAD are at higher risk of developing ScAD. SYMPTOMS  At any one time, people with ScAD may have psychotic symptoms only or both psychotic and mood symptoms. The psychotic symptoms include one or more of the following:  Hearing, seeing, or feeling things that are not there (hallucinations).   Having fixed, false beliefs (delusions). The delusions usually are of being attacked, harassed, cheated, persecuted, or conspired against (paranoid delusions).  Speaking in a way that makes no sense to others (disorganized speech). The psychotic symptoms of ScAD may also include confusing or odd behavior or any of the negative symptoms of schizophrenia. These include loss of motivation for normal daily activities, such as bathing or grooming, withdrawal from other  people, and lack of emotions.  The mood symptoms of ScAD occur more often than not. They resemble major depressive disorder or bipolar mania. Symptoms of major depression include depressed mood and four or more of the following:  Loss of interest in usually pleasurable activities (anhedonia).  Sleeping more or less than normal.  Feeling worthless or excessively guilty.  Lack of energy or motivation.  Trouble concentrating.  Eating more or less than usual.  Thinking a lot about death or suicide. Symptoms of bipolar mania include abnormally elevated or irritable mood and increased energy or activity, plus three or more of the following:   More confidence than normal or feeling that you are able to do anything (grandiosity).  Feeling rested with less sleep than normal.   Being easily distracted.   Talking more than usual or feeling pressured to keep talking.   Feeling that your thoughts are racing.  Engaging in high-risk activities such as buying sprees or foolish business decisions. DIAGNOSIS  ScAD is diagnosed through an assessment by your health care provider. Your health care provider will observe and ask questions about your thoughts, behavior, mood, and ability to function in daily life. Your health care provider may also ask questions about your medical history and use of drugs, including prescription medicines. Your health care provider may also order blood tests and imaging exams. Certain medical conditions and substances can cause symptoms that resemble ScAD. Your health care provider may refer you to a mental health specialist for evaluation.  ScAD is divided into two types. The depressive type is diagnosed if your mood symptoms are limited to major depression. The bipolar type is diagnosed if your mood  symptoms are manic or a mixture of manic and depressive symptoms TREATMENT  ScAD is usually a lifelong illness. Long-term treatment is necessary. The following  treatments are available:  Medicine. Different types of medicine are used to treat ScAD. The exact combination depends on the type and severity of your symptoms. Antipsychotic medicine is used to control psychotic symptomssuch as delusions, paranoia, and hallucinations. Mood stabilizers can even the highs and lows of bipolar manic mood swings. Antidepressant medicines are used to treat major depressive symptoms.  Counseling or talk therapy. Individual, group, or family counseling may be helpful in providing education, support, and guidance. Many people with ScAD also benefit from social skills and job skills (vocational) training. A combination of medicine and counseling is usually best for managing the disorder over time. A procedure in which electricity is applied to the brain through the scalp (electroconvulsive therapy) may be used to treat people with severe manic symptoms that do not respond to medicine and counseling. HOME CARE INSTRUCTIONS   Take all your medicine as prescribed.  Check with your health care provider before starting new prescription or over-the-counter medicines.  Keep all follow up appointments with your health care provider. SEEK MEDICAL CARE IF:   If you are not able to take your medicines as prescribed.  If your symptoms get worse. SEEK IMMEDIATE MEDICAL CARE IF:   You have serious thoughts about hurting yourself or others. Document Released: 02/26/2007 Document Revised: 03/02/2014 Document Reviewed: 05/30/2013 [facility] Patient Information 2015 [place], [place]. This information is not intended to replace advice given to you by your health care provider. Make sure you discuss any questions you have with your health care provider.  Polysubstance Abuse When people abuse more than one drug or type of drug it is called polysubstance or polydrug abuse. For example, many smokers also drink alcohol. This is one form of polydrug abuse. Polydrug abuse also refers to the  use of a drug to counteract an unpleasant effect produced by another drug. It may also be used to help with withdrawal from another drug. People who take stimulants may become agitated. Sometimes this agitation is countered with a tranquilizer. This helps protect against the unpleasant side effects. Polydrug abuse also refers to the use of different drugs at the same time.  Anytime drug use is interfering with normal living activities, it has become abuse. This includes problems with family and friends. Psychological dependence has developed when your mind tells you that the drug is needed. This is usually followed by physical dependence which has developed when continuing increases of drug are required to get the same feeling or "high". This is known as addiction or chemical dependency. A person's risk is much higher if there is a history of chemical dependency in the family. SIGNS OF CHEMICAL DEPENDENCY  You have been told by friends or family that drugs have become a problem.  You fight when using drugs.  You are having blackouts (not remembering what you do while using).  You feel sick from using drugs but continue using.  You lie about use or amounts of drugs (chemicals) used.  You need chemicals to get you going.  You are suffering in work performance or in school because of drug use.  You get sick from use of drugs but continue to use anyway.  You need drugs to relate to people or feel comfortable in social situations.  You use drugs to forget problems. "Yes" answered to any of the above signs of chemical dependency  indicates there are problems. The longer the use of drugs continues, the greater the problems will become. If there is a family history of drug or alcohol use, it is best not to experiment with these drugs. Continual use leads to tolerance. After tolerance develops more of the drug is needed to get the same feeling. This is followed by addiction. With addiction, drugs  become the most important part of life. It becomes more important to take drugs than participate in the other usual activities of life. This includes relating to friends and family. Addiction is followed by dependency. Dependency is a condition where drugs are now needed not just to get high, but to feel normal. Addiction cannot be cured but it can be stopped. This often requires outside help and the care of professionals. Treatment centers are listed in the yellow pages under: Cocaine, Narcotics, and Alcoholics Anonymous. Most hospitals and clinics can refer you to a specialized care center. Talk to your caregiver if you need help. Document Released: 06/07/2005 Document Revised: 01/08/2012 Document Reviewed: 10/16/2005 [facility] Patient Information 2015 [place], [place]. This information is not intended to replace advice given to you by your health care provider. Make sure you discuss any questions you have with your health care provider.

## 2015-01-19 ENCOUNTER — Encounter (HOSPITAL_COMMUNITY): Payer: Self-pay

## 2015-01-19 ENCOUNTER — Emergency Department (HOSPITAL_COMMUNITY)
Admission: EM | Admit: 2015-01-19 | Discharge: 2015-01-20 | Disposition: A | Discharge status: Home / Self Care | Payer: Federal, State, Local not specified - Other | Attending: Emergency Medicine | Admitting: Emergency Medicine

## 2015-01-19 DIAGNOSIS — F209 Schizophrenia, unspecified: Secondary | ICD-10-CM | POA: Insufficient documentation

## 2015-01-19 DIAGNOSIS — F259 Schizoaffective disorder, unspecified: Secondary | ICD-10-CM

## 2015-01-19 DIAGNOSIS — Z720 Tobacco use: Secondary | ICD-10-CM | POA: Insufficient documentation

## 2015-01-19 DIAGNOSIS — F250 Schizoaffective disorder, bipolar type: Secondary | ICD-10-CM | POA: Diagnosis present

## 2015-01-19 DIAGNOSIS — Z79899 Other long term (current) drug therapy: Secondary | ICD-10-CM | POA: Insufficient documentation

## 2015-01-19 DIAGNOSIS — F309 Manic episode, unspecified: Secondary | ICD-10-CM | POA: Insufficient documentation

## 2015-01-19 LAB — RAPID URINE DRUG SCREEN, HOSP PERFORMED
Amphetamines: NOT DETECTED
Barbiturates: NOT DETECTED
Benzodiazepines: NOT DETECTED
Cocaine: NOT DETECTED
Opiates: NOT DETECTED
Tetrahydrocannabinol: POSITIVE — AB

## 2015-01-19 LAB — COMPREHENSIVE METABOLIC PANEL
ALT: 32 U/L (ref 0–53)
AST: 38 U/L — AB (ref 0–37)
Albumin: 4.5 g/dL (ref 3.5–5.2)
Alkaline Phosphatase: 77 U/L (ref 39–117)
Anion gap: 10 (ref 5–15)
BUN: 7 mg/dL (ref 6–23)
CO2: 26 mmol/L (ref 19–32)
Calcium: 9.3 mg/dL (ref 8.4–10.5)
Chloride: 106 mmol/L (ref 96–112)
Creatinine, Ser: 1.02 mg/dL (ref 0.50–1.35)
GFR calc Af Amer: >90 mL/min (ref >90)
GFR calc non Af Amer: >90 mL/min (ref >90)
GLUCOSE: 83 mg/dL (ref 70–99)
POTASSIUM: 3.6 mmol/L (ref 3.5–5.1)
SODIUM: 142 mmol/L (ref 135–145)
Total Bilirubin: 1.1 mg/dL (ref 0.3–1.2)
Total Protein: 7.4 g/dL (ref 6.0–8.3)

## 2015-01-19 LAB — CBC
HCT: 40.7 % (ref 39.0–52.0)
Hemoglobin: 13.2 g/dL (ref 13.0–17.0)
MCH: 28.4 pg (ref 26.0–34.0)
MCHC: 32.4 g/dL (ref 30.0–36.0)
MCV: 87.5 fL (ref 78.0–100.0)
Platelets: 306 10*3/uL (ref 150–400)
RBC: 4.65 MIL/uL (ref 4.22–5.81)
RDW: 12.5 % (ref 11.5–15.5)
WBC: 6.7 10*3/uL (ref 4.0–10.5)

## 2015-01-19 LAB — ETHANOL: Alcohol, Ethyl (B): <5 mg/dL (ref 0–9)

## 2015-01-19 LAB — LITHIUM LEVEL: Lithium Lvl: 0.08 mmol/L — ABNORMAL LOW (ref 0.80–1.40)

## 2015-01-19 MED ORDER — BENZTROPINE MESYLATE 1 MG PO TABS
1.0000 mg | ORAL_TABLET | Freq: Two times a day (BID) | ORAL | Status: DC
  Administered 2015-01-19 – 2015-01-20 (×2): 1 mg via ORAL
  Filled 2015-01-19 (×2): qty 1

## 2015-01-19 MED ORDER — AMITRIPTYLINE HCL 25 MG PO TABS
25.0000 mg | ORAL_TABLET | Freq: Every day | ORAL | Status: DC
  Administered 2015-01-19: 25 mg via ORAL
  Filled 2015-01-19: qty 1

## 2015-01-19 MED ORDER — HYDROXYZINE HCL 25 MG PO TABS: 25.0000 mg | ORAL_TABLET | Freq: Four times a day (QID) | ORAL | Status: DC | PRN

## 2015-01-19 MED ORDER — OLANZAPINE 10 MG PO TBDP
20.0000 mg | ORAL_TABLET | Freq: Every day | ORAL | Status: DC
  Administered 2015-01-19: 20 mg via ORAL
  Filled 2015-01-19: qty 2

## 2015-01-19 MED ORDER — LITHIUM CARBONATE 300 MG PO CAPS
300.0000 mg | ORAL_CAPSULE | Freq: Two times a day (BID) | ORAL | Status: DC
  Administered 2015-01-20: 300 mg via ORAL
  Filled 2015-01-19 (×2): qty 1

## 2015-01-19 NOTE — BH Assessment (Signed)
TTS ([place]) will assess the patient.  

## 2015-01-19 NOTE — Progress Notes (Deleted)
  CARE MANAGEMENT ED NOTE 01/19/2015  Patient:  Philip [name],Philip [name]   Account Number:  [id number]  Date Initiated:  01/19/2015  Documentation initiated by:  Edd ArbourGIBBS,KIMBERLY  Subjective/Objective Assessment:   24 yr old bcbs pt [place] shopping [place] with only pants and socks on.  Walking around.  Has been fighting family.  Sister took out papers. Pt is bipolar/manic.     Subjective/Objective Assessment Detail:   no pcp listed pt states pcp is Lauralyn PrimesKimberly barnes pediatrician but has not seen her in a long time  Pt inquired if she could be assisted by Cm to reach her friend to provide her a ride States she can not recall friend name but only college email account Pt allowed by CM to access her  Philip [name]  [email or web address] account to tell friend [facility] and she found friend's number on the site and wrote it down to call Pt very appreciative of assistance form CM  [phone] Found     Action/Plan:   CM assiste pt to reach his friend at school Updated pcp name Updated ED SW on pt emailing her friend and finding friend number to contact for ride home   Action/Plan Detail:   Anticipated DC Date:  01/19/2015     Status Recommendation to Physician:   Result of Recommendation:    Other ED Services  Consult Working Plan    DC Planning Services  Other  Outpatient Services - Pt will follow up  PCP issues    Choice offered to / List presented to:            Status of service:  Completed, signed off  ED Comments:   ED Comments Detail:

## 2015-01-19 NOTE — ED Provider Notes (Signed)
CSN: [id number]     Arrival date & time 01/19/15  1152 History   First MD Initiated Contact with Patient 01/19/15 1220     Chief Complaint  Patient presents with  . Altered Mental Status  . Manic Behavior     (Consider location/radiation/quality/duration/timing/severity/associated sxs/prior Treatment) Patient is a 24 y.o. male presenting with altered mental status. The history is provided by the patient and the police. The history is limited by the condition of the patient.  Altered Mental Status Associated symptoms: no fever   pt with hx schizoaffective disorder, bipolar disorder, presents via gpd with ivc papers.  By report, pt in manic, agitated state, wandering around neighborhood and local mall/store area, taking off clothing items, confronting strangers, speaking non sensically.  Per report pt trying to fight w stranger and family member. Hx similar symptoms in past related to psychiatric illness, w multiple hospitalizations in past. Pt very limited historian - level 5 caveat. Pt does indicate compliant w meds.     Past Medical History  Diagnosis Date  . Bipolar affective disorder   . Schizophrenia   . ADHD (attention deficit hyperactivity disorder)    No past surgical history on file. Family History  Problem Relation Age of Onset  . Hypertension Mother    History  Substance Use Topics  . Smoking status: Current Every Day Smoker -- 1.00 packs/day for 6 years  . Smokeless tobacco: Not on file  . Alcohol Use: 0.6 oz/week    1 Cans of beer per week       Review of Systems  Unable to perform ROS: Psychiatric disorder  Constitutional: Negative for fever.  level 5 caveat, pt uncooperative, psychiatric illness    Allergies  Shrimp  Home Medications   Prior to Admission medications   Medication Sig Start Date End Date Taking? Authorizing Provider  amitriptyline (ELAVIL) 25 MG tablet Take 1 tablet (25 mg total) by mouth at bedtime. 09/10/14   Adonis Brook, NP   benztropine (COGENTIN) 1 MG tablet Take 1 tablet (1 mg total) by mouth 2 (two) times daily. 09/10/14   Adonis Brook, NP  hydrOXYzine (ATARAX/VISTARIL) 25 MG tablet Take 1 tablet (25 mg total) by mouth every 6 (six) hours as needed for anxiety. 09/10/14   Adonis Brook, NP  lactulose (CHRONULAC) 10 GM/15ML solution Take 30 mLs (20 g total) by mouth 3 (three) times daily. 09/10/14   Adonis Brook, NP  lithium carbonate 300 MG capsule Take 1 capsule (300 mg total) by mouth 2 (two) times daily with a meal. 09/10/14   Adonis Brook, NP  OLANZapine zydis (ZYPREXA) 20 MG disintegrating tablet Take 1 tablet (20 mg total) by mouth at bedtime. 09/10/14   Adonis Brook, NP   There were no vitals taken for this visit. Physical Exam  Constitutional: He appears well-developed and well-nourished. No distress.  HENT:  Head: Atraumatic.  Mouth/Throat: Oropharynx is clear and moist.  Eyes: Conjunctivae are normal. Pupils are equal, round, and reactive to light. No scleral icterus.  Neck: Neck supple. No tracheal deviation present.  Cardiovascular: Normal rate, regular rhythm, normal heart sounds and intact distal pulses.   Pulmonary/Chest: Effort normal and breath sounds normal. No accessory muscle usage. No respiratory distress.  Abdominal: Soft. He exhibits no distension. There is no tenderness.  Musculoskeletal: Normal range of motion. He exhibits no edema or tenderness.  Neurological: He is alert.  Ambulates w steady gait  Skin: Skin is warm and dry. He is not diaphoretic.  Psychiatric:  Pt  w periods agitated behavior, restlessness. Exhibits pressure speech, rapidly moves from one unrelated thought process to next.   Nursing note and vitals reviewed.   ED Course  Procedures (including critical care time) Labs Review  Results for orders placed or performed during the hospital encounter of 01/19/15  CBC  Result Value Ref Range   WBC 6.7 4.0 - 10.5 K/uL   RBC 4.65 4.22 - 5.81 MIL/uL    Hemoglobin 13.2 13.0 - 17.0 g/dL   HCT 91.440.7 78.239.0 - 95.652.0 %   MCV 87.5 78.0 - 100.0 fL   MCH 28.4 26.0 - 34.0 pg   MCHC 32.4 30.0 - 36.0 g/dL   RDW 21.312.5 08.611.5 - 57.815.5 %   Platelets 306 150 - 400 K/uL  Comprehensive metabolic panel  Result Value Ref Range   Sodium 142 135 - 145 mmol/L   Potassium 3.6 3.5 - 5.1 mmol/L   Chloride 106 96 - 112 mmol/L   CO2 26 19 - 32 mmol/L   Glucose, Bld 83 70 - 99 mg/dL   BUN 7 6 - 23 mg/dL   Creatinine, Ser 4.691.02 0.50 - 1.35 mg/dL   Calcium 9.3 8.4 - 62.910.5 mg/dL   Total Protein 7.4 6.0 - 8.3 g/dL   Albumin 4.5 3.5 - 5.2 g/dL   AST 38 (H) 0 - 37 U/L   ALT 32 0 - 53 U/L   Alkaline Phosphatase 77 39 - 117 U/L   Total Bilirubin 1.1 0.3 - 1.2 mg/dL   GFR calc non Af Amer >90 >90 mL/min   GFR calc Af Amer >90 >90 mL/min   Anion gap 10 5 - 15  Ethanol  Result Value Ref Range   Alcohol, Ethyl (B) <5 0 - 9 mg/dL      MDM   Labs.  Psych team consulted.  Reviewed nursing notes and prior charts for additional history.   Recheck pt calm and alert.  Psych hold orders placed, home meds ordered.  Lit level pending.  Inpatient psych placement per psych team.       Cathren LaineKevin Chandon Lazcano, MD 01/19/15 [phone]

## 2015-01-19 NOTE — ED Notes (Signed)
Pt found in shopping center with only pants and socks on.  Walking around.  Has been fighting family.  Sister took out papers. Pt is bipolar/manic.  Says he is taking meds?  Alert to self. Not oriented to situation.  Ran from [employer]

## 2015-01-19 NOTE — ED Notes (Signed)
Bed: WBH40 Expected date:  Expected time:  Means of arrival:  Comments: Triage 3 

## 2015-01-19 NOTE — ED Notes (Addendum)
Pt has been generally calm and redirects. Refused Lithium stating he was in control of his mood and did not need medicine. Writer reinforced need but pt began to escalate. Acuity is moderate

## 2015-01-19 NOTE — ED Notes (Signed)
Pt resting quietly in bed with eyes closed. Respirations are even and unlabored. No acute distress noted. Safety has been maintained with q15 minute observation. Will continue current POC 

## 2015-01-19 NOTE — Progress Notes (Signed)
  CARE MANAGEMENT ED NOTE 01/19/2015  Patient:  Raymondo BandJAMES,Rogerick D   Account Number:  [id number]  Date Initiated:  01/19/2015  Documentation initiated by:  Edd ArbourGIBBS,KIMBERLY  Subjective/Objective Assessment:   24 [place] with altered mental status and manic behavior     Subjective/Objective Assessment Detail:   no pcp     Action/Plan:   [place]   Action/Plan Detail:   Anticipated DC Date:  01/19/2015     Status Recommendation to Physician:   Result of Recommendation:    Other ED Services  Consult Working Plan    DC Planning Services  Other  Outpatient Services - Pt will follow up  PCP issues    Choice offered to / List presented to:            Status of service:  Completed, signed off  ED Comments:   ED Comments Detail:  CM spoke with pt who [place]. CM discussed and provided written information for self pay pcps, importance of pcp for f/u care, www.needymeds.org, www.goodrx.com, discounted pharmacies and other [place] resources such as [facility], [employer], affordable care act,  financial assistance, DSS and  health department  Reviewed resources for [place] self pay pcps like [facility] [place], [facility] medicine at [address], [facility], general medical clinics, [facility] plus others, medication resources, [facility] out patient pharmacies and housing Pt voiced understanding and appreciation of resources provided  Provided [facility] contact information Not wanting referral

## 2015-01-19 NOTE — BH Assessment (Addendum)
Assessment Note  Philip [name] is an 24 y.o. male with history of Bipolar Affective Disorder, Schizophrenia, and ADHD. Patient presents to [facility] with [employer] under IVC. The petitioner is patient's sister. Pt found in shopping center with only pants and socks on. Walking around in a agitated state, wandering around neighborhood and local mall/store area, taking off clothing items, confronting strangers, speaking non sensically.Marland Kitchen Has been fighting family and tried to fight w stranger and family member.   Pt denies SI, HI and AVH at this time. Pt did not report any previous suicide attempts. Pt mother reported that he hospitalized at [facility] in 2013.  Pt did not report any depressive symptoms at this time but shared that he is dealing with a stressful situation. Pt stated "I have a lawsuit against [employer] for harassment". Pt did not report any alcohol or illicit substance abuse; however his drug screen has not yet resulted. Patient was positive for THC.   Pt is alert and oriented x3. Patient presents with disorganized and grandeous thoughts and flight of ideas.  Pt maintained poor eye contact throughout this assessment. Pt mood is preoccupied and his affect is congruent with his mood. Pt thought process is tangential and his insight and judgment is poor.   Patient hospitalized at [facility] in the past (1x) and also seeks outpatient services with [facility].     Axis I:  Bipolar Affective Disorder, Schizophrenia, and ADHD Axis II: Deferred Axis III:  Past Medical History  Diagnosis Date  . Bipolar affective disorder   . Schizophrenia   . ADHD (attention deficit hyperactivity disorder)    Axis IV: other psychosocial or environmental problems, problems related to social environment, problems with access to health care services and problems with primary support group Axis V: 31-40 impairment in reality testing  Past Medical History:  Past Medical History  Diagnosis Date  . Bipolar affective  disorder   . Schizophrenia   . ADHD (attention deficit hyperactivity disorder)     History reviewed. No pertinent past surgical history.  Family History:  Family History  Problem Relation Age of Onset  . Hypertension Mother     Social History:  reports that he has been smoking.  He does not have any smokeless tobacco history on file. He reports that he drinks about 0.6 oz of alcohol per week. He reports that he uses illicit drugs (Marijuana).  Additional Social History:  Alcohol / Drug Use Pain Medications: SEE MAR Prescriptions: SEE MAR Over the Counter: SEE MAR History of alcohol / drug use?: Yes Substance #1 Name of Substance 1: Patient has a hx of THC use. Patient however does not provide details of THC use due to mental state. UDS pending as of 01/19/2015 @ 1321. 1 - Age of First Use: unk 1 - Amount (size/oz): unk 1 - Frequency: unk 1 - Duration: unk 1 - Last Use / Amount: unk Substance #2 Name of Substance 2: Patient has a hx of Barbituates use. Patient however does not provide details of Barbituates use due to mental state. UDS pending as of 01/19/2015 @ 1321. 2 - Age of First Use: unk 2 - Amount (size/oz): unk 2 - Frequency: unk 2 - Duration: unk 2 - Last Use / Amount: unk  CIWA: CIWA-Ar BP: 116/83 mmHg Pulse Rate: 100 COWS:    Allergies:  Allergies  Allergen Reactions  . Shrimp [Shellfish Allergy] Anaphylaxis    Home Medications:  (Not in a hospital admission)  OB/GYN Status:  No LMP  for male patient.  General Assessment Data Location of Assessment: [facility] Is this a Tele or Face-to-Face Assessment?: Face-to-Face Living Arrangements: Other (Comment), Non-relatives/Friends (Pt sts, "I live with my besties"; sts he has girl room mates) Can pt return to current living arrangement?: Yes Admission Status: Voluntary Is patient capable of signing voluntary admission?: Yes Transfer from: Acute Hospital Referral Source: Self/Family/Friend     [facility]  Plan Living Arrangements: Other (Comment), Non-relatives/Friends (Pt sts, "I live with my besties"; sts he has girl room mates) Name of Psychiatrist:  (No psychiatrist ) Name of Therapist:  (No thearpist )  Education Status Is patient currently in school?: No  Risk to self with the past 6 months Suicidal Ideation: No Suicidal Intent: No Is patient at risk for suicide?: No Suicidal Plan?: No Access to Means: No Specify Access to Suicidal Means:  (n/a) What has been your use of drugs/alcohol within the last 12 months?:  (patient denies current drug use; awaiting UDS to result as o) Previous Attempts/Gestures: No How many times?:  (0) Other Self Harm Risks:  (n/a) Triggers for Past Attempts: Other (Comment) (no previous attempts/gestures ) Intentional Self Injurious Behavior: None Family Suicide History: No Recent stressful life event(s): Other (Comment) (patient does not identify any stressors) Persecutory voices/beliefs?: No Depression: Yes Depression Symptoms: Feeling angry/irritable, Isolating, Despondent Substance abuse history and/or treatment for substance abuse?: No Suicide prevention information given to non-admitted patients: Not applicable  Risk to Others within the past 6 months Homicidal Ideation: No Thoughts of Harm to Others: No Current Homicidal Intent: No Current Homicidal Plan: No Access to Homicidal Means: No Identified Victim:  (n/a) History of harm to others?: No Assessment of Violence: None Noted Violent Behavior Description:  (Patient appears manic, restless, hyper, etc. ) Does patient have access to weapons?: No Criminal Charges Pending?: No Does patient have a court date: No  Psychosis Hallucinations: None noted Delusions: None noted  Mental Status Report Appearance/Hygiene: Disheveled Eye Contact: Good Motor Activity: Freedom of movement Speech: Logical/coherent Level of Consciousness: Alert, Restless, Irritable Mood: Anxious, Apprehensive,  Preoccupied Affect: Anxious, Apprehensive, Euphoric, Irritable, Inconsistent with thought content, Preoccupied Anxiety Level: None Thought Processes: Relevant, Coherent Judgement: Impaired Orientation: Person, Place, Situation, Time Obsessive Compulsive Thoughts/Behaviors: None  Cognitive Functioning Concentration: Decreased Memory: Recent Intact, Remote Intact IQ: Average Insight: Fair Impulse Control: Good Appetite: Fair Weight Loss:  (none reported ) Weight Gain:  (none reported ) Sleep: Decreased Total Hours of Sleep:  (unk) Vegetative Symptoms: None  ADLScreening [facility] Assessment Services) Patient's cognitive ability adequate to safely complete daily activities?: Yes Patient able to express need for assistance with ADLs?: Yes Independently performs ADLs?: Yes (appropriate for developmental age)  Prior Inpatient Therapy Prior Inpatient Therapy: Yes Prior Therapy Dates:  ([facility]-patient unable to recall dates of admission) Prior Therapy Facilty/Provider(s):  [facility]) Reason for Treatment:  (n/a)  Prior Outpatient Therapy Prior Outpatient Therapy: Yes Prior Therapy Dates:  ([facility]-current) Prior Therapy Facilty/Provider(s):  [facility]) Reason for Treatment:  (med management)  ADL Screening (condition at time of admission) Patient's cognitive ability adequate to safely complete daily activities?: Yes Is the patient deaf or have difficulty hearing?: No Does the patient have difficulty seeing, even when wearing glasses/contacts?: No Does the patient have difficulty concentrating, remembering, or making decisions?: Yes (due to mental state) Patient able to express need for assistance with ADLs?: Yes Does the patient have difficulty dressing or bathing?: No Independently performs ADLs?: Yes (appropriate for developmental age) Does the patient have difficulty walking  or climbing stairs?: No Weakness of Legs: None Weakness of Arms/Hands: None  Home Assistive  Devices/Equipment Home Assistive Devices/Equipment: None    Abuse/Neglect Assessment (Assessment to be complete while patient is alone) Physical Abuse: Denies Verbal Abuse: Denies Sexual Abuse: Denies Exploitation of patient/patient's resources: Denies Self-Neglect: Denies Values / Beliefs Cultural Requests During Hospitalization: None Spiritual Requests During Hospitalization: None   Advance Directives (For Healthcare) Does patient have an advance directive?: No Would patient like information on creating an advanced directive?: No - patient declined information    Additional Information 1:1 In Past 12 Months?: No CIRT Risk: No Elopement Risk: No Does patient have medical clearance?: Yes     Disposition:  Disposition Initial Assessment Completed for this Encounter: Yes Disposition of Patient: Other dispositions (Dr. Ladona Ridgelaylor and Julieanne CottonJosephine, NP recommend inpatient treatment. )  On Site Evaluation by:   Reviewed with Physician:    Melynda Rippleerry, Yuette Putnam [facility]Mona 01/19/2015 1:49 PM

## 2015-01-20 DIAGNOSIS — F250 Schizoaffective disorder, bipolar type: Secondary | ICD-10-CM

## 2015-01-20 MED ORDER — AMITRIPTYLINE HCL 25 MG PO TABS: 25.0000 mg | ORAL_TABLET | Freq: Every day | ORAL | Status: DC

## 2015-01-20 MED ORDER — LITHIUM CARBONATE 300 MG PO CAPS: 300.0000 mg | ORAL_CAPSULE | Freq: Two times a day (BID) | ORAL | Status: DC

## 2015-01-20 MED ORDER — BENZTROPINE MESYLATE 1 MG PO TABS: 1.0000 mg | ORAL_TABLET | Freq: Two times a day (BID) | ORAL | Status: DC

## 2015-01-20 MED ORDER — AMPHETAMINE-DEXTROAMPHETAMINE 20 MG PO TABS: 20.0000 mg | ORAL_TABLET | Freq: Every day | ORAL | Status: DC

## 2015-01-20 MED ORDER — OLANZAPINE 20 MG PO TBDP: 20.0000 mg | ORAL_TABLET | Freq: Every day | ORAL | Status: DC

## 2015-01-20 NOTE — ED Notes (Signed)
Pt resting in bed, eyes closed, breathing even and unlabored. Q15 min safety checks maintained. Will continue to monitor.  

## 2015-01-20 NOTE — ED Notes (Signed)
Patient discharged per provider order.  Patient received all personal belongings and prescriptions.  Patient denies SI/HI/AVH.  Patient left ambulatory.  He was pleasant and cooperative and appreciative of staff's assistance.

## 2015-01-20 NOTE — Consult Note (Signed)
[facility] Consult   Reason for Consult:  Agitation Referring Physician:  EDP Patient Identification: Philip [name] MRN:  [id number] Principal Diagnosis: Schizoaffective disorder, bipolar type Diagnosis:   Patient Active Problem List   Diagnosis Date Noted  . Schizoaffective disorder, bipolar type [F25.0]     Priority: High  . Agitation [R45.1] 09/16/2014  . Polysubstance abuse [F19.10] 09/16/2014  . Substance induced mood disorder [F19.94] 09/16/2014  . Cannabis use disorder, severe, dependence [F12.20]   . Hyperammonemia [E72.20] 09/07/2014  . Psychotic disorder [F29] 09/01/2014  . Bipolar affective disorder, current episode manic with psychotic symptoms [F31.2] 08/31/2014    Total Time spent with patient: 1 hour  Subjective:   Philip [name] is a 24 y.o. male patient admitted with agitation.  HPI: AA male, 24 years old was evaluated this morning known to Dr Darleene Cleaver for agitation.  Patient was brought in yesterday under IVC by  His sister for walking with only pants and socks.  Patient at the time wanted to sleep and did not participate in the interview.  It was documented that he was fighting family members and was not taking his medications.  Today, patient participated in the assessment and stated that his family members are encroaching in his affairs.  He admitted to not taking his medications because he ran out of all of them.  Patient reported that he is in a Bath and is trying to be serious with his profession.  Patient reported that he has not been sleeping well but stated that he feels refreshed now that he got some sleep last night.  He answered all questions asked of him promptly without hesitation.  He denied SI/HI/AVH.  He stated that he want to be be discharged home and plans to go to [facility] for huis medications.   Patient is discharged home with prescriptions.  He has been advised to keep his outpatient appointment and therapy with  [facility].  HPI Elements:   Location:  Schizoaffective disorder, Bipolar type, medication non compliance. Quality:  severe, agitation, medication non compliance. Severity:  severe. Timing:  acute. Duration:  Chronic Mental illness. Context:  IVC BY HIS SISTER.  Past Medical History:  Past Medical History  Diagnosis Date  . Bipolar affective disorder   . Schizophrenia   . ADHD (attention deficit hyperactivity disorder)    History reviewed. No pertinent past surgical history. Family History:  Family History  Problem Relation Age of Onset  . Hypertension Mother    Social History:  History  Alcohol Use  . 0.6 oz/week  . 1 Cans of beer per week     History  Drug Use  . Yes  . Special: Marijuana    History   Social History  . Marital Status: Married    Spouse Name: N/A  . Number of Children: N/A  . Years of Education: N/A   Social History Main Topics  . Smoking status: Current Every Day Smoker -- 1.00 packs/day for 6 years  . Smokeless tobacco: Not on file  . Alcohol Use: 0.6 oz/week    1 Cans of beer per week  . Drug Use: Yes    Special: Marijuana  . Sexual Activity: Not Currently   Other Topics Concern  . None   Social History Narrative   Additional Social History:    Pain Medications: SEE MAR Prescriptions: SEE MAR Over the Counter: SEE MAR History of alcohol / drug use?: Yes Name of Substance 1: Patient has a hx of  THC use. Patient however does not provide details of THC use due to mental state. UDS pending as of 01/19/2015 @ 1321. 1 - Age of First Use: unk 1 - Amount (size/oz): unk 1 - Frequency: unk 1 - Duration: unk 1 - Last Use / Amount: unk Name of Substance 2: Patient has a hx of Barbituates use. Patient however does not provide details of Barbituates use due to mental state. UDS pending as of 01/19/2015 @ 1321. 2 - Age of First Use: unk 2 - Amount (size/oz): unk 2 - Frequency: unk 2 - Duration: unk 2 - Last Use / Amount: unk                  Allergies:   Allergies  Allergen Reactions  . Shrimp [Shellfish Allergy] Anaphylaxis    Labs:  Results for orders placed or performed during the hospital encounter of 01/19/15 (from the past 48 hour(s))  Urine rapid drug screen (hosp performed)     Status: Abnormal   Collection Time: 01/19/15 11:52 AM  Result Value Ref Range   Opiates NONE DETECTED NONE DETECTED   Cocaine NONE DETECTED NONE DETECTED   Benzodiazepines NONE DETECTED NONE DETECTED   Amphetamines NONE DETECTED NONE DETECTED   Tetrahydrocannabinol POSITIVE (A) NONE DETECTED   Barbiturates NONE DETECTED NONE DETECTED    Comment:        DRUG SCREEN FOR MEDICAL PURPOSES ONLY.  IF CONFIRMATION IS NEEDED FOR ANY PURPOSE, NOTIFY LAB WITHIN 5 DAYS.        LOWEST DETECTABLE LIMITS FOR URINE DRUG SCREEN Drug Class       Cutoff (ng/mL) Amphetamine      1000 Barbiturate      200 Benzodiazepine   827 Tricyclics       078 Opiates          300 Cocaine          300 THC              50   Ethanol     Status: None   Collection Time: 01/19/15 12:48 PM  Result Value Ref Range   Alcohol, Ethyl (B) <5 0 - 9 mg/dL    Comment:        LOWEST DETECTABLE LIMIT FOR SERUM ALCOHOL IS 11 mg/dL FOR MEDICAL PURPOSES ONLY   CBC     Status: None   Collection Time: 01/19/15 12:52 PM  Result Value Ref Range   WBC 6.7 4.0 - 10.5 K/uL   RBC 4.65 4.22 - 5.81 MIL/uL   Hemoglobin 13.2 13.0 - 17.0 g/dL   HCT 40.7 39.0 - 52.0 %   MCV 87.5 78.0 - 100.0 fL   MCH 28.4 26.0 - 34.0 pg   MCHC 32.4 30.0 - 36.0 g/dL   RDW 12.5 11.5 - 15.5 %   Platelets 306 150 - 400 K/uL  Comprehensive metabolic panel     Status: Abnormal   Collection Time: 01/19/15 12:52 PM  Result Value Ref Range   Sodium 142 135 - 145 mmol/L   Potassium 3.6 3.5 - 5.1 mmol/L   Chloride 106 96 - 112 mmol/L   CO2 26 19 - 32 mmol/L   Glucose, Bld 83 70 - 99 mg/dL   BUN 7 6 - 23 mg/dL   Creatinine, Ser 1.02 0.50 - 1.35 mg/dL   Calcium 9.3 8.4 - 10.5 mg/dL   Total Protein  7.4 6.0 - 8.3 g/dL   Albumin 4.5 3.5 - 5.2 g/dL   AST  38 (H) 0 - 37 U/L   ALT 32 0 - 53 U/L   Alkaline Phosphatase 77 39 - 117 U/L   Total Bilirubin 1.1 0.3 - 1.2 mg/dL   GFR calc non Af Amer >90 >90 mL/min   GFR calc Af Amer >90 >90 mL/min    Comment: (NOTE) The eGFR has been calculated using the CKD EPI equation. This calculation has not been validated in all clinical situations. eGFR's persistently <90 mL/min signify possible Chronic Kidney Disease.    Anion gap 10 5 - 15  Lithium level     Status: Abnormal   Collection Time: 01/19/15  1:00 PM  Result Value Ref Range   Lithium Lvl 0.08 (L) 0.80 - 1.40 mmol/L    Comment: REPEATED TO VERIFY    Vitals: Blood pressure 137/90, pulse 86, temperature 98 F (36.7 C), temperature source Oral, resp. rate 16, SpO2 99 %.  Risk to Self: Suicidal Ideation: No Suicidal Intent: No Is patient at risk for suicide?: No Suicidal Plan?: No Access to Means: No Specify Access to Suicidal Means:  (n/a) What has been your use of drugs/alcohol within the last 12 months?:  (patient denies current drug use; awaiting UDS to result as o) How many times?:  (0) Other Self Harm Risks:  (n/a) Triggers for Past Attempts: Other (Comment) (no previous attempts/gestures ) Intentional Self Injurious Behavior: None Risk to Others: Homicidal Ideation: No Thoughts of Harm to Others: No Current Homicidal Intent: No Current Homicidal Plan: No Access to Homicidal Means: No Identified Victim:  (n/a) History of harm to others?: No Assessment of Violence: None Noted Violent Behavior Description:  (Patient appears manic, restless, hyper, etc. ) Does patient have access to weapons?: No Criminal Charges Pending?: No Does patient have a court date: No Prior Inpatient Therapy: Prior Inpatient Therapy: Yes Prior Therapy Dates:  ([facility]-patient unable to recall dates of admission) Prior Therapy Facilty/Provider(s):  [facility]) Reason for Treatment:  (n/a) Prior Outpatient  Therapy: Prior Outpatient Therapy: Yes Prior Therapy Dates:  ([facility]-current) Prior Therapy Facilty/Provider(s):  [facility]) Reason for Treatment:  (med management)  Current Facility-Administered Medications  Medication Dose Route Frequency Provider Last Rate Last Dose  . amitriptyline (ELAVIL) tablet 25 mg  25 mg Oral QHS Lajean Saver, MD   25 mg at 01/19/15 2128  . benztropine (COGENTIN) tablet 1 mg  1 mg Oral BID Lajean Saver, MD   1 mg at 01/20/15 5364  . hydrOXYzine (ATARAX/VISTARIL) tablet 25 mg  25 mg Oral Q6H PRN Lajean Saver, MD      . lithium carbonate capsule 300 mg  300 mg Oral BID WC Lajean Saver, MD   300 mg at 01/20/15 6803  . OLANZapine zydis (ZYPREXA) disintegrating tablet 20 mg  20 mg Oral QHS Lajean Saver, MD   20 mg at 01/19/15 2128   Current Outpatient Prescriptions  Medication Sig Dispense Refill  . amitriptyline (ELAVIL) 25 MG tablet Take 1 tablet (25 mg total) by mouth at bedtime. 30 tablet 0  . benztropine (COGENTIN) 1 MG tablet Take 1 tablet (1 mg total) by mouth 2 (two) times daily. 60 tablet 0  . hydrOXYzine (ATARAX/VISTARIL) 25 MG tablet Take 1 tablet (25 mg total) by mouth every 6 (six) hours as needed for anxiety. 30 tablet 0  . amitriptyline (ELAVIL) 25 MG tablet Take 1 tablet (25 mg total) by mouth at bedtime. 30 tablet 0  . amphetamine-dextroamphetamine (ADDERALL) 20 MG tablet Take 1 tablet (20 mg total) by mouth daily. 14 tablet 0  .  benztropine (COGENTIN) 1 MG tablet Take 1 tablet (1 mg total) by mouth 2 (two) times daily. 60 tablet 0  . lactulose (CHRONULAC) 10 GM/15ML solution Take 30 mLs (20 g total) by mouth 3 (three) times daily. (Patient not taking: Reported on 01/19/2015) 1260 mL 0  . lithium carbonate 300 MG capsule Take 1 capsule (300 mg total) by mouth 2 (two) times daily with a meal. 60 capsule 0  . OLANZapine zydis (ZYPREXA) 20 MG disintegrating tablet Take 1 tablet (20 mg total) by mouth at bedtime. 30 tablet 0  . oxyCODONE-acetaminophen  (PERCOCET/ROXICET) 5-325 MG per tablet Take 1 tablet by mouth every 4 (four) hours as needed for severe pain.    . promethazine-codeine (PHENERGAN WITH CODEINE) 6.25-10 MG/5ML syrup Take 5 mLs by mouth every 6 (six) hours as needed for cough.      Musculoskeletal: Strength & Muscle Tone: within normal limits Gait & Station: normal Patient leans: N/A  Psychiatric Specialty Exam:     Blood pressure 137/90, pulse 86, temperature 98 F (36.7 C), temperature source Oral, resp. rate 16, SpO2 99 %.There is no weight on file to calculate BMI.  General Appearance: Casual and Fairly Groomed  Engineer, water::  Good  Speech:  Clear and Coherent and Normal Rate  Volume:  Normal  Mood:  Euthymic  Affect:  Congruent  Thought Process:  Coherent, Goal Directed and Intact  Orientation:  Full (Time, Place, and Person)  Thought Content:  WDL  Suicidal Thoughts:  No  Homicidal Thoughts:  No  Memory:  Immediate;   Good Recent;   Good Remote;   Fair  Judgement:  Good  Insight:  Good  Psychomotor Activity:  Normal  Concentration:  Good  Recall:  NA  Fund of Knowledge:Good  Language: Good  Akathisia:  NA  Handed:  Right  AIMS (if indicated):     Assets:  Desire for Improvement  ADL's:  Intact  Cognition: WNL  Sleep:      Medical Decision Making: Established Problem, Stable/Improving (1)  Treatment Plan Summary: Plan SEE BELOW, DISCHARGE HOME  Plan:  see below, discharge home Disposition: Discharge home  Delfin Gant    PMHNP-BC 01/20/2015 12:44 PM Patient seen face-to-face for psychiatric evaluation, chart reviewed and case discussed with the physician extender and developed treatment plan. Reviewed the information documented and agree with the treatment plan. Corena Pilgrim, MD

## 2015-01-20 NOTE — ED Notes (Signed)
Patient restless; pacing the hallway.  Had a verbal altercation with another patient on the other hall.  Situation escalated; staff was able to intervene.  Security was called and patient was directed back to his room.  Denies SI/HI/AVH.

## 2015-01-20 NOTE — Progress Notes (Signed)
Per psychiatrist, patient psychiatrically stable for discharge home. Pt to follow up with [facility]. [facility].   Olga CoasterKristen Bobbijo Holst, LCSW  Clinical Social Work  [facility] [phone]

## 2015-01-26 ENCOUNTER — Encounter (HOSPITAL_COMMUNITY): Payer: Self-pay | Admitting: Emergency Medicine

## 2015-01-26 ENCOUNTER — Emergency Department (HOSPITAL_COMMUNITY)
Admission: EM | Admit: 2015-01-26 | Discharge: 2015-01-26 | Disposition: A | Discharge status: Home / Self Care | Payer: Self-pay | Attending: Emergency Medicine | Admitting: Emergency Medicine

## 2015-01-26 DIAGNOSIS — F209 Schizophrenia, unspecified: Secondary | ICD-10-CM | POA: Insufficient documentation

## 2015-01-26 DIAGNOSIS — F1210 Cannabis abuse, uncomplicated: Secondary | ICD-10-CM | POA: Insufficient documentation

## 2015-01-26 DIAGNOSIS — Z720 Tobacco use: Secondary | ICD-10-CM | POA: Insufficient documentation

## 2015-01-26 DIAGNOSIS — Z79899 Other long term (current) drug therapy: Secondary | ICD-10-CM | POA: Insufficient documentation

## 2015-01-26 DIAGNOSIS — F319 Bipolar disorder, unspecified: Secondary | ICD-10-CM

## 2015-01-26 DIAGNOSIS — F3170 Bipolar disorder, currently in remission, most recent episode unspecified: Secondary | ICD-10-CM | POA: Insufficient documentation

## 2015-01-26 DIAGNOSIS — F909 Attention-deficit hyperactivity disorder, unspecified type: Secondary | ICD-10-CM | POA: Insufficient documentation

## 2015-01-26 LAB — COMPREHENSIVE METABOLIC PANEL
ALK PHOS: 76 U/L (ref 39–117)
ALT: 23 U/L (ref 0–53)
ANION GAP: 6 (ref 5–15)
AST: 20 U/L (ref 0–37)
Albumin: 4.3 g/dL (ref 3.5–5.2)
BILIRUBIN TOTAL: 0.3 mg/dL (ref 0.3–1.2)
BUN: 17 mg/dL (ref 6–23)
CALCIUM: 9.6 mg/dL (ref 8.4–10.5)
CO2: 25 mmol/L (ref 19–32)
Chloride: 105 mmol/L (ref 96–112)
Creatinine, Ser: 0.98 mg/dL (ref 0.50–1.35)
GFR calc Af Amer: >90 mL/min (ref >90)
GFR calc non Af Amer: >90 mL/min (ref >90)
Glucose, Bld: 90 mg/dL (ref 70–99)
Potassium: 4.3 mmol/L (ref 3.5–5.1)
SODIUM: 136 mmol/L (ref 135–145)
TOTAL PROTEIN: 7.4 g/dL (ref 6.0–8.3)

## 2015-01-26 LAB — RAPID URINE DRUG SCREEN, HOSP PERFORMED
AMPHETAMINES: NOT DETECTED
Barbiturates: NOT DETECTED
Benzodiazepines: NOT DETECTED
Cocaine: NOT DETECTED
OPIATES: NOT DETECTED
Tetrahydrocannabinol: POSITIVE — AB

## 2015-01-26 LAB — CBC
HCT: 40.3 % (ref 39.0–52.0)
Hemoglobin: 13 g/dL (ref 13.0–17.0)
MCH: 28.8 pg (ref 26.0–34.0)
MCHC: 32.3 g/dL (ref 30.0–36.0)
MCV: 89.4 fL (ref 78.0–100.0)
PLATELETS: 322 10*3/uL (ref 150–400)
RBC: 4.51 MIL/uL (ref 4.22–5.81)
RDW: 12.9 % (ref 11.5–15.5)
WBC: 7.5 10*3/uL (ref 4.0–10.5)

## 2015-01-26 LAB — ETHANOL: Alcohol, Ethyl (B): <5 mg/dL (ref 0–9)

## 2015-01-26 LAB — ACETAMINOPHEN LEVEL: Acetaminophen (Tylenol), Serum: <10 ug/mL — ABNORMAL LOW (ref 10–30)

## 2015-01-26 LAB — SALICYLATE LEVEL: Salicylate Lvl: <4 mg/dL (ref 2.8–20.0)

## 2015-01-26 NOTE — ED Notes (Signed)
Pt states he is having trouble with his family and that he came in before he did something bad. Denies SI. States he was here recently for same.

## 2015-01-26 NOTE — Discharge Instructions (Signed)
°Emergency Department Resource Guide °1) Find a Doctor and Pay Out of Pocket °Although you won't have to find out who is covered by your insurance plan, it is a good idea to ask around and get recommendations. You will then need to call the office and see if the doctor you have chosen will accept you as a new patient and what types of options they offer for patients who are self-pay. Some doctors offer discounts or will set up payment plans for their patients who do not have insurance, but you will need to ask so you aren't surprised when you get to your appointment. ° °2) Contact Your Local Health Department °Not all health departments have doctors that can see patients for sick visits, but many do, so it is worth a call to see if yours does. If you don't know where your local health department is, you can check in your phone book. The CDC also has a tool to help you locate your state's health department, and many state websites also have listings of all of their local health departments. ° °3) Find a Walk-in Clinic °If your illness is not likely to be very severe or complicated, you may want to try a walk in clinic. These are popping up all over the country in pharmacies, drugstores, and shopping centers. They're usually staffed by nurse practitioners or physician assistants that have been trained to treat common illnesses and complaints. They're usually fairly quick and inexpensive. However, if you have serious medical issues or chronic medical problems, these are probably not your best option. ° °No Primary Care Doctor: °- Call [facility] at  832-8000 - they can help you locate a primary care doctor that  accepts your insurance, provides certain services, etc. °- Physician Referral Service- 1-800-533-3463 ° °Chronic Pain Problems: °Organization         Address  Phone   Notes  °[facility]  (336) 297-2271 Patients need to be referred by their primary care doctor.  ° °Medication  Assistance: °Organization         Address  Phone   Notes  °[place] [facility] [address] °[place], [place] [address] (336) 641-8030 --Must be a resident of [place] °-- Must have NO insurance coverage whatsoever (no Medicaid/ Medicare, etc.) °-- The pt. MUST have a primary care doctor that directs their care regularly and follows them in the community °  °[facility]  (866) 331-1348   °[employer]  (888) 892-1162   ° °Agencies that provide inexpensive medical care: °Organization         Address  Phone   Notes  °[facility]  (336) 832-8035   °[facility]    (336) 832-7272   °[facility] [address] °[place], [place] [address] (336) 832-4777   °[facility] [address], °[place] (336) 271-4999   °[facility]    (336) 373-0678   °[facility]    (336) 272-1050   °[facility] ° [address], [place] Phone:  (336) 832-4444, Fax:  (336) 832-4440 Hours of Operation:  9 am - 6 pm, M-F.  Also accepts Medicaid/Medicare and self-pay.  °[facility] ° [address], [place] Phone: (336) 832-3150, Fax: (336) 832-3151. Hours of Operation:  8:30 am - 5:30 pm, M-F.  Also accepts Medicaid and self-pay.  °[facility] [place] [address], [place] Phone: (336) 878-6027   °[facility] [address], [place], [place] (336)723-1848, Ext. 123 Mondays & Thursdays: 7-9 AM.  First 15 patients are seen on a first come, first serve basis. °  ° °Medicaid-accepting [place] Providers: ° °Organization         Address  Phone   Notes  °[facility] [address], [place] (336) 641-2100 Also accepts self-pay patients.  °[facility] [address], [place] ° (336) 856-9996   °[facility] [address], [place]  (336) 288-8857   °[facility] [address] [place] [address], [place] (336) 299-7000   °Veita Bland [address], [place]  ° (336) 373-1557 Only accepts  Access Medicaid patients after they have their name applied to their card.  ° °Self-Pay (no insurance) in [place]: ° °Organization         Address  Phone   Notes  °Sickle Cell Patients, [facility] [address], [place] (336) 832-1970   °[facility] [address], [place] (336) 832-4400   °[facility] ° [address], [place] (336) 992-4800   °[facility]/Dr. Osei-Bonsu ° [address] [place] [address], [place] or [address], [place] (336) 841-8500 Phone number for both [place] and [place] locations is the same.  °[facility] [address], [place] (336) 299-0000   °[facility] [address] [place] [address], [place] or [address] (336) 852-7530 °(336) 878-2260   °[facility] [address], [place] (336) 350-1642, phone; (336) 294-5005, fax Sees patients 1st and 3rd Saturday of every month.  Must not qualify for public or private insurance (i.e. Medicaid, Medicare, [place] Health Choice, Veterans' Benefits) • Household income should be no more than 200% of the poverty level •The clinic cannot treat you if you are pregnant or think you are pregnant • Sexually transmitted diseases are not treated at the clinic.  ° ° °Dental Care: °Organization         Address  Phone  Notes  °[place] [facility] [address], [place] (336) 641-6152 Accepts children up to age 21 who are enrolled in Medicaid or [place] Health Choice; pregnant women with a Medicaid card; and children who have applied for Medicaid or [place] Health Choice, but were declined, whose parents can pay a reduced fee at time of service.  °[place]  [facility] [place]  [address], [place] (336) 641-7733 Accepts children up to age 21 who are enrolled in Medicaid or [place] Health Choice; pregnant women with a Medicaid card; and children who have applied for Medicaid or [place] Health Choice, but were declined, whose parents can pay a reduced fee at time of service.  °[facility] ° [address],  (336) 641-4533 Patients are seen by appointment only. Walk-ins are not accepted. [facility] will see patients 18 years of age and older. °Monday - Tuesday (8am-5pm) °Most Wednesdays (8:30-5pm) °$30 per visit, cash only  °[facility] ° [address], [place] (336) 641-4533 Patients are seen by appointment only. Walk-ins are not accepted. [facility] will see patients 18 years of age and older. °One   Wednesday Evening (Monthly: Volunteer Based).  $30 per visit, cash only  °[facility]  (919) 537-3737 for adults; Children under age 4, call Graduate Pediatric Dentistry at (919) [phone]. Children aged 4-14, please call (919) [phone] to request a pediatric application. ° Dental services are provided in all areas of dental care including fillings, crowns and bridges, complete and partial dentures, implants, gum treatment, root canals, and extractions. Preventive care is also provided. Treatment is provided to both adults and children. °Patients are selected via a lottery and there is often a waiting list. °  °[facility] [address], °[place] ° (336) 763-8833 [email or web address] °  °[facility] [address], [place], [place] (336)723-1848, Ext. 123 Second and Fourth Thursday of each month, opens at 6:30 AM; Clinic ends at 9 AM.  Patients are seen on a first-come first-served basis, and a limited number are seen during each clinic.  ° °[facility] ° [address], [place], [place] (336) 723-7904    Eligibility Requirements °You must have lived in [place], [place], or [place] counties for at least the last three months. °  You cannot be eligible for state or federal sponsored healthcare insurance, including Veterans Administration, Medicaid, or Medicare. °  You generally cannot be eligible for healthcare insurance through your employer.  °  How to apply: °Eligibility screenings are held every Tuesday and Wednesday afternoon from 1:00 pm until 4:00 pm. You do not need an appointment for the interview!  °[facility] [address], [place], [place] 336-631-2330   °[facility]  336-342-8273   °[facility]  336-703-3100   °[facility]  336-570-6415   ° °Behavioral Health Resources in the Community: °Intensive Outpatient Programs °Organization         Address  Phone  Notes  °[facility] [address], [place], [place] 336-878-6098   °[facility] [address], [place], [place] 336-832-9800   °[facility] [address], [place], [place] ° 336-882-2125   °[facility] [address],  °[place], [place] 1-800-853-5163 or 336-641-4981   °Substance Abuse Resources °Organization         Address  Phone  Notes  °[facility]  336-882-2125   °[facility]  336-784-9470   °[facility]  336-285-9073   °[facility]  336-845-3988   °Residential & Outpatient Substance Abuse Program  1-800-659-3381   °Psychological Services °Organization         Address  Phone  Notes  °[facility]  336- 832-9600   °[facility]  336- 378-7881   °[facility] [address], [place] 1-800-853-5163 or 336-641-4981   ° °Mobile Crisis Teams °Organization         Address  Phone  Notes  °[facility], Mobile Crisis Care Unit  1-877-626-1772   °Assertive °[facility] ° [address]  [place], [place] 336-834-9664   °Sharon DeEsch [address] °[place] 336-554-5454   ° °Self-Help/Support Groups °Organization         Address  Phone             Notes  °[facility] - variety of support groups  336- 373-1402 Call for more information  °Narcotics Anonymous (NA), [facility] [address], °[place]   2 meetings at this location  ° °  Residential Treatment Programs °Organization         Address  Phone  Notes  °[facility] [address],    °[place]  1-866-801-8205   °[facility] ° [address], [place], [place] 704-293-8524   °[facility] [address], [place] 336-845-3988 Admissions: 8am-3pm M-F  °[facility] [address]    °[place], [place] 336-841-1104   °[facility] [address], [place], [place] 336-379-7146   °[facility] [address]  °[place], [place] 336-285-9073   °[facility] Intensive Outpatient [address], [place], [place] 336-852-3033   °[facility] [address]  °[place], [place] 1-877-615-2722 or 336-784-9470   °[facility] [address] [place], [place] 336-227-7417 Accepts Medicaid  °[facility] [address]  °[place] 1-800-659-3381 Substance Abuse/Addiction Treatment  ° °[place] Behavioral Health Resources °Organization         Address  Phone  Notes  °[facility]  (888) 581-9988   °Julie Brannon, PhD [address] [place], [place]   (336) 349-5553 or (336) 951-0000   °[facility]   [address] °[place], [place] (336) 349-4454   °[facility] [address], [place], [place] (336) 342-8316 Insurance/Medicaid/sponsorship through [facility]  °[facility] [address]                                    [place], [place] (336) 342-8316 Therapy/tele-psych/case    °[facility] [address]  ° [place], [place] (336) 349-2233    °Dr. Arfeen  (336) 349-4544   °[facility] [place]  [employer] [place] [facility] 1) [address],  °2) [address], [place] °3)  [address], [place] (336) 349-3220 °(336) 342-7768 ° °(336) 342-8140   °[place] [facility] (336) 342-1394 or (336) 342-3537 (After Hours)    ° ° °

## 2015-01-26 NOTE — ED Provider Notes (Signed)
CSN: [id number]     Arrival date & time 01/26/15  0136 History   First MD Initiated Contact with Patient 01/26/15 [phone]     Chief Complaint  Patient presents with  . Homicidal     (Consider location/radiation/quality/duration/timing/severity/associated sxs/prior Treatment) HPI Comments: He states he wanted to sleep and could not sleep at home due to his family. He currently denies any SI/HI, hallucinations. No physical complaints.   The history is provided by the patient. No language interpreter was used.    Past Medical History  Diagnosis Date  . Bipolar affective disorder   . Schizophrenia   . ADHD (attention deficit hyperactivity disorder)    History reviewed. No pertinent past surgical history. Family History  Problem Relation Age of Onset  . Hypertension Mother    History  Substance Use Topics  . Smoking status: Current Every Day Smoker -- 1.00 packs/day for 6 years  . Smokeless tobacco: Not on file  . Alcohol Use: 0.6 oz/week    1 Cans of beer per week    Review of Systems  Constitutional: Negative for fever and chills.  HENT: Negative.   Respiratory: Negative.   Cardiovascular: Negative.   Gastrointestinal: Negative.   Musculoskeletal: Negative.   Skin: Negative.   Neurological: Negative.   Psychiatric/Behavioral:       See HPI.      Allergies  Shrimp  Home Medications   Prior to Admission medications   Medication Sig Start Date End Date Taking? Authorizing Provider  amitriptyline (ELAVIL) 25 MG tablet Take 1 tablet (25 mg total) by mouth at bedtime. 09/10/14  Yes Adonis Brook, NP  amphetamine-dextroamphetamine (ADDERALL) 20 MG tablet Take 1 tablet (20 mg total) by mouth daily. 01/20/15  Yes Earney Navy, NP  benztropine (COGENTIN) 1 MG tablet Take 1 tablet (1 mg total) by mouth 2 (two) times daily. 09/10/14  Yes Adonis Brook, NP  hydrOXYzine (ATARAX/VISTARIL) 25 MG tablet Take 1 tablet (25 mg total) by mouth every 6 (six) hours as needed for  anxiety. 09/10/14  Yes Adonis Brook, NP  lithium carbonate 300 MG capsule Take 1 capsule (300 mg total) by mouth 2 (two) times daily with a meal. 01/20/15  Yes Earney Navy, NP  OLANZapine zydis (ZYPREXA) 20 MG disintegrating tablet Take 1 tablet (20 mg total) by mouth at bedtime. 01/20/15  Yes Earney Navy, NP  amitriptyline (ELAVIL) 25 MG tablet Take 1 tablet (25 mg total) by mouth at bedtime. Patient not taking: Reported on 01/26/2015 01/20/15   Earney Navy, NP  benztropine (COGENTIN) 1 MG tablet Take 1 tablet (1 mg total) by mouth 2 (two) times daily. Patient not taking: Reported on 01/26/2015 01/20/15   Earney Navy, NP   BP 119/102 mmHg  Pulse 91  Temp(Src) 97.6 F (36.4 C) (Oral)  SpO2 99% Physical Exam  Constitutional: He is oriented to person, place, and time. He appears well-developed and well-nourished. No distress.  HENT:  Head: Normocephalic.  Neck: Normal range of motion.  Cardiovascular: Normal rate.   Pulmonary/Chest: Effort normal.  Musculoskeletal: Normal range of motion.  Neurological: He is alert and oriented to person, place, and time.  Skin: Skin is dry.  Psychiatric: He has a normal mood and affect.    ED Course  Procedures (including critical care time) Labs Review Labs Reviewed  ACETAMINOPHEN LEVEL - Abnormal; Notable for the following:    Acetaminophen (Tylenol), Serum <10.0 (*)    All other components within normal limits  URINE RAPID  DRUG SCREEN (HOSP PERFORMED) - Abnormal; Notable for the following:    Tetrahydrocannabinol POSITIVE (*)    All other components within normal limits  CBC  COMPREHENSIVE METABOLIC PANEL  ETHANOL  SALICYLATE LEVEL    Imaging Review No results found.   EKG Interpretation None      MDM   Final diagnoses:  None    1. Agitation  He denies current SI/HI or hallucinations. He states he just wants to sleep. Will reassess later morning, check with family to arrange discharge.      Elpidio AnisShari  Zaara Sprowl, PA-C 01/26/15 [id number]  Paula LibraJohn Molpus, MD 01/26/15 [phone]

## 2015-01-26 NOTE — ED Notes (Signed)
4 pt belonging bags and one black book bag placed in [facility].

## 2015-01-26 NOTE — ED Notes (Signed)
TTS assessing patient by telepsych

## 2015-01-26 NOTE — BH Assessment (Addendum)
Tele Assessment Note   Philip [name] is an 24 y.o. male that was assessed this day via tele assessment.  Pt presents to ED reporting he cannot get any sleep and that he needs his medications.  He stated he dropped his prescription.  Pt stated he did not yet follow up with [facility].  Pt denies SI, HI or AVH at this time.  No delusions noted.  Pt recently seen in ED for agitation, under IVC by his sister, found wandering taking off clothing, and arguing with strangers and family.  Pt reports hx of marijuana use, but denies currently.  Pt calm, cooperative, with euthymic mood, logical/coherent thought processes, good eye contact, normal speech, and in scrubs.  Consulted with EDP Wickline at 0845 and he asked that writer call family based on pt's hx to determine if pt could be discharged from ED, as he denies SI, HI, or psychosis.  Called pt's sister, [phone]-with pt's permission per pt's nurse, Lelon MastSamantha, and she stated at [phone] that she and her mother were fine and not in any danger from the pt at this time.  Per sister, he does get agitated when not taking meds consistently.  She stated she did not know he was in ED and that she would inform his mother and pick pt up from ED.  Updated EDP Wickline who stated the pt could be discharged and follow up with [facility] at 0900.  Updated [facility] and TTS.  Axis I: 296.7 Bipolar I disorder, Current or most recent episode unspecified, 295.90 Schizophrenia, 314.01 Attention-deficit/hyperactivity disorder, Combined presentation   Axis II: Deferred Axis III:  Past Medical History  Diagnosis Date  . Bipolar affective disorder   . Schizophrenia   . ADHD (attention deficit hyperactivity disorder)    Axis IV: housing problems and other psychosocial or environmental problems Axis V: 41-50 serious symptoms  Past Medical History:  Past Medical History  Diagnosis Date  . Bipolar affective disorder   . Schizophrenia   . ADHD (attention deficit hyperactivity disorder)      History reviewed. No pertinent past surgical history.  Family History:  Family History  Problem Relation Age of Onset  . Hypertension Mother     Social History:  reports that he has been smoking.  He does not have any smokeless tobacco history on file. He reports that he drinks about 0.6 oz of alcohol per week. He reports that he uses illicit drugs (Marijuana).  Additional Social History:  Alcohol / Drug Use Pain Medications: SEE MAR Prescriptions: SEE MAR Over the Counter: SEE MAR History of alcohol / drug use?: Yes Longest period of sobriety (when/how long): unknown Negative Consequences of Use:  (pt denies) Withdrawal Symptoms:  (pt denies) Substance #1 Name of Substance 1: Patient has a hx of THC use. Patient however does not provide details of THC use  1 - Age of First Use: unk 1 - Amount (size/oz): unk 1 - Frequency: unk 1 - Duration: unk 1 - Last Use / Amount: unk Substance #2 Name of Substance 2: Patient has a hx of Barbituates use. Patient however does not provide details of Barbituates use  2 - Age of First Use: unk 2 - Amount (size/oz): unk 2 - Frequency: unk 2 - Duration: unk 2 - Last Use / Amount: unk  CIWA: CIWA-Ar BP: 133/96 mmHg Pulse Rate: 63 COWS:    PATIENT STRENGTHS: (choose at least two) Average or above average intelligence General fund of knowledge  Allergies:  Allergies  Allergen Reactions  .  Shrimp [Shellfish Allergy] Anaphylaxis    Home Medications:  (Not in a hospital admission)  OB/GYN Status:  No LMP for male patient.  General Assessment Data Location of Assessment: [facility] Is this a Tele or Face-to-Face Assessment?: Tele Assessment Is this an Initial Assessment or a Re-assessment for this encounter?: Initial Assessment Living Arrangements: Other (Comment), Non-relatives/Friends (pt stated he lives with friends) Can pt return to current living arrangement?: Yes Admission Status: Voluntary Is patient capable of signing  voluntary admission?: Yes Transfer from: Acute Hospital Referral Source: Self/Family/Friend     [employer] [facility] [employer] Crisis Care Plan Living Arrangements: Other (Comment), Non-relatives/Friends (pt stated he lives with friends) Name of Psychiatrist: none Name of Therapist: none  Education Status Is patient currently in school?: No  Risk to self with the past 6 months Suicidal Ideation: No Suicidal Intent: No Is patient at risk for suicide?: No Suicidal Plan?: No Access to Means: No Specify Access to Suicidal Means: na What has been your use of drugs/alcohol within the last 12 months?: pt has hx of marijuana and barbituate use, currently denies Previous Attempts/Gestures: No How many times?: 0 Other Self Harm Risks: na - pt denies Triggers for Past Attempts: None known Intentional Self Injurious Behavior: None Family Suicide History: No Recent stressful life event(s): Other (Comment) (off of meds) Persecutory voices/beliefs?: No Depression: Yes Depression Symptoms: Feeling angry/irritable Substance abuse history and/or treatment for substance abuse?: No Suicide prevention information given to non-admitted patients: Not applicable  Risk to Others within the past 6 months Homicidal Ideation: No Thoughts of Harm to Others: No Current Homicidal Intent: No Current Homicidal Plan: No Access to Homicidal Means: No Identified Victim: na - pt denies History of harm to others?: No Assessment of Violence: None Noted Violent Behavior Description: na - pt denies Does patient have access to weapons?: No Criminal Charges Pending?: No Does patient have a court date: No  Psychosis Hallucinations: None noted Delusions: None noted  Mental Status Report Appearance/Hygiene: In scrubs Eye Contact: Good Motor Activity: Freedom of movement, Unremarkable Speech: Logical/coherent Level of Consciousness: Alert Mood: Euthymic Affect: Appropriate to circumstance Anxiety Level: None Thought Processes:  Coherent, Relevant Judgement: Unimpaired Orientation: Person, Place, Situation, Time Obsessive Compulsive Thoughts/Behaviors: None  Cognitive Functioning Concentration: Normal Memory: Recent Intact, Remote Intact IQ: Average Insight: Fair Impulse Control: Fair Appetite: Good Weight Loss: 0 Weight Gain: 0 Total Hours of Sleep:  (pt reports he has not been sleeping) Vegetative Symptoms: None  ADLScreening [facility] Assessment Services) Patient's cognitive ability adequate to safely complete daily activities?: Yes Patient able to express need for assistance with ADLs?: Yes Independently performs ADLs?: Yes (appropriate for developmental age)  Prior Inpatient Therapy Prior Inpatient Therapy: Yes Prior Therapy Dates: pt unable to recall dates Prior Therapy Facilty/Provider(s): [facility] Reason for Treatment: psychosis  Prior Outpatient Therapy Prior Outpatient Therapy: Yes Prior Therapy Dates: pt to follow up with [facility] and has seen in past Prior Therapy Facilty/Provider(s): [facility] Reason for Treatment: Med mgnt  ADL Screening (condition at time of admission) Patient's cognitive ability adequate to safely complete daily activities?: Yes Is the patient deaf or have difficulty hearing?: No Does the patient have difficulty seeing, even when wearing glasses/contacts?: No Does the patient have difficulty concentrating, remembering, or making decisions?: No Patient able to express need for assistance with ADLs?: Yes Does the patient have difficulty dressing or bathing?: No Independently performs ADLs?: Yes (appropriate for developmental age) Does the patient have difficulty walking or climbing stairs?: No  Home Assistive Devices/Equipment Home Assistive  Devices/Equipment: None    Abuse/Neglect Assessment (Assessment to be complete while patient is alone) Physical Abuse: Denies Verbal Abuse: Denies Sexual Abuse: Denies Exploitation of patient/patient's resources: Denies Self-Neglect:  Denies Values / Beliefs Cultural Requests During Hospitalization: None Spiritual Requests During Hospitalization: None Consults Spiritual Care Consult Needed: No Social Work Consult Needed: No Merchant navy officer (For Healthcare) Does patient have an advance directive?: No Would patient like information on creating an advanced directive?: No - patient declined information    Additional Information 1:1 In Past 12 Months?: No CIRT Risk: No Elopement Risk: No Does patient have medical clearance?: Yes     Disposition:  Disposition Initial Assessment Completed for this Encounter: Yes Disposition of Patient: Other dispositions Other disposition(s): Other (Comment) (Pending disposition)  Casimer Lanius, MS, [facility] Therapeutic Triage Specialist [facility]   01/26/2015 8:34 AM

## 2015-01-26 NOTE — ED Notes (Signed)
Pt states he is here because he wanted to get some sleep. Pt states his family was bothering him and he wanted to get away from them. Pt denies any homicidal ideation against his family at this time.

## 2015-01-26 NOTE — ED Notes (Signed)
Pt belongings wanded by security.  

## 2015-01-26 NOTE — ED Provider Notes (Signed)
Pt seen by TTS Pt denies SI/HI TTS has spoken to sister (pt gave permission to speak to her) and his behavior has been appropriate Pt appropriate for d/c home BP 133/96 mmHg  Pulse 63  Temp(Src) 97.6 F (36.4 C) (Oral)  Resp 16  SpO2 100%   Zadie Rhineonald Emeline Simpson, MD 01/26/15 0900

## 2015-02-05 ENCOUNTER — Encounter (HOSPITAL_COMMUNITY): Payer: Self-pay | Admitting: Emergency Medicine

## 2015-02-05 ENCOUNTER — Emergency Department (HOSPITAL_COMMUNITY)
Admission: EM | Admit: 2015-02-05 | Discharge: 2015-02-05 | Disposition: A | Discharge status: Home / Self Care | Payer: Federal, State, Local not specified - Other | Attending: Emergency Medicine | Admitting: Emergency Medicine

## 2015-02-05 DIAGNOSIS — Z720 Tobacco use: Secondary | ICD-10-CM | POA: Insufficient documentation

## 2015-02-05 DIAGNOSIS — F329 Major depressive disorder, single episode, unspecified: Secondary | ICD-10-CM | POA: Insufficient documentation

## 2015-02-05 DIAGNOSIS — F32A Depression, unspecified: Secondary | ICD-10-CM

## 2015-02-05 DIAGNOSIS — F250 Schizoaffective disorder, bipolar type: Secondary | ICD-10-CM | POA: Insufficient documentation

## 2015-02-05 DIAGNOSIS — Z79899 Other long term (current) drug therapy: Secondary | ICD-10-CM | POA: Insufficient documentation

## 2015-02-05 DIAGNOSIS — F909 Attention-deficit hyperactivity disorder, unspecified type: Secondary | ICD-10-CM | POA: Insufficient documentation

## 2015-02-05 LAB — COMPREHENSIVE METABOLIC PANEL
ALBUMIN: 4.1 g/dL (ref 3.5–5.2)
ALT: 21 U/L (ref 0–53)
AST: 28 U/L (ref 0–37)
Alkaline Phosphatase: 76 U/L (ref 39–117)
Anion gap: 9 (ref 5–15)
BILIRUBIN TOTAL: 0.9 mg/dL (ref 0.3–1.2)
BUN: 9 mg/dL (ref 6–23)
CO2: 24 mmol/L (ref 19–32)
Calcium: 9 mg/dL (ref 8.4–10.5)
Chloride: 105 mmol/L (ref 96–112)
Creatinine, Ser: 0.87 mg/dL (ref 0.50–1.35)
GFR calc Af Amer: >90 mL/min (ref >90)
GLUCOSE: 88 mg/dL (ref 70–99)
Potassium: 3.3 mmol/L — ABNORMAL LOW (ref 3.5–5.1)
SODIUM: 138 mmol/L (ref 135–145)
Total Protein: 7 g/dL (ref 6.0–8.3)

## 2015-02-05 LAB — RAPID URINE DRUG SCREEN, HOSP PERFORMED
Amphetamines: NOT DETECTED
BARBITURATES: NOT DETECTED
Benzodiazepines: NOT DETECTED
COCAINE: NOT DETECTED
OPIATES: NOT DETECTED
Tetrahydrocannabinol: POSITIVE — AB

## 2015-02-05 LAB — CBC
HCT: 40.1 % (ref 39.0–52.0)
HEMOGLOBIN: 12.9 g/dL — AB (ref 13.0–17.0)
MCH: 28.2 pg (ref 26.0–34.0)
MCHC: 32.2 g/dL (ref 30.0–36.0)
MCV: 87.7 fL (ref 78.0–100.0)
Platelets: 293 10*3/uL (ref 150–400)
RBC: 4.57 MIL/uL (ref 4.22–5.81)
RDW: 13.1 % (ref 11.5–15.5)
WBC: 8 10*3/uL (ref 4.0–10.5)

## 2015-02-05 LAB — ACETAMINOPHEN LEVEL: Acetaminophen (Tylenol), Serum: <10 ug/mL — ABNORMAL LOW (ref 10–30)

## 2015-02-05 LAB — ETHANOL: Alcohol, Ethyl (B): <5 mg/dL (ref 0–9)

## 2015-02-05 LAB — SALICYLATE LEVEL: Salicylate Lvl: <4 mg/dL (ref 2.8–20.0)

## 2015-02-05 MED ORDER — IBUPROFEN 200 MG PO TABS: 600.0000 mg | ORAL_TABLET | Freq: Three times a day (TID) | ORAL | Status: DC | PRN

## 2015-02-05 MED ORDER — ONDANSETRON HCL 4 MG PO TABS: 4.0000 mg | ORAL_TABLET | Freq: Three times a day (TID) | ORAL | Status: DC | PRN

## 2015-02-05 MED ORDER — NICOTINE 21 MG/24HR TD PT24: 21.0000 mg | MEDICATED_PATCH | Freq: Every day | TRANSDERMAL | Status: DC
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
## 2015-02-05 NOTE — ED Notes (Signed)
Pt given a sandwich and drink.

## 2015-02-05 NOTE — BH Assessment (Addendum)
Tele Assessment Note   Philip [name] is an 24 y.o. male. Pt arrived [facility] voluntarily. Pt denies SI/HI. Pt reports auditory hallucinations. According to the Pt,  "I hear my homeboys talking to me." Pt states that he came to [facility] because he is depressed that all of his friends are dying. The Pt states that he feels that he needs to be in the hospital to calm down and get relax. According to the Pt, he feels that he needs to be in the hospital for 2 weeks. Pt has been hospitalized at [facility] previously for psychosis. Pt was last hospitalized in March 2016. Pt receives medication management at [facility]. Pt states that he is currently prescribed Vistaril, Adderall, and Trazodone. Pt reports occasional alcohol and marijuana use. Pt states that alcohol takes his troubles away.   Pt is pending am psych evaluation.   Axis I: ADHD, combined type and Bipolar, Depressed, Schizophrenia Axis II: Deferred Axis III:  Past Medical History  Diagnosis Date  . Bipolar affective disorder   . Schizophrenia   . ADHD (attention deficit hyperactivity disorder)    Axis IV: housing problems, other psychosocial or environmental problems, problems related to social environment and problems with primary support group Axis V: 31-40 impairment in reality testing  Past Medical History:  Past Medical History  Diagnosis Date  . Bipolar affective disorder   . Schizophrenia   . ADHD (attention deficit hyperactivity disorder)     History reviewed. No pertinent past surgical history.  Family History:  Family History  Problem Relation Age of Onset  . Hypertension Mother     Social History:  reports that he has been smoking.  He does not have any smokeless tobacco history on file. He reports that he drinks about 0.6 oz of alcohol per week. He reports that he uses illicit drugs (Marijuana).  Additional Social History:  Alcohol / Drug Use Pain Medications: Pt denies Prescriptions: Visiril, Trazodone, Adderrall, Xanax Over  the Counter: Pt denies History of alcohol / drug use?: Yes Longest period of sobriety (when/how long): Pt could not recall Substance #1 Name of Substance 1: Alcohol 1 - Age of First Use: 18 1 - Amount (size/oz): Pt states unknown 1 - Frequency: occasional 1 - Duration: ongoing 1 - Last Use / Amount: 02/05/15 Substance #2 Name of Substance 2: Marijuana 2 - Age of First Use: 13 2 - Amount (size/oz): 1 blunt 2 - Frequency: occasional 2 - Duration: ongoing 2 - Last Use / Amount: 02/05/15  CIWA: CIWA-Ar BP: 132/59 mmHg Pulse Rate: 78 COWS:    PATIENT STRENGTHS: (choose at least two) Active sense of humor Communication skills  Allergies:  Allergies  Allergen Reactions  . Shrimp [Shellfish Allergy] Anaphylaxis    Home Medications:  (Not in a hospital admission)  OB/GYN Status:  No LMP for male patient.  General Assessment Data [facility] of [facility]: [facility] Is this a Tele or Face-to-Face Assessment?: Tele Assessment Is this an Initial Assessment or a Re-assessment for this encounter?: Initial Assessment Living Arrangements: Other (Comment), Non-relatives/Friends Can pt return to current living arrangement?: Yes Admission Status: Voluntary Is patient capable of signing voluntary admission?: Yes Transfer from: Home Referral Source: Self/Family/Friend     [facility] Crisis Care Plan Living Arrangements: Other (Comment), Non-relatives/Friends Name of Psychiatrist: Vesta Mixer Name of Therapist: [facility]  Education Status Is patient currently in school?: No Current Grade: NA Highest grade of school patient has completed: GED Name of school: NA Contact person: NA  Risk to self with  the past 6 months Suicidal Ideation: No Suicidal Intent: No Is patient at risk for suicide?: No Suicidal Plan?: No Access to Means: No Specify Access to Suicidal Means: NA What has been your use of drugs/alcohol within the last 12 months?: Pt reports alcohol and marijuana use Previous  Attempts/Gestures: No How many times?: 0 Other Self Harm Risks: NA Triggers for Past Attempts: None known Intentional Self Injurious Behavior: None Family Suicide History: No Recent stressful life event(s): Trauma (Comment) (Pt reports his friend's continue to pass away) Persecutory voices/beliefs?: No Depression: Yes Depression Symptoms: Loss of interest in usual pleasures, Feeling worthless/self pity, Feeling angry/irritable, Guilt, Fatigue, Tearfulness Substance abuse history and/or treatment for substance abuse?: Yes Suicide prevention information given to non-admitted patients: Not applicable  Risk to Others within the past 6 months Homicidal Ideation: No Thoughts of Harm to Others: No Current Homicidal Intent: No Current Homicidal Plan: No Access to Homicidal Means: No Identified Victim: NA History of harm to others?: No Assessment of Violence: None Noted Violent Behavior Description: NA Does patient have access to weapons?: No Criminal Charges Pending?: No Does patient have a court date: No  Psychosis Hallucinations: Auditory Delusions: None noted  Mental Status Report Appearance/Hygiene: In scrubs Eye Contact: Fair Motor Activity: Freedom of movement Speech: Logical/coherent Level of Consciousness: Alert Mood: Depressed, Sad Affect: Appropriate to circumstance Anxiety Level: Minimal Thought Processes: Coherent, Relevant Judgement: Impaired Orientation: Person, Place, Situation, Time Obsessive Compulsive Thoughts/Behaviors: None  Cognitive Functioning Concentration: Normal Memory: Recent Intact, Remote Intact IQ: Average Insight: Fair Impulse Control: Fair Appetite: Fair Weight Loss: 0 Weight Gain: 0 Sleep: Decreased Total Hours of Sleep: 6 Vegetative Symptoms: None  ADLScreening [facility] Assessment Services) Patient's cognitive ability adequate to safely complete daily activities?: Yes Patient able to express need for assistance with ADLs?:  Yes Independently performs ADLs?: Yes (appropriate for developmental age)  Prior Inpatient Therapy Prior Inpatient Therapy: Yes Prior Therapy Dates: 12/2014 [facility] Reason for Treatment: psychosis  Prior Outpatient Therapy Prior Outpatient Therapy: Yes Prior Therapy Dates: 2016 Prior Therapy Facilty/Provider(s): [facility] Reason for Treatment: Med mgnt  ADL Screening (condition at time of admission) Patient's cognitive ability adequate to safely complete daily activities?: Yes Is the patient deaf or have difficulty hearing?: No Does the patient have difficulty seeing, even when wearing glasses/contacts?: No Does the patient have difficulty concentrating, remembering, or making decisions?: No Patient able to express need for assistance with ADLs?: Yes Does the patient have difficulty dressing or bathing?: No Independently performs ADLs?: Yes (appropriate for developmental age) Does the patient have difficulty walking or climbing stairs?: No Weakness of Legs: None Weakness of Arms/Hands: None       Abuse/Neglect Assessment (Assessment to be complete while patient is alone) Physical Abuse: Denies Verbal Abuse: Denies Sexual Abuse: Denies Exploitation of patient/patient's resources: Denies Self-Neglect: Denies     Merchant navy officerAdvance Directives (For Healthcare) Does patient have an advance directive?: No Would patient like information on creating an advanced directive?: No - patient declined information    Additional Information 1:1 In Past 12 Months?: No CIRT Risk: No Elopement Risk: No Does patient have medical clearance?: Yes     Disposition:  Disposition Initial Assessment Completed for this Encounter: Yes Disposition of Patient: Other dispositions Other disposition(s): Other (Comment) (Pt pending am psych evaluation)  Lashona Schaaf D 02/05/2015 7:49 AM
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
## 2015-02-05 NOTE — ED Notes (Signed)
PA at bedside.

## 2015-02-05 NOTE — ED Notes (Signed)
Patient arrived to unit and was bright on approach. Pt's affect incongruent with reported mood. Pt states "I just needed to chill". Pt denies SI/HI or plans to harm himself at this time. Pt provided with drinks and sandwich on request. Pt stating he wanted to watch t.v. No s/s of distress noted at this time.

## 2015-02-05 NOTE — [facility] Suicide Risk Assessment (Signed)
Suicide Risk Assessment  Discharge Assessment   [facility]   Demographic Factors:  Male and Adolescent or young adult  Total Time spent with patient: 30 minutes  Musculoskeletal: Strength & Muscle Tone: within normal limits Gait & Station: normal Patient leans: N/A  Psychiatric Specialty Exam:     Blood pressure 126/86, pulse 80, temperature 98.7 F (37.1 C), temperature source Oral, resp. rate 16, SpO2 100 %.There is no weight on file to calculate BMI.  General Appearance: Casual  Eye Contact::  Good  Speech:  Normal Rate  Volume:  Normal  Mood:  Euthymic  Affect:  Congruent  Thought Process:  Coherent  Orientation:  Full (Time, Place, and Person)  Thought Content:  WDL  Suicidal Thoughts:  No  Homicidal Thoughts:  No  Memory:  Immediate;   Good Recent;   Good Remote;   Good  Judgement:  Fair  Insight:  Fair  Psychomotor Activity:  Normal  Concentration:  Good  Recall:  Good  Fund of Knowledge:Good  Language: Good  Akathisia:  No  Handed:  Right  AIMS (if indicated):     Assets:  Housing Leisure Time Physical Health Resilience Social Support  ADL's:  Intact  Cognition: WNL  Sleep:         Has this patient used any form of tobacco in the last 30 days? (Cigarettes, Smokeless Tobacco, Cigars, and/or Pipes) Yes, A prescription for an FDA-approved tobacco cessation medication was offered at discharge and the patient refused  Mental Status Per Nursing Assessment::   On Admission:   Depression, medication changes  Current Mental Status by Physician: NA  Loss Factors: NA  Historical Factors: Impulsivity  Risk Reduction Factors:   Sense of responsibility to family, Living with another person, especially a relative and Positive social support  Continued Clinical Symptoms:  None  Cognitive Features That Contribute To Risk:  None    Suicide Risk:  Minimal: No identifiable suicidal ideation.  Patients presenting with no risk  factors but with morbid ruminations; may be classified as minimal risk based on the severity of the depressive symptoms  Principal Problem: Schizoaffective disorder, bipolar type Discharge Diagnoses:  Patient Active Problem List   Diagnosis Date Noted  . Schizoaffective disorder, bipolar type [F25.0]     Priority: High  . Agitation [R45.1] 09/16/2014  . Polysubstance abuse [F19.10] 09/16/2014  . Substance induced mood disorder [F19.94] 09/16/2014  . Cannabis use disorder, severe, dependence [F12.20]   . Hyperammonemia [E72.20] 09/07/2014  . Psychotic disorder [F29] 09/01/2014  . Bipolar affective disorder, current episode manic with psychotic symptoms [F31.2] 08/31/2014      Plan Of Care/Follow-up recommendations:  Activity:  as tolerated Diet:  heart healthy diet  Is patient on multiple antipsychotic therapies at discharge:  No   Has Patient had three or more failed trials of antipsychotic monotherapy by history:  No  Recommended Plan for Multiple Antipsychotic Therapies: NA    Brenda Samano, PMH-NP 02/05/2015, 11:25 AM

## 2015-02-11 ENCOUNTER — Emergency Department (HOSPITAL_COMMUNITY)
Admission: EM | Admit: 2015-02-11 | Discharge: 2015-02-12 | Disposition: A | Discharge status: Home / Self Care | Payer: Federal, State, Local not specified - Other | Attending: Emergency Medicine | Admitting: Emergency Medicine

## 2015-02-11 ENCOUNTER — Encounter (HOSPITAL_COMMUNITY): Payer: Self-pay | Admitting: *Deleted

## 2015-02-11 DIAGNOSIS — F250 Schizoaffective disorder, bipolar type: Secondary | ICD-10-CM | POA: Diagnosis not present

## 2015-02-11 DIAGNOSIS — F319 Bipolar disorder, unspecified: Secondary | ICD-10-CM

## 2015-02-11 DIAGNOSIS — Z79899 Other long term (current) drug therapy: Secondary | ICD-10-CM | POA: Insufficient documentation

## 2015-02-11 DIAGNOSIS — R45850 Homicidal ideations: Secondary | ICD-10-CM | POA: Insufficient documentation

## 2015-02-11 DIAGNOSIS — Z720 Tobacco use: Secondary | ICD-10-CM | POA: Insufficient documentation

## 2015-02-11 LAB — COMPREHENSIVE METABOLIC PANEL
ALBUMIN: 4.1 g/dL (ref 3.5–5.2)
ALK PHOS: 76 U/L (ref 39–117)
ALT: 29 U/L (ref 0–53)
ANION GAP: 8 (ref 5–15)
AST: 30 U/L (ref 0–37)
BILIRUBIN TOTAL: 0.4 mg/dL (ref 0.3–1.2)
BUN: 7 mg/dL (ref 6–23)
CHLORIDE: 105 mmol/L (ref 96–112)
CO2: 25 mmol/L (ref 19–32)
Calcium: 9.2 mg/dL (ref 8.4–10.5)
Creatinine, Ser: 0.81 mg/dL (ref 0.50–1.35)
GFR calc Af Amer: >90 mL/min (ref >90)
GFR calc non Af Amer: >90 mL/min (ref >90)
Glucose, Bld: 102 mg/dL — ABNORMAL HIGH (ref 70–99)
POTASSIUM: 3.6 mmol/L (ref 3.5–5.1)
SODIUM: 138 mmol/L (ref 135–145)
Total Protein: 7.3 g/dL (ref 6.0–8.3)

## 2015-02-11 LAB — RAPID URINE DRUG SCREEN, HOSP PERFORMED
Amphetamines: NOT DETECTED
BARBITURATES: NOT DETECTED
BENZODIAZEPINES: NOT DETECTED
Cocaine: NOT DETECTED
OPIATES: NOT DETECTED
TETRAHYDROCANNABINOL: POSITIVE — AB

## 2015-02-11 LAB — CBC
HCT: 38.5 % — ABNORMAL LOW (ref 39.0–52.0)
HEMOGLOBIN: 12.9 g/dL — AB (ref 13.0–17.0)
MCH: 29.3 pg (ref 26.0–34.0)
MCHC: 33.5 g/dL (ref 30.0–36.0)
MCV: 87.5 fL (ref 78.0–100.0)
Platelets: 293 10*3/uL (ref 150–400)
RBC: 4.4 MIL/uL (ref 4.22–5.81)
RDW: 13.2 % (ref 11.5–15.5)
WBC: 7 10*3/uL (ref 4.0–10.5)

## 2015-02-11 LAB — ACETAMINOPHEN LEVEL: Acetaminophen (Tylenol), Serum: <10 ug/mL — ABNORMAL LOW (ref 10–30)

## 2015-02-11 LAB — ETHANOL: Alcohol, Ethyl (B): <5 mg/dL (ref 0–9)

## 2015-02-11 LAB — SALICYLATE LEVEL

## 2015-02-11 LAB — LITHIUM LEVEL: LITHIUM LVL: 0.15 mmol/L — AB (ref 0.80–1.40)

## 2015-02-11 MED ORDER — OLANZAPINE 10 MG PO TBDP
20.0000 mg | ORAL_TABLET | Freq: Every day | ORAL | Status: DC
  Administered 2015-02-11: 20 mg via ORAL
  Filled 2015-02-11: qty 2

## 2015-02-11 MED ORDER — LITHIUM CARBONATE 300 MG PO CAPS
300.0000 mg | ORAL_CAPSULE | Freq: Two times a day (BID) | ORAL | Status: DC
  Administered 2015-02-11 – 2015-02-12 (×3): 300 mg via ORAL
  Filled 2015-02-11 (×3): qty 1

## 2015-02-11 MED ORDER — HYDROXYZINE HCL 25 MG PO TABS
25.0000 mg | ORAL_TABLET | Freq: Four times a day (QID) | ORAL | Status: DC | PRN
  Administered 2015-02-11 (×2): 25 mg via ORAL
  Filled 2015-02-11 (×2): qty 1

## 2015-02-11 MED ORDER — AMPHETAMINE-DEXTROAMPHETAMINE 20 MG PO TABS
20.0000 mg | ORAL_TABLET | Freq: Every day | ORAL | Status: DC
  Administered 2015-02-11 – 2015-02-12 (×2): 20 mg via ORAL
  Filled 2015-02-11 (×2): qty 1

## 2015-02-11 MED ORDER — BENZTROPINE MESYLATE 1 MG PO TABS
1.0000 mg | ORAL_TABLET | Freq: Two times a day (BID) | ORAL | Status: DC
  Administered 2015-02-11 – 2015-02-12 (×4): 1 mg via ORAL
  Filled 2015-02-11 (×4): qty 1

## 2015-02-11 MED ORDER — ALPRAZOLAM 1 MG PO TABS
1.0000 mg | ORAL_TABLET | Freq: Every day | ORAL | Status: DC
  Administered 2015-02-11: 1 mg via ORAL
  Filled 2015-02-11: qty 1
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
## 2015-02-11 NOTE — ED Notes (Signed)
Pt voluntarily gave up a razor blade he had in his pocket to use for his homicidal actions he was planning

## 2015-02-11 NOTE — ED Notes (Signed)
Patient denies complaint at present. Pleasant, cooperative; delusions of grandeur. Denies feeling anxious or depressed. Denies SI, HI, AVH. States that he "don't need the medications. I don't need in patient tx. I come here to calm down for a few".  Encouragement offered.  Q 15 safety checks continue.
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
## 2015-02-11 NOTE — ED Notes (Signed)
Patient denies SI, HI and AVH. Patient oriented to unit. Plan of care discussed with patient. Patient given a meal.  Patient voices no concerns and complaints at this time. Encouragement and support provided and safety maintain. Q 15 min safety in place.
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
## 2015-02-12 NOTE — [facility] Suicide Risk Assessment (Cosign Needed)
Suicide Risk Assessment  Discharge Assessment   [facility]   Demographic Factors:  Male, Low socioeconomic status and Unemployed  Total Time spent with patient: 20 minutes  Musculoskeletal: Strength & Muscle Tone: within normal limits Gait & Station: normal Patient leans: N/A  Psychiatric Specialty Exam:     Blood pressure 130/82, pulse 70, temperature 98 F (36.7 C), temperature source Oral, resp. rate 18, SpO2 100 %.There is no weight on file to calculate BMI.  General Appearance: Casual and Fairly Groomed  Eye Contact::  Good  Speech:  Clear and Coherent and Normal Rate409  Volume:  Normal  Mood:  Euthymic  Affect:  Congruent  Thought Process:  Coherent, Goal Directed and Intact  Orientation:  Full (Time, Place, and Person)  Thought Content:  WDL  Suicidal Thoughts:  No  Homicidal Thoughts:  No  Memory:  Immediate;   Good Recent;   Good Remote;   Good  Judgement:  Fair  Insight:  Fair  Psychomotor Activity:  Normal  Concentration:  Good  Recall:  NA  Fund of Knowledge:Fair  Language: Good  Akathisia:  NA  Handed:  Right  AIMS (if indicated):     Assets:  Desire for Improvement  Sleep:     Cognition: WNL  ADL's:  Intact      Has this patient used any form of tobacco in the last 30 days? (Cigarettes, Smokeless Tobacco, Cigars, and/or Pipes) Yes, A prescription for an FDA-approved tobacco cessation medication was offered at discharge and the patient refused  Mental Status Per Nursing Assessment::   On Admission:     Current Mental Status by Physician: NA  Loss Factors: NA  Historical Factors: NA  Risk Reduction Factors:   Religious beliefs about death, Living with another person, especially a relative and Positive social support  Continued Clinical Symptoms:  Bipolar Disorder:   Depressive phase Alcohol/Substance Abuse/Dependencies  Cognitive Features That Contribute To Risk:  Polarized thinking    Suicide Risk:   Minimal: No identifiable suicidal ideation.  Patients presenting with no risk factors but with morbid ruminations; may be classified as minimal risk based on the severity of the depressive symptoms  Principal Problem: Schizoaffective disorder, bipolar type Discharge Diagnoses:  Patient Active Problem List   Diagnosis Date Noted  . Schizoaffective disorder, bipolar type [F25.0]     Priority: High  . Agitation [R45.1] 09/16/2014  . Polysubstance abuse [F19.10] 09/16/2014  . Substance induced mood disorder [F19.94] 09/16/2014  . Cannabis use disorder, severe, dependence [F12.20]   . Hyperammonemia [E72.20] 09/07/2014  . Psychotic disorder [F29] 09/01/2014  . Bipolar affective disorder, current episode manic with psychotic symptoms [F31.2] 08/31/2014    Follow-up Information    [facility]   [facility]   Contact information:   [address] [place] [address] [phone]       Plan Of Care/Follow-up recommendations:  Activity:  as tolerated Diet:  regular  Is patient on multiple antipsychotic therapies at discharge:  No   Has Patient had three or more failed trials of antipsychotic monotherapy by history:  No  Recommended Plan for Multiple Antipsychotic Therapies: NA    Amber Williard C   PMHNP-BC 02/12/2015, 11:38 AM

## 2015-02-12 NOTE — ED Notes (Signed)
Acuity low. 

## 2015-02-17 ENCOUNTER — Emergency Department (HOSPITAL_COMMUNITY)
Admission: EM | Admit: 2015-02-17 | Discharge: 2015-02-17 | Disposition: A | Discharge status: Home / Self Care | Payer: Self-pay | Attending: Emergency Medicine | Admitting: Emergency Medicine

## 2015-02-17 ENCOUNTER — Encounter (HOSPITAL_COMMUNITY): Payer: Self-pay

## 2015-02-17 DIAGNOSIS — Z79899 Other long term (current) drug therapy: Secondary | ICD-10-CM | POA: Insufficient documentation

## 2015-02-17 DIAGNOSIS — F250 Schizoaffective disorder, bipolar type: Secondary | ICD-10-CM | POA: Diagnosis present

## 2015-02-17 DIAGNOSIS — Z720 Tobacco use: Secondary | ICD-10-CM | POA: Insufficient documentation

## 2015-02-17 DIAGNOSIS — R45850 Homicidal ideations: Secondary | ICD-10-CM | POA: Insufficient documentation

## 2015-02-17 LAB — CBC
HEMATOCRIT: 38.1 % — AB (ref 39.0–52.0)
Hemoglobin: 12.5 g/dL — ABNORMAL LOW (ref 13.0–17.0)
MCH: 28.8 pg (ref 26.0–34.0)
MCHC: 32.8 g/dL (ref 30.0–36.0)
MCV: 87.8 fL (ref 78.0–100.0)
Platelets: 277 10*3/uL (ref 150–400)
RBC: 4.34 MIL/uL (ref 4.22–5.81)
RDW: 13.4 % (ref 11.5–15.5)
WBC: 7.2 10*3/uL (ref 4.0–10.5)

## 2015-02-17 LAB — COMPREHENSIVE METABOLIC PANEL
ALT: 24 U/L (ref 0–53)
ANION GAP: 8 (ref 5–15)
AST: 32 U/L (ref 0–37)
Albumin: 3.9 g/dL (ref 3.5–5.2)
Alkaline Phosphatase: 98 U/L (ref 39–117)
BUN: 12 mg/dL (ref 6–23)
CALCIUM: 8.8 mg/dL (ref 8.4–10.5)
CO2: 23 mmol/L (ref 19–32)
CREATININE: 0.76 mg/dL (ref 0.50–1.35)
Chloride: 108 mmol/L (ref 96–112)
GFR calc non Af Amer: >90 mL/min (ref >90)
GLUCOSE: 125 mg/dL — AB (ref 70–99)
Potassium: 4 mmol/L (ref 3.5–5.1)
Sodium: 139 mmol/L (ref 135–145)
Total Bilirubin: 0.5 mg/dL (ref 0.3–1.2)
Total Protein: 6.9 g/dL (ref 6.0–8.3)

## 2015-02-17 LAB — LITHIUM LEVEL: Lithium Lvl: <0.06 mmol/L — ABNORMAL LOW (ref 0.80–1.40)

## 2015-02-17 LAB — ETHANOL

## 2015-02-17 LAB — SALICYLATE LEVEL

## 2015-02-17 LAB — ACETAMINOPHEN LEVEL

## 2015-02-17 LAB — RAPID URINE DRUG SCREEN, HOSP PERFORMED
Amphetamines: NOT DETECTED
BARBITURATES: NOT DETECTED
Benzodiazepines: NOT DETECTED
COCAINE: NOT DETECTED
Opiates: NOT DETECTED
Tetrahydrocannabinol: POSITIVE — AB

## 2015-02-17 MED ORDER — AMITRIPTYLINE HCL 25 MG PO TABS: 25.0000 mg | ORAL_TABLET | Freq: Every day | ORAL | Status: DC

## 2015-02-17 MED ORDER — HYDROXYZINE HCL 25 MG PO TABS
25.0000 mg | ORAL_TABLET | Freq: Four times a day (QID) | ORAL | Status: DC | PRN
  Administered 2015-02-17: 25 mg via ORAL
  Filled 2015-02-17: qty 1

## 2015-02-17 MED ORDER — BENZTROPINE MESYLATE 1 MG PO TABS
1.0000 mg | ORAL_TABLET | Freq: Two times a day (BID) | ORAL | Status: DC
  Administered 2015-02-17: 1 mg via ORAL
  Filled 2015-02-17: qty 1

## 2015-02-17 MED ORDER — AMPHETAMINE-DEXTROAMPHETAMINE 20 MG PO TABS
20.0000 mg | ORAL_TABLET | Freq: Every day | ORAL | Status: DC
  Administered 2015-02-17: 20 mg via ORAL
  Filled 2015-02-17: qty 1

## 2015-02-17 MED ORDER — OLANZAPINE 10 MG PO TBDP: 20.0000 mg | ORAL_TABLET | Freq: Every day | ORAL | Status: DC

## 2015-02-17 MED ORDER — LITHIUM CARBONATE 300 MG PO CAPS
300.0000 mg | ORAL_CAPSULE | Freq: Two times a day (BID) | ORAL | Status: DC
  Administered 2015-02-17: 300 mg via ORAL
  Filled 2015-02-17: qty 1

## 2015-02-17 NOTE — BH Assessment (Signed)
Received TTS consult request. Spoke to Dr. Elesa MassedWard who said Pt states he wants to kill his whole family and will not contract for safety. Assessment will be initiated.  Harlin RainFord Ellis Patsy BaltimoreWarrick Jr, LPC, [facility], [facility] Triage Specialist [phone]

## 2015-02-17 NOTE — Discharge Instructions (Signed)
For your ongoing mental health needs, you are advised to follow up with [facility].  New and returning patients are seen at their walk-in clinic.  Walk-in hours are Monday - Friday from 8:00 am - 3:00 pm.  Walk-in patients are seen on a first come, first served basis.  Try to arrive as early as possible for he best chance of being seen the same day: ° °     [facility] °     [address] °     [place], [place] [address] °     [phone] °

## 2015-02-17 NOTE — ED Notes (Signed)
Pt. and belongings wanded by security 

## 2015-02-17 NOTE — ED Notes (Signed)
Patient discharged to home.  Left the unit ambulatory.  Prior to leaving he was in the hallway yelling and demanding that prescriptions be written for him.  He kept going up the the doctor and telling him that he needed to write discharge orders now.  He was escorted out to the front by another Charity fundraiserN.
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
## 2015-02-17 NOTE — Consult Note (Signed)
[place] Psychiatry Consult   Reason for Consult: ANXIETY, AGITATION Referring Physician:  EDP Patient Identification: Philip [name] MRN:  [id number] Principal Diagnosis: Schizoaffective disorder, bipolar type Diagnosis:   Patient Active Problem List   Diagnosis Date Noted  . Schizoaffective disorder, bipolar type [F25.0]     Priority: High  . Agitation [R45.1] 09/16/2014  . Polysubstance abuse [F19.10] 09/16/2014  . Substance induced mood disorder [F19.94] 09/16/2014  . Cannabis use disorder, severe, dependence [F12.20]   . Hyperammonemia [E72.20] 09/07/2014  . Psychotic disorder [F29] 09/01/2014  . Bipolar affective disorder, current episode manic with psychotic symptoms [F31.2] 08/31/2014    Total Time spent with patient: 45 minutes  Subjective:   Philip [name] is a 24 y.o. male patient admitted with Anxiety, Agitation, Bipolar disorder.  HPI:  AA male, 24 years old was evaluated for anxiety and agitation.  This is his 4th visit in this month and was discharged last week to follow up with [facility] his outpatient providers.  Patient stated that he needed a place last night to "keep a cool head and avoid getting in trouble" so he decide to come in to the ER.  Patient stated that he did not keep his appointment with [facility] because he travelled to [place].  Patient also stated that he came in to the ER for prescription of his medications that [facility] providers refused to give him.  He became angry and agitated because he was denied prescription for Klonopin and  Adderall.  Patient became loud and yelled at staff members  And threatened the providers that he will wait for them at the parking lot.  He was discharged home and encouraged to follow up with [facility].  HPI Elements:   Location:  Scizoaffective disorder, bipolar type, . Quality:  severe. Severity:  severe. Timing:  Acute. Duration:  Chronic mental illness. Context:  seeking prescription for Klonopin and  Adderall..  Past Medical History:  Past Medical History  Diagnosis Date  . Bipolar affective disorder   . Schizophrenia   . ADHD (attention deficit hyperactivity disorder)    History reviewed. No pertinent past surgical history. Family History:  Family History  Problem Relation Age of Onset  . Hypertension Mother    Social History:  History  Alcohol Use  . 0.6 oz/week  . 1 Cans of beer per week     History  Drug Use  . Yes  . Special: Marijuana    History   Social History  . Marital Status: Married    Spouse Name: N/A  . Number of Children: N/A  . Years of Education: N/A   Social History Main Topics  . Smoking status: Current Every Day Smoker -- 1.00 packs/day for 6 years  . Smokeless tobacco: Not on file  . Alcohol Use: 0.6 oz/week    1 Cans of beer per week  . Drug Use: Yes    Special: Marijuana  . Sexual Activity: Not Currently   Other Topics Concern  . None   Social History Narrative   Additional Social History:    Pain Medications: Pt denies Prescriptions: See PTA list Over the Counter: Pt denies History of alcohol / drug use?: Yes Longest period of sobriety (when/how long): Pt could not recall Negative Consequences of Use: Personal relationships, Legal Name of Substance 1: Alcohol 1 - Age of First Use: 18 1 - Amount (size/oz): Pt states unknown 1 - Frequency: occasional 1 - Duration: ongoing 1 - Last Use / Amount: 02/16/15 Name of  Substance 2: Marijuana 2 - Age of First Use: 13 2 - Amount (size/oz): 1 blunt 2 - Frequency: occasional 2 - Duration: ongoing 2 - Last Use / Amount: 02/16/15                 Allergies:   Allergies  Allergen Reactions  . Shrimp [Shellfish Allergy] Anaphylaxis    Labs:  Results for orders placed or performed during the hospital encounter of 02/17/15 (from the past 48 hour(s))  Acetaminophen level     Status: Abnormal   Collection Time: 02/17/15  1:03 AM  Result Value Ref Range   Acetaminophen  (Tylenol), Serum <10.0 (L) 10 - 30 ug/mL    Comment:        THERAPEUTIC CONCENTRATIONS VARY SIGNIFICANTLY. A RANGE OF 10-30 ug/mL MAY BE AN EFFECTIVE CONCENTRATION FOR MANY PATIENTS. HOWEVER, SOME ARE BEST TREATED AT CONCENTRATIONS OUTSIDE THIS RANGE. ACETAMINOPHEN CONCENTRATIONS >150 ug/mL AT 4 HOURS AFTER INGESTION AND >50 ug/mL AT 12 HOURS AFTER INGESTION ARE OFTEN ASSOCIATED WITH TOXIC REACTIONS.   CBC     Status: Abnormal   Collection Time: 02/17/15  1:03 AM  Result Value Ref Range   WBC 7.2 4.0 - 10.5 K/uL   RBC 4.34 4.22 - 5.81 MIL/uL   Hemoglobin 12.5 (L) 13.0 - 17.0 g/dL   HCT 38.1 (L) 39.0 - 52.0 %   MCV 87.8 78.0 - 100.0 fL   MCH 28.8 26.0 - 34.0 pg   MCHC 32.8 30.0 - 36.0 g/dL   RDW 13.4 11.5 - 15.5 %   Platelets 277 150 - 400 K/uL  Comprehensive metabolic panel     Status: Abnormal   Collection Time: 02/17/15  1:03 AM  Result Value Ref Range   Sodium 139 135 - 145 mmol/L   Potassium 4.0 3.5 - 5.1 mmol/L   Chloride 108 96 - 112 mmol/L   CO2 23 19 - 32 mmol/L   Glucose, Bld 125 (H) 70 - 99 mg/dL   BUN 12 6 - 23 mg/dL   Creatinine, Ser 0.76 0.50 - 1.35 mg/dL   Calcium 8.8 8.4 - 10.5 mg/dL   Total Protein 6.9 6.0 - 8.3 g/dL   Albumin 3.9 3.5 - 5.2 g/dL   AST 32 0 - 37 U/L   ALT 24 0 - 53 U/L   Alkaline Phosphatase 98 39 - 117 U/L   Total Bilirubin 0.5 0.3 - 1.2 mg/dL   GFR calc non Af Amer >90 >90 mL/min   GFR calc Af Amer >90 >90 mL/min    Comment: (NOTE) The eGFR has been calculated using the CKD EPI equation. This calculation has not been validated in all clinical situations. eGFR's persistently <90 mL/min signify possible Chronic Kidney Disease.    Anion gap 8 5 - 15  Ethanol (ETOH)     Status: None   Collection Time: 02/17/15  1:03 AM  Result Value Ref Range   Alcohol, Ethyl (B) <5 0 - 9 mg/dL    Comment:        LOWEST DETECTABLE LIMIT FOR SERUM ALCOHOL IS 11 mg/dL FOR MEDICAL PURPOSES ONLY   Salicylate level     Status: None   Collection  Time: 02/17/15  1:03 AM  Result Value Ref Range   Salicylate Lvl <6.2 2.8 - 20.0 mg/dL  Urine Drug Screen     Status: Abnormal   Collection Time: 02/17/15  1:03 AM  Result Value Ref Range   Opiates NONE DETECTED NONE DETECTED   Cocaine NONE DETECTED  NONE DETECTED   Benzodiazepines NONE DETECTED NONE DETECTED   Amphetamines NONE DETECTED NONE DETECTED   Tetrahydrocannabinol POSITIVE (A) NONE DETECTED   Barbiturates NONE DETECTED NONE DETECTED    Comment:        DRUG SCREEN FOR MEDICAL PURPOSES ONLY.  IF CONFIRMATION IS NEEDED FOR ANY PURPOSE, NOTIFY LAB WITHIN 5 DAYS.        LOWEST DETECTABLE LIMITS FOR URINE DRUG SCREEN Drug Class       Cutoff (ng/mL) Amphetamine      1000 Barbiturate      200 Benzodiazepine   093 Tricyclics       818 Opiates          300 Cocaine          300 THC              50   Lithium level     Status: Abnormal   Collection Time: 02/17/15  2:28 AM  Result Value Ref Range   Lithium Lvl <0.06 (L) 0.80 - 1.40 mmol/L    Vitals: Blood pressure 113/61, pulse 76, temperature 97.6 F (36.4 C), temperature source Oral, resp. rate 18, SpO2 100 %.  Risk to Self: Suicidal Ideation: No Suicidal Intent: No Is patient at risk for suicide?: No Suicidal Plan?: No Access to Means: No Specify Access to Suicidal Means: None What has been your use of drugs/alcohol within the last 12 months?: Pt reports using alcohol and marijuana How many times?: 0 Other Self Harm Risks: Pt has limited insight and judgment Triggers for Past Attempts: None known Intentional Self Injurious Behavior: None Risk to Others: Homicidal Ideation: Yes-Currently Present Thoughts of Harm to Others: Yes-Currently Present Comment - Thoughts of Harm to Others: Pt reported HI toward family to EDP, denies current HI "but that could change" Current Homicidal Intent: No Current Homicidal Plan: No Access to Homicidal Means: No Describe Access to Homicidal Means: None Identified Victim: Family  members History of harm to others?: No Assessment of Violence: None Noted Violent Behavior Description: None Does patient have access to weapons?: No Criminal Charges Pending?: No Does patient have a court date: No (On probation) Prior Inpatient Therapy: Prior Inpatient Therapy: Yes Prior Therapy Dates: March 2016, multiple admits Prior Therapy Facilty/Provider(s): [facility] Reason for Treatment: psychosis Prior Outpatient Therapy: Prior Outpatient Therapy: Yes Prior Therapy Dates: 2016 Prior Therapy Facilty/Provider(s): [facility] Reason for Treatment: med mgmt  No current facility-administered medications for this encounter.   Current Outpatient Prescriptions  Medication Sig Dispense Refill  . albuterol (PROVENTIL HFA;VENTOLIN HFA) 108 (90 BASE) MCG/ACT inhaler Inhale 2 puffs into the lungs every 6 (six) hours as needed for wheezing or shortness of breath.    . ALPRAZolam (XANAX) 0.5 MG tablet Take 0.5 mg by mouth at bedtime as needed for anxiety.    Marland Kitchen amitriptyline (ELAVIL) 25 MG tablet Take 1 tablet (25 mg total) by mouth at bedtime. 30 tablet 0  . amphetamine-dextroamphetamine (ADDERALL) 20 MG tablet Take 1 tablet (20 mg total) by mouth daily. 14 tablet 0  . benztropine (COGENTIN) 1 MG tablet Take 1 tablet (1 mg total) by mouth 2 (two) times daily. 60 tablet 0  . hydroxypropyl methylcellulose / hypromellose (ISOPTO TEARS / GONIOVISC) 2.5 % ophthalmic solution Place 1 drop into both eyes 3 (three) times daily as needed for dry eyes.    . hydrOXYzine (ATARAX/VISTARIL) 25 MG tablet Take 1 tablet (25 mg total) by mouth every 6 (six) hours as needed for anxiety. 30 tablet 0  .  lithium carbonate 300 MG capsule Take 1 capsule (300 mg total) by mouth 2 (two) times daily with a meal. 60 capsule 0  . OLANZapine zydis (ZYPREXA) 20 MG disintegrating tablet Take 1 tablet (20 mg total) by mouth at bedtime. 30 tablet 0  . oxyCODONE-acetaminophen (PERCOCET/ROXICET) 5-325 MG per tablet Take 1 tablet by mouth  every 4 (four) hours as needed for severe pain.      Musculoskeletal: Strength & Muscle Tone: within normal limits Gait & Station: normal Patient leans: N/A  Psychiatric Specialty Exam:     Blood pressure 113/61, pulse 76, temperature 97.6 F (36.4 C), temperature source Oral, resp. rate 18, SpO2 100 %.There is no weight on file to calculate BMI.  General Appearance: Casual and Fairly Groomed  Eye Contact::  Good  Speech:  Clear and Coherent and Normal Rate  Volume:  Normal  Mood:  Angry and Anxious  Affect:  Congruent  Thought Process:  Coherent, Goal Directed and Intact  Orientation:  Full (Time, Place, and Person)  Thought Content:  WDL  Suicidal Thoughts:  No  Homicidal Thoughts:  No  Memory:  Immediate;   Good Recent;   Good Remote;   Good  Judgement:  Poor  Insight:  Shallow  Psychomotor Activity:  Normal  Concentration:  Fair  Recall:  NA  Fund of Knowledge:Fair  Language: Good  Akathisia:  NA  Handed:  Right  AIMS (if indicated):     Assets:  Desire for Improvement  ADL's:  Intact  Cognition: WNL  Sleep:      Medical Decision Making: Established Problem, Stable/Improving (1)  Treatment Plan Summary: discharge  home  Plan:  discharge home Disposition: see  above  Delfin Gant 02/17/2015 5:29 PM Patient seen face-to-face for psychiatric evaluation, chart reviewed and case discussed with the physician extender and developed treatment plan. Reviewed the information documented and agree with the treatment plan. Corena Pilgrim, MD

## 2015-02-17 NOTE — Plan of Care (Signed)
[facility]  Reason for Crisis Plan:  Chronic Mental Illness/Medical Illness   Plan of Care:  Frequent ED visits, requesting klonopin, adderall, and percocet. [facility] as patient has kicked in the door but can go to outpatient services. Pt has been told numerous times that prescriptions will not be written as patient was given prescriptions at a previous admission, discharged and came back within the week requesting the prescriptions again. Pt has been advised to go to outpatient provider for prescriptions. Pt has stated, "I'll go to [facility], they'll give me the medications, [facility] wont give me anything anymore." Patient threatened psychiatrist   Family Support:    none  Current Living Environment:  Living Arrangements: Other (Comment), Parent (Was staying with relatives, now homeless)  Insurance:   Hospital Account    Name Acct ID Class Status Primary Coverage   Raymondo BandJames, Glyn D [id number] Emergency Open None        Guarantor Account (for Hospital Account [id number])    Name Relation to Pt Service Area Active? Acct Type   Raymondo BandJames, Vidit D Self CHSA Yes Personal/Family   Address Phone       [address] [place], [place] [id number] [phone])          Coverage Information (for Hospital Account [id number])    Not on file      Legal Guardian:    n/a   Primary Care Provider:  No PCP Per Patient  Current Outpatient Providers:  [facility]  Psychiatrist:  Name of Psychiatrist: [facility]  Counselor/Therapist:  Name of Therapist: [facility]  Compliant with Medications:  No  Additional Information:   Olga CoasterREED, Taliana Mersereau A 4/20/201610:49 AM
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
## 2015-02-17 NOTE — BH Assessment (Signed)
[facility] Assessment Progress Note  Per Thedore MinsMojeed Akintayo, MD, this pt does not require psychiatric hospitalization at this time.  [facility]  This information has been included in his discharge instructions.  Pt's nurse has been notified.  Doylene Canninghomas Kerri-Anne Haeberle, MA Triage Specialist [phone]

## 2015-02-17 NOTE — [facility] Suicide Risk Assessment (Cosign Needed)
Suicide Risk Assessment  Discharge Assessment   [facility]   Demographic Factors:  Male, Adolescent or young adult, Low socioeconomic status, Living alone and Unemployed  Total Time spent with patient: 20 minutes  Musculoskeletal: Strength & Muscle Tone: within normal limits Gait & Station: normal Patient leans: N/A  Psychiatric Specialty Exam:     Blood pressure 113/61, pulse 76, temperature 97.6 F (36.4 C), temperature source Oral, resp. rate 18, SpO2 100 %.There is no weight on file to calculate BMI.  General Appearance: Casual and Fairly Groomed  Patent attorneyye Contact::  Good  Speech:  Clear and Coherent and Normal Rate  Volume:  Normal  Mood:  Angry and Anxious  Affect:  Congruent  Thought Process:  Coherent, Goal Directed and Intact  Orientation:  Full (Time, Place, and Person)  Thought Content:  WDL  Suicidal Thoughts:  No  Homicidal Thoughts:  No  Memory:  Immediate;   Good Recent;   Good Remote;   Good  Judgement:  Fair  Insight:  Shallow  Psychomotor Activity:  Normal  Concentration:  Good  Recall:  NA  Fund of Knowledge:Fair  Language: Good  Akathisia:  NA  Handed:  Right  AIMS (if indicated):     Assets:  Desire for Improvement  Sleep:     Cognition: WNL  ADL's:  Intact      Has this patient used any form of tobacco in the last 30 days? (Cigarettes, Smokeless Tobacco, Cigars, and/or Pipes) N/A  Mental Status Per Nursing Assessment::   On Admission:     Current Mental Status by Physician: NA  Loss Factors: NA  Historical Factors: NA  Risk Reduction Factors:   Living with another person, especially a relative, Positive social support and Positive coping skills or problem solving skills  Continued Clinical Symptoms:  Bipolar Disorder:   Depressive phase Depression:   Insomnia  Cognitive Features That Contribute To Risk:  Polarized thinking    Suicide Risk:  Minimal: No identifiable suicidal ideation.  Patients  presenting with no risk factors but with morbid ruminations; may be classified as minimal risk based on the severity of the depressive symptoms  Principal Problem: Schizoaffective disorder, bipolar type Discharge Diagnoses:  Patient Active Problem List   Diagnosis Date Noted  . Schizoaffective disorder, bipolar type [F25.0]     Priority: High  . Agitation [R45.1] 09/16/2014  . Polysubstance abuse [F19.10] 09/16/2014  . Substance induced mood disorder [F19.94] 09/16/2014  . Cannabis use disorder, severe, dependence [F12.20]   . Hyperammonemia [E72.20] 09/07/2014  . Psychotic disorder [F29] 09/01/2014  . Bipolar affective disorder, current episode manic with psychotic symptoms [F31.2] 08/31/2014      Plan Of Care/Follow-up recommendations:  Activity:  as tolerated Diet:  regular  Is patient on multiple antipsychotic therapies at discharge:  No   Has Patient had three or more failed trials of antipsychotic monotherapy by history:  No  Recommended Plan for Multiple Antipsychotic Therapies: NA    Annaclaire Walsworth C    PMHNP-BC 02/17/2015, 5:56 PM

## 2015-02-17 NOTE — ED Provider Notes (Signed)
TIME SEEN: 2:10 AM  CHIEF COMPLAINT: Homicidal ideation  HPI: Pt is a 24 y.o. male with history of bipolar disorder, schizophrenia, ADHD who presents emergency department with thoughts of wanting to hurt his family. He is unable to tell me who exactly he wants to hurt or how he will do so but states that he thinks that he once to kill people. Denies any SI, hallucinations. Reports drinking alcohol today and of marijuana use regularly. Denies any medical complaints.  ROS: See HPI Constitutional: no fever  Eyes: no drainage  ENT: no runny nose   Cardiovascular:  no chest pain  Resp: no SOB  GI: no vomiting GU: no dysuria Integumentary: no rash  Allergy: no hives  Musculoskeletal: no leg swelling  Neurological: no slurred speech ROS otherwise negative  PAST MEDICAL HISTORY/PAST SURGICAL HISTORY:  Past Medical History  Diagnosis Date  . Bipolar affective disorder   . Schizophrenia   . ADHD (attention deficit hyperactivity disorder)     MEDICATIONS:  Prior to Admission medications   Medication Sig Start Date End Date Taking? Authorizing Provider  ALPRAZolam (XANAX PO) Take 1 tablet by mouth at bedtime.    Historical Provider, MD  amitriptyline (ELAVIL) 25 MG tablet Take 1 tablet (25 mg total) by mouth at bedtime. Patient not taking: Reported on 01/26/2015 01/20/15   Earney NavyJosephine C Onuoha, NP  amphetamine-dextroamphetamine (ADDERALL) 20 MG tablet Take 1 tablet (20 mg total) by mouth daily. 01/20/15   Earney NavyJosephine C Onuoha, NP  benztropine (COGENTIN) 1 MG tablet Take 1 tablet (1 mg total) by mouth 2 (two) times daily. Patient not taking: Reported on 01/26/2015 01/20/15   Earney NavyJosephine C Onuoha, NP  hydrOXYzine (ATARAX/VISTARIL) 25 MG tablet Take 1 tablet (25 mg total) by mouth every 6 (six) hours as needed for anxiety. 09/10/14   Adonis BrookSheila Agustin, NP  lithium carbonate 300 MG capsule Take 1 capsule (300 mg total) by mouth 2 (two) times daily with a meal. 01/20/15   Earney NavyJosephine C Onuoha, NP  OLANZapine  zydis (ZYPREXA) 20 MG disintegrating tablet Take 1 tablet (20 mg total) by mouth at bedtime. 01/20/15   Earney NavyJosephine C Onuoha, NP    ALLERGIES:  Allergies  Allergen Reactions  . Shrimp [Shellfish Allergy] Anaphylaxis    SOCIAL HISTORY:  History  Substance Use Topics  . Smoking status: Current Every Day Smoker -- 1.00 packs/day for 6 years  . Smokeless tobacco: Not on file  . Alcohol Use: 0.6 oz/week    1 Cans of beer per week    FAMILY HISTORY: Family History  Problem Relation Age of Onset  . Hypertension Mother     EXAM: BP 126/66 mmHg  Pulse 102  Temp(Src) 98.6 F (37 C) (Oral)  Resp 20  SpO2 92% CONSTITUTIONAL: Alert and oriented and responds appropriately to questions. Well-appearing; well-nourished HEAD: Normocephalic EYES: Conjunctivae clear, PERRL ENT: normal nose; no rhinorrhea; moist mucous membranes; pharynx without lesions noted NECK: Supple, no meningismus, no LAD  CARD: RRR; S1 and S2 appreciated; no murmurs, no clicks, no rubs, no gallops RESP: Normal chest excursion without splinting or tachypnea; breath sounds clear and equal bilaterally; no wheezes, no rhonchi, no rales,  ABD/GI: Normal bowel sounds; non-distended; soft, non-tender, no rebound, no guarding BACK:  The back appears normal and is non-tender to palpation, there is no CVA tenderness EXT: Normal ROM in all joints; non-tender to palpation; no edema; normal capillary refill; no cyanosis    SKIN: Normal color for age and race; warm NEURO: Moves all extremities  equally PSYCH: Patient has rapid speech but no psychosis. Endorses homicidal thoughts toward his family. No SI. No hallucinations.   MEDICAL DECISION MAKING: Patient here with thoughts of wanting to hurt his family. No medical complaints. Screening labs and urine are unremarkable other than being positive for THC. Lithium is subtherapeutic. Discussed with Ala Dach with behavioral health. They recommend having the patient reevaluated by psychiatry in  the morning for further disposition.      Layla Maw Darlene Bartelt, DO 02/17/15 [phone]

## 2015-06-11 ENCOUNTER — Encounter (HOSPITAL_COMMUNITY): Payer: Self-pay | Admitting: Emergency Medicine

## 2015-06-11 ENCOUNTER — Emergency Department (HOSPITAL_COMMUNITY)
Admission: EM | Admit: 2015-06-11 | Discharge: 2015-06-11 | Discharge status: Against Medical Advice | Payer: Self-pay | Attending: Emergency Medicine | Admitting: Emergency Medicine

## 2015-06-11 DIAGNOSIS — Y998 Other external cause status: Secondary | ICD-10-CM | POA: Insufficient documentation

## 2015-06-11 DIAGNOSIS — Y9289 Other specified places as the place of occurrence of the external cause: Secondary | ICD-10-CM | POA: Insufficient documentation

## 2015-06-11 DIAGNOSIS — S6991XA Unspecified injury of right wrist, hand and finger(s), initial encounter: Secondary | ICD-10-CM | POA: Insufficient documentation

## 2015-06-11 DIAGNOSIS — Y9389 Activity, other specified: Secondary | ICD-10-CM | POA: Insufficient documentation

## 2015-06-11 DIAGNOSIS — X58XXXA Exposure to other specified factors, initial encounter: Secondary | ICD-10-CM | POA: Insufficient documentation

## 2015-06-11 DIAGNOSIS — Z720 Tobacco use: Secondary | ICD-10-CM | POA: Insufficient documentation

## 2015-06-11 NOTE — ED Notes (Signed)
Pts name called for a room no answer 

## 2015-06-11 NOTE — ED Notes (Signed)
Pt. injured his right middle and 4th finger with pain / swelling while practicing boxing this evening .

## 2015-09-25 ENCOUNTER — Emergency Department (HOSPITAL_COMMUNITY)
Admission: EM | Admit: 2015-09-25 | Discharge: 2015-09-25 | Disposition: A | Discharge status: Home / Self Care | Payer: Self-pay

## 2015-09-25 ENCOUNTER — Emergency Department (HOSPITAL_COMMUNITY)
Admission: EM | Admit: 2015-09-25 | Discharge: 2015-09-27 | Disposition: A | Discharge status: Home / Self Care | Payer: Self-pay

## 2015-09-25 DIAGNOSIS — F909 Attention-deficit hyperactivity disorder, unspecified type: Secondary | ICD-10-CM | POA: Insufficient documentation

## 2015-09-25 DIAGNOSIS — F312 Bipolar disorder, current episode manic severe with psychotic features: Secondary | ICD-10-CM | POA: Diagnosis present

## 2015-09-25 DIAGNOSIS — F250 Schizoaffective disorder, bipolar type: Secondary | ICD-10-CM | POA: Insufficient documentation

## 2015-09-25 DIAGNOSIS — R410 Disorientation, unspecified: Secondary | ICD-10-CM | POA: Insufficient documentation

## 2015-09-25 DIAGNOSIS — F1210 Cannabis abuse, uncomplicated: Secondary | ICD-10-CM | POA: Insufficient documentation

## 2015-09-25 DIAGNOSIS — Z79899 Other long term (current) drug therapy: Secondary | ICD-10-CM | POA: Insufficient documentation

## 2015-09-25 DIAGNOSIS — F17200 Nicotine dependence, unspecified, uncomplicated: Secondary | ICD-10-CM | POA: Insufficient documentation

## 2015-09-25 LAB — SALICYLATE LEVEL

## 2015-09-25 LAB — COMPREHENSIVE METABOLIC PANEL
ALK PHOS: 79 U/L (ref 38–126)
ALT: 33 U/L (ref 17–63)
ANION GAP: 10 (ref 5–15)
AST: 66 U/L — ABNORMAL HIGH (ref 15–41)
Albumin: 4.5 g/dL (ref 3.5–5.0)
BILIRUBIN TOTAL: 1.5 mg/dL — AB (ref 0.3–1.2)
BUN: 26 mg/dL — ABNORMAL HIGH (ref 6–20)
CALCIUM: 9.4 mg/dL (ref 8.9–10.3)
CO2: 26 mmol/L (ref 22–32)
Chloride: 103 mmol/L (ref 101–111)
Creatinine, Ser: 1.28 mg/dL — ABNORMAL HIGH (ref 0.61–1.24)
GFR calc non Af Amer: >60 mL/min (ref >60)
Glucose, Bld: 95 mg/dL (ref 65–99)
Potassium: 5 mmol/L (ref 3.5–5.1)
Sodium: 139 mmol/L (ref 135–145)
TOTAL PROTEIN: 7.8 g/dL (ref 6.5–8.1)

## 2015-09-25 LAB — CBG MONITORING, ED: Glucose-Capillary: 81 mg/dL (ref 65–99)

## 2015-09-25 LAB — RAPID URINE DRUG SCREEN, HOSP PERFORMED
Amphetamines: NOT DETECTED
BARBITURATES: NOT DETECTED
Benzodiazepines: NOT DETECTED
COCAINE: NOT DETECTED
OPIATES: NOT DETECTED
Tetrahydrocannabinol: POSITIVE — AB

## 2015-09-25 LAB — CBC
HEMATOCRIT: 41.1 % (ref 39.0–52.0)
Hemoglobin: 13.4 g/dL (ref 13.0–17.0)
MCH: 28.8 pg (ref 26.0–34.0)
MCHC: 32.6 g/dL (ref 30.0–36.0)
MCV: 88.4 fL (ref 78.0–100.0)
PLATELETS: 300 10*3/uL (ref 150–400)
RBC: 4.65 MIL/uL (ref 4.22–5.81)
RDW: 12.9 % (ref 11.5–15.5)
WBC: 8.5 10*3/uL (ref 4.0–10.5)

## 2015-09-25 LAB — ACETAMINOPHEN LEVEL

## 2015-09-25 LAB — LITHIUM LEVEL: Lithium Lvl: <0.06 mmol/L — ABNORMAL LOW (ref 0.60–1.20)

## 2015-09-25 LAB — ETHANOL

## 2015-09-25 MED ORDER — SODIUM CHLORIDE 0.9 % IV BOLUS (SEPSIS)
1000.0000 mL | Freq: Once | INTRAVENOUS | Status: AC
  Administered 2015-09-25: 1000 mL via INTRAVENOUS

## 2015-09-25 MED ORDER — AMITRIPTYLINE HCL 25 MG PO TABS
25.0000 mg | ORAL_TABLET | Freq: Every day | ORAL | Status: DC
  Administered 2015-09-25 – 2015-09-26 (×2): 25 mg via ORAL
  Filled 2015-09-25 (×2): qty 1

## 2015-09-25 MED ORDER — LORAZEPAM 2 MG/ML IJ SOLN
2.0000 mg | Freq: Once | INTRAMUSCULAR | Status: AC
  Administered 2015-09-25: 2 mg via INTRAMUSCULAR
  Filled 2015-09-25: qty 1

## 2015-09-25 MED ORDER — ZIPRASIDONE MESYLATE 20 MG IM SOLR
20.0000 mg | Freq: Once | INTRAMUSCULAR | Status: AC
  Administered 2015-09-25: 20 mg via INTRAMUSCULAR
  Filled 2015-09-25: qty 20

## 2015-09-25 MED ORDER — STERILE WATER FOR INJECTION IJ SOLN
INTRAMUSCULAR | Status: AC
  Administered 2015-09-25: 10 mL
  Filled 2015-09-25: qty 10

## 2015-09-25 MED ORDER — ALBUTEROL SULFATE HFA 108 (90 BASE) MCG/ACT IN AERS: 2.0000 | INHALATION_SPRAY | Freq: Four times a day (QID) | RESPIRATORY_TRACT | Status: DC | PRN

## 2015-09-25 MED ORDER — OLANZAPINE 10 MG PO TBDP
20.0000 mg | ORAL_TABLET | Freq: Every day | ORAL | Status: DC
  Administered 2015-09-25 – 2015-09-26 (×2): 20 mg via ORAL
  Filled 2015-09-25 (×3): qty 2

## 2015-09-25 MED ORDER — BENZTROPINE MESYLATE 1 MG PO TABS
1.0000 mg | ORAL_TABLET | Freq: Two times a day (BID) | ORAL | Status: DC
  Administered 2015-09-25 – 2015-09-27 (×4): 1 mg via ORAL
  Filled 2015-09-25 (×4): qty 1

## 2015-09-25 NOTE — ED Notes (Signed)
EMS Vitals HR 120 O2 98% BP 138/87 CBG 93
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
## 2015-09-25 NOTE — ED Notes (Signed)
Bed: [id number] Expected date:  Expected time:  Means of arrival:  Comments: Found naked 24 y/o M

## 2015-09-25 NOTE — ED Notes (Signed)
Patient entered triage room. Patient began stripping his clothes off. Patient was asked why he was undressing. Patient states "I done been here'd a millionth times". Patient was questioned if he was having thoughts of hurting himself or others. Patient denied. Patient was asked to have a seat in triage room and staff would be with him shortly. Patient then walked out and spoke to off duty [employer] and said "I'm leaving here". Patient then walked out throwing his patient labels onto the desk.

## 2015-09-25 NOTE — ED Notes (Signed)
TTS at bedside. 

## 2015-09-25 NOTE — ED Provider Notes (Signed)
Filed Vitals:   09/25/15 1826 09/25/15 1907  BP: 121/94 119/79  Pulse: 91 87  Temp:  97.4 F (36.3 C)  Resp: 22 16   Patient is now more awake than on arrival. He is able to answer questions. He is oriented to person and place. He initially was confused as to date but they did see this 2016. He still has some evident psychosis present. I feel that he is cleared to do a TTS assessment.  Rolan BuccoMelanie Kory Panjwani, MD 09/25/15 2236
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
## 2015-09-25 NOTE — [facility] Counselor (Signed)
Disposition: Per Alberteen SamFran Hobson, NP, Pt meets inpt tx criteria. Per Binnie RailJoann Glover, AC, [facility]. [facility] pending discharges. TTS will seek alternative placement for pt in the meantime.  Counselor informed Baxter HireKristen, RN and Dr Fredderick PhenixBelfi of disposition.  - Cyndie MullAnna Harman Ferrin, [facility]   Therapeutic Triage

## 2015-09-25 NOTE — ED Notes (Signed)
Pt calm and resting post medication intervention. Previously pt pulling self up, restless, attempting to get out of bed and removing equiptment. Per Effie ShyWentz apply soft restraints in place of handcuffs. Continue to monitor pt upon awakening from sedative. If pt calm, reassessment of restraint need to be performed.

## 2015-09-25 NOTE — ED Notes (Signed)
Pt repetitive. Repeating words as listed in triage note. Pt pointing to fingers as if naming them.

## 2015-09-25 NOTE — ED Provider Notes (Signed)
CSN: [id number]     Arrival date & time 09/25/15  1336 History   First MD Initiated Contact with Patient 09/25/15 1401     Chief Complaint  Patient presents with  . Altered Mental Status     (Consider location/radiation/quality/duration/timing/severity/associated sxs/prior Treatment) HPI   Philip [name] is a 24 y.o. male who presents for evaluation of bizarre behavior. Patient was standing in the middle of the street, naked, when he was detained by a Emergency planning/management officerpolice officer. He was transferred here, in handcuffs. He is unable to report his name, or state what his bother him. He is directable, and cooperates with simple commands, speaks but answers in a confused fashion.  Level V Caveat- altered mental status   Past Medical History  Diagnosis Date  . Bipolar affective disorder   . Schizophrenia   . ADHD (attention deficit hyperactivity disorder)    No past surgical history on file. Family History  Problem Relation Age of Onset  . Hypertension Mother    Social History  Substance Use Topics  . Smoking status: Current Every Day Smoker -- 1.00 packs/day for 6 years  . Smokeless tobacco: Not on file  . Alcohol Use: 0.6 oz/week    1 Cans of beer per week    Review of Systems  Unable to perform ROS: Mental status change      Allergies  Shrimp  Home Medications   Prior to Admission medications   Medication Sig Start Date End Date Taking? Authorizing Provider  albuterol (PROVENTIL HFA;VENTOLIN HFA) 108 (90 BASE) MCG/ACT inhaler Inhale 2 puffs into the lungs every 6 (six) hours as needed for wheezing or shortness of breath.    Historical Provider, MD  ALPRAZolam Prudy Feeler(XANAX) 0.5 MG tablet Take 0.5 mg by mouth at bedtime as needed for anxiety.    Historical Provider, MD  amitriptyline (ELAVIL) 25 MG tablet Take 1 tablet (25 mg total) by mouth at bedtime. 01/20/15   Earney NavyJosephine C Onuoha, NP  amphetamine-dextroamphetamine (ADDERALL) 20 MG tablet Take 1 tablet (20 mg total) by mouth daily.  01/20/15   Earney NavyJosephine C Onuoha, NP  benztropine (COGENTIN) 1 MG tablet Take 1 tablet (1 mg total) by mouth 2 (two) times daily. 01/20/15   Earney NavyJosephine C Onuoha, NP  hydroxypropyl methylcellulose / hypromellose (ISOPTO TEARS / GONIOVISC) 2.5 % ophthalmic solution Place 1 drop into both eyes 3 (three) times daily as needed for dry eyes.    Historical Provider, MD  hydrOXYzine (ATARAX/VISTARIL) 25 MG tablet Take 1 tablet (25 mg total) by mouth every 6 (six) hours as needed for anxiety. 09/10/14   Adonis BrookSheila Agustin, NP  lithium carbonate 300 MG capsule Take 1 capsule (300 mg total) by mouth 2 (two) times daily with a meal. 01/20/15   Earney NavyJosephine C Onuoha, NP  OLANZapine zydis (ZYPREXA) 20 MG disintegrating tablet Take 1 tablet (20 mg total) by mouth at bedtime. 01/20/15   Earney NavyJosephine C Onuoha, NP  oxyCODONE-acetaminophen (PERCOCET/ROXICET) 5-325 MG per tablet Take 1 tablet by mouth every 4 (four) hours as needed for severe pain.    Historical Provider, MD   BP 121/73 mmHg  Pulse 95  Temp(Src) 97.7 F (36.5 C) (Oral)  Resp 29  SpO2 100% Physical Exam  Constitutional: He appears well-developed and well-nourished. No distress.  HENT:  Head: Normocephalic and atraumatic.  Right Ear: External ear normal.  Left Ear: External ear normal.  Eyes: Conjunctivae and EOM are normal. Pupils are equal, round, and reactive to light.  Neck: Normal range of motion and  phonation normal. Neck supple.  Cardiovascular: Normal rate, regular rhythm and normal heart sounds.   Pulmonary/Chest: Effort normal and breath sounds normal. He exhibits no bony tenderness.  Abdominal: Soft. There is no tenderness.  Musculoskeletal: Normal range of motion.  Neurological: He is alert. No cranial nerve deficit or sensory deficit. He exhibits normal muscle tone. Coordination normal.  No dysarthria.  Skin: Skin is warm, dry and intact.  Psychiatric:  Nonsensical responses, to questions.  Nursing note and vitals reviewed.   ED Course   Procedures (including critical care time)  14:20- Acute confusion, suspect psychosis. Patient was in the emergency department early this morning, and left prior to receiving assessment or treatment.   Labs Review Labs Reviewed  CBC  COMPREHENSIVE METABOLIC PANEL  ETHANOL  SALICYLATE LEVEL  ACETAMINOPHEN LEVEL  URINE RAPID DRUG SCREEN, HOSP PERFORMED  LITHIUM LEVEL  CBG MONITORING, ED    Imaging Review No results found. I have personally reviewed and evaluated these images and lab results as part of my medical decision-making.   EKG Interpretation   Date/Time:  Saturday September 25 2015 14:02:30 EST Ventricular Rate:  97 PR Interval:  140 QRS Duration: 91 QT Interval:  367 QTC Calculation: 466 R Axis:   94 Text Interpretation:  Sinus rhythm Borderline right axis deviation  Probable left ventricular hypertrophy ST elev, probable normal early repol  pattern since last tracing no significant change Confirmed by Effie Shy  MD,  Mechele Collin ([id number]) on 09/25/2015 2:12:28 PM      MDM   Final diagnoses:  Delirium    Unspecified altered mental status, consistent with acute delirium, cause not clear. Patient likely has substance abuse, as a primary or complicating feature.  Nursing Notes Reviewed/ Care Coordinated Applicable Imaging Reviewed Interpretation of Laboratory Data incorporated into ED treatment  Plan- As per TTS in conjunction with oncoming provider team.     Mancel Bale, MD 09/26/15 [phone]

## 2015-09-26 DIAGNOSIS — F312 Bipolar disorder, current episode manic severe with psychotic features: Secondary | ICD-10-CM

## 2015-09-26 LAB — COMPREHENSIVE METABOLIC PANEL
ALT: 29 U/L (ref 17–63)
AST: 46 U/L — ABNORMAL HIGH (ref 15–41)
Albumin: 3.6 g/dL (ref 3.5–5.0)
Alkaline Phosphatase: 71 U/L (ref 38–126)
Anion gap: 5 (ref 5–15)
BILIRUBIN TOTAL: 0.6 mg/dL (ref 0.3–1.2)
BUN: 18 mg/dL (ref 6–20)
CO2: 29 mmol/L (ref 22–32)
CREATININE: 0.85 mg/dL (ref 0.61–1.24)
Calcium: 8.8 mg/dL — ABNORMAL LOW (ref 8.9–10.3)
Chloride: 102 mmol/L (ref 101–111)
GFR calc Af Amer: >60 mL/min (ref >60)
GLUCOSE: 79 mg/dL (ref 65–99)
POTASSIUM: 4 mmol/L (ref 3.5–5.1)
Sodium: 136 mmol/L (ref 135–145)
TOTAL PROTEIN: 6.4 g/dL — AB (ref 6.5–8.1)

## 2015-09-26 MED ORDER — HYDROXYZINE HCL 25 MG PO TABS
25.0000 mg | ORAL_TABLET | Freq: Three times a day (TID) | ORAL | Status: DC | PRN
  Administered 2015-09-26 – 2015-09-27 (×2): 25 mg via ORAL
  Filled 2015-09-26 (×2): qty 1

## 2015-09-26 NOTE — [facility] Counselor (Signed)
TTS Counselor faxed referrals to the following facilities in an effort to obtain inpt placement:  [facility]   - [facility]   Triage Specialist   09/26/15  00:13

## 2015-09-26 NOTE — ED Notes (Signed)
Up to the bathroom 
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
## 2015-09-26 NOTE — ED Notes (Signed)
On the phone 

## 2015-09-26 NOTE — ED Notes (Addendum)
Called report to Wille CelesteJanie, RN at psych ED. Will move pt to room 34

## 2015-09-26 NOTE — ED Notes (Signed)
[id number] Expected date: 09/26/15 Expected time:  Means of arrival:  Comments: Hold for RM 16 plz

## 2015-09-26 NOTE — ED Notes (Signed)
Awake. Verbally responsive. A/O x4. Resp even and unlabored. No audible adventitious breath sounds noted. ABC's intact. Pt ambulating in room. No behavior problems noted. Pt given OJ to drink. Pt denies SI/HI and audible/visual hallucinations. NAD noted.

## 2015-09-26 NOTE — ED Notes (Signed)
Report received from Janie Rambo, RN. Pt. Alert and oriented in no distress denies SI, HI, AVH and pain.  Pt. Instructed to come to me with problems or concerns.Will continue to monitor for safety via security cameras and Q 15 minute checks. 

## 2015-09-26 NOTE — ED Notes (Signed)
Pt ambulatory w/o difficulty to room 

## 2015-09-26 NOTE — ED Provider Notes (Signed)
Patient is becoming more and more agitated. He appears manic on my exam, rapid speech, frequent arm movements, and is asking to leave. He states that "there was something else going on" when he was walking around naked yesterday. He does not seem to have insight into his disease. I have called them down verbally and he is willing to talk to psychiatry again. I feel he still needs close psychiatric care and admission. Currently patient is voluntarily but if he tries to leave without significant improvement I believe he needs to be involuntarily committed.  Pricilla LovelessScott Lorann Tani, MD 09/26/15 [phone]

## 2015-09-26 NOTE — ED Notes (Signed)
Pt escalating after being redirected several times. Dr Criss AlvineGoldston made aware of pt behavior and is at bedside with security.

## 2015-09-26 NOTE — ED Notes (Signed)
Pt calm and cooperative at this time.  Pt stating that he was tired of waiting for another room. Security and [employer] at bedside.

## 2015-09-26 NOTE — ED Notes (Signed)
In room watching tv.

## 2015-09-26 NOTE — ED Notes (Signed)
[facility] called to get psych to see pt sooner d/t behavior. Assessment sts she will contact psychiatrist to see pt ASAP

## 2015-09-26 NOTE — ED Notes (Signed)
Up tot he bathroom to shower and change scrubs 

## 2015-09-26 NOTE — ED Notes (Signed)
Psych MD and NP at bedside. 

## 2015-09-26 NOTE — ED Notes (Signed)
Lab in to draw

## 2015-09-26 NOTE — ED Notes (Signed)
Pt observed awake in room watching TV.  Sts hungry.  Sts only here to relax from stress of family, wants reduced outside stimuli.  Good eye contact, friendly, cooperative, relaxed, non demanding. Peanut butter and grahamn crackers given.

## 2015-09-27 DIAGNOSIS — F250 Schizoaffective disorder, bipolar type: Secondary | ICD-10-CM
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
## 2015-09-27 NOTE — Discharge Instructions (Signed)
For your ongoing mental health needs, you are advised to follow up with [facility].  If you do not currently have an appointment, new and returning patients are seen at their walk-in clinic.  Walk-in hours are Monday - Friday from 8:00 am - 3:00 pm.  Walk-in patients are seen on a first come, first served basis.  Try to arrive as early as possible for he best chance of being seen the same day: ° °     [facility] °     [address] °     [place],  [address] °     [phone] °
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
## 2015-09-27 NOTE — [facility] Suicide Risk Assessment (Signed)
Suicide Risk Assessment  Discharge Assessment   [facility]   Demographic Factors:  Male and Adolescent or young adult  Total Time spent with patient: 30 minutes   Musculoskeletal: Strength & Muscle Tone: within normal limits Gait & Station: normal Patient leans: N/A  Psychiatric Specialty Exam: Review of Systems  Constitutional: Negative.   HENT: Negative.   Eyes: Negative.   Respiratory: Negative.   Cardiovascular: Negative.   Gastrointestinal: Negative.   Genitourinary: Negative.   Musculoskeletal: Negative.   Skin: Negative.   Neurological: Negative.   Endo/Heme/Allergies: Negative.   Psychiatric/Behavioral: Positive for substance abuse.       Delusions    Blood pressure 103/78, pulse 90, temperature 97.5 F (36.4 C), temperature source Oral, resp. rate 18, SpO2 100 %.There is no weight on file to calculate BMI.  General Appearance: Casual  Eye Contact::  Good  Speech:  Normal Rate  Volume:  Normal  Mood:  Euthymic  Affect:  Congruent  Thought Process:  Coherent  Orientation:  Full (Time, Place, and Person)  Thought Content:  WDL  Suicidal Thoughts:  No  Homicidal Thoughts:  No  Memory:  Immediate;   Good Recent;   Good Remote;   Good  Judgement:  Fair  Insight:  Fair  Psychomotor Activity:  Normal  Concentration:  Good  Recall:  Good  Fund of Knowledge:Good  Language: Good  Akathisia:  No  Handed:  Right  AIMS (if indicated):     Assets:  Leisure Time Physical Health Resilience  ADL's:  Intact  Cognition: WNL  Sleep:         Has this patient used any form of tobacco in the last 30 days? (Cigarettes, Smokeless Tobacco, Cigars, and/or Pipes) Yes, A prescription for an FDA-approved tobacco cessation medication was offered at discharge and the patient refused  Mental Status Per Nursing Assessment::   On Admission:   erratic behaviors   Current Mental Status by Physician: NA  Loss Factors: NA  Historical  Factors: NA  Risk Reduction Factors:   Sense of responsibility to family, Employed, Living with another person, especially a relative, Positive social support and Positive therapeutic relationship  Continued Clinical Symptoms:  None  Cognitive Features That Contribute To Risk:  None    Suicide Risk:  Minimal: No identifiable suicidal ideation.  Patients presenting with no risk factors but with morbid ruminations; may be classified as minimal risk based on the severity of the depressive symptoms  Principal Problem: Schizoaffective disorder, bipolar type [facility](HCC) Discharge Diagnoses:  Patient Active Problem List   Diagnosis Date Noted  . Schizoaffective disorder, bipolar type ([facility]) [F25.0]     Priority: High  . Agitation [R45.1] 09/16/2014  . Polysubstance abuse [F19.10] 09/16/2014  . Substance induced mood disorder ([facility]) [F19.94] 09/16/2014  . Cannabis use disorder, severe, dependence ([facility]) [F12.20]   . Hyperammonemia ([facility]) [E72.20] 09/07/2014  . Psychotic disorder [F29] 09/01/2014      Plan Of Care/Follow-up recommendations:  Activity:  as tolerated Diet:  heart healthy diet  Is patient on multiple antipsychotic therapies at discharge:  No   Has Patient had three or more failed trials of antipsychotic monotherapy by history:  No  Recommended Plan for Multiple Antipsychotic Therapies: NA    LORD, JAMISON, PMH-NP 09/27/2015, 5:25 PM

## 2015-09-27 NOTE — [facility] Assessment (Signed)
[facility] Assessment Progress Note  Per Thedore MinsMojeed Akintayo, MD, this pt does not require psychiatric hospitalization at this time.  [facility]  [facility] has been rescinded.  Discharge instructions include referral information for [facility].  Pt's nurse, [name], has been notified.  Please note that pt's mother, Philip [name] [phone]), has called requesting information about pt's disposition.  This has been discussed with the pt and he has refused to sign Consent to Release Information to her.  No further contact with the mother has been initiated by this Clinical research associatewriter.  Philip Canninghomas Netasha Wehrli, MA Triage Specialist [phone]

## 2015-10-23 ENCOUNTER — Emergency Department (HOSPITAL_COMMUNITY)
Admission: EM | Admit: 2015-10-23 | Discharge: 2015-10-25 | Disposition: A | Discharge status: Home / Self Care | Payer: Federal, State, Local not specified - Other | Attending: Emergency Medicine | Admitting: Emergency Medicine

## 2015-10-23 ENCOUNTER — Encounter (HOSPITAL_COMMUNITY): Payer: Self-pay | Admitting: *Deleted

## 2015-10-23 DIAGNOSIS — R45851 Suicidal ideations: Secondary | ICD-10-CM | POA: Insufficient documentation

## 2015-10-23 DIAGNOSIS — Z79899 Other long term (current) drug therapy: Secondary | ICD-10-CM | POA: Insufficient documentation

## 2015-10-23 DIAGNOSIS — F1994 Other psychoactive substance use, unspecified with psychoactive substance-induced mood disorder: Secondary | ICD-10-CM

## 2015-10-23 DIAGNOSIS — F250 Schizoaffective disorder, bipolar type: Secondary | ICD-10-CM | POA: Insufficient documentation

## 2015-10-23 DIAGNOSIS — R451 Restlessness and agitation: Secondary | ICD-10-CM | POA: Insufficient documentation

## 2015-10-23 DIAGNOSIS — F17200 Nicotine dependence, unspecified, uncomplicated: Secondary | ICD-10-CM | POA: Insufficient documentation

## 2015-10-23 DIAGNOSIS — F419 Anxiety disorder, unspecified: Secondary | ICD-10-CM | POA: Insufficient documentation

## 2015-10-23 DIAGNOSIS — F1910 Other psychoactive substance abuse, uncomplicated: Secondary | ICD-10-CM | POA: Diagnosis present

## 2015-10-23 DIAGNOSIS — F909 Attention-deficit hyperactivity disorder, unspecified type: Secondary | ICD-10-CM | POA: Insufficient documentation

## 2015-10-23 DIAGNOSIS — F12188 Cannabis abuse with other cannabis-induced disorder: Secondary | ICD-10-CM | POA: Insufficient documentation

## 2015-10-23 LAB — CBC
HCT: 38.9 % — ABNORMAL LOW (ref 39.0–52.0)
Hemoglobin: 12.6 g/dL — ABNORMAL LOW (ref 13.0–17.0)
MCH: 30 pg (ref 26.0–34.0)
MCHC: 32.4 g/dL (ref 30.0–36.0)
MCV: 92.6 fL (ref 78.0–100.0)
PLATELETS: 251 10*3/uL (ref 150–400)
RBC: 4.2 MIL/uL — AB (ref 4.22–5.81)
RDW: 12.9 % (ref 11.5–15.5)
WBC: 5.3 10*3/uL (ref 4.0–10.5)

## 2015-10-23 LAB — RAPID URINE DRUG SCREEN, HOSP PERFORMED
AMPHETAMINES: NOT DETECTED
Barbiturates: NOT DETECTED
Benzodiazepines: NOT DETECTED
COCAINE: NOT DETECTED
OPIATES: NOT DETECTED
TETRAHYDROCANNABINOL: POSITIVE — AB

## 2015-10-23 LAB — COMPREHENSIVE METABOLIC PANEL
ALT: 36 U/L (ref 17–63)
ANION GAP: 7 (ref 5–15)
AST: 33 U/L (ref 15–41)
Albumin: 4.1 g/dL (ref 3.5–5.0)
Alkaline Phosphatase: 81 U/L (ref 38–126)
BUN: 10 mg/dL (ref 6–20)
CHLORIDE: 107 mmol/L (ref 101–111)
CO2: 29 mmol/L (ref 22–32)
Calcium: 9.4 mg/dL (ref 8.9–10.3)
Creatinine, Ser: 1.11 mg/dL (ref 0.61–1.24)
GFR calc non Af Amer: >60 mL/min (ref >60)
Glucose, Bld: 85 mg/dL (ref 65–99)
Potassium: 4 mmol/L (ref 3.5–5.1)
SODIUM: 143 mmol/L (ref 135–145)
Total Bilirubin: 0.6 mg/dL (ref 0.3–1.2)
Total Protein: 7 g/dL (ref 6.5–8.1)

## 2015-10-23 LAB — SALICYLATE LEVEL: Salicylate Lvl: <4 mg/dL (ref 2.8–30.0)

## 2015-10-23 LAB — ACETAMINOPHEN LEVEL: Acetaminophen (Tylenol), Serum: <10 ug/mL — ABNORMAL LOW (ref 10–30)

## 2015-10-23 LAB — ETHANOL: Alcohol, Ethyl (B): <5 mg/dL (ref <5)

## 2015-10-23 MED ORDER — ONDANSETRON HCL 4 MG PO TABS: 4.0000 mg | ORAL_TABLET | Freq: Three times a day (TID) | ORAL | Status: DC | PRN

## 2015-10-23 MED ORDER — ALPRAZOLAM 0.5 MG PO TABS
0.5000 mg | ORAL_TABLET | Freq: Every evening | ORAL | Status: DC | PRN
Start: 2015-10-23 — End: 2015-10-24

## 2015-10-23 MED ORDER — LORAZEPAM 1 MG PO TABS: 1.0000 mg | ORAL_TABLET | Freq: Three times a day (TID) | ORAL | Status: DC | PRN

## 2015-10-23 MED ORDER — ALBUTEROL SULFATE HFA 108 (90 BASE) MCG/ACT IN AERS: 2.0000 | INHALATION_SPRAY | Freq: Four times a day (QID) | RESPIRATORY_TRACT | Status: DC | PRN

## 2015-10-23 MED ORDER — IBUPROFEN 200 MG PO TABS: 600.0000 mg | ORAL_TABLET | Freq: Three times a day (TID) | ORAL | Status: DC | PRN

## 2015-10-23 MED ORDER — ZIPRASIDONE MESYLATE 20 MG IM SOLR
10.0000 mg | Freq: Once | INTRAMUSCULAR | Status: AC
  Administered 2015-10-23: 10 mg via INTRAMUSCULAR

## 2015-10-23 MED ORDER — ACETAMINOPHEN 325 MG PO TABS: 650.0000 mg | ORAL_TABLET | ORAL | Status: DC | PRN

## 2015-10-23 MED ORDER — STERILE WATER FOR INJECTION IJ SOLN
INTRAMUSCULAR | Status: AC
  Administered 2015-10-23: 1 mL
  Filled 2015-10-23: qty 10

## 2015-10-23 MED ORDER — ZIPRASIDONE MESYLATE 20 MG IM SOLR: 10.0000 mg | Freq: Once | INTRAMUSCULAR | Status: DC

## 2015-10-23 MED ORDER — HYDROXYZINE HCL 25 MG PO TABS: 25.0000 mg | ORAL_TABLET | Freq: Four times a day (QID) | ORAL | Status: DC | PRN

## 2015-10-23 MED ORDER — LITHIUM CARBONATE 300 MG PO CAPS
300.0000 mg | ORAL_CAPSULE | Freq: Two times a day (BID) | ORAL | Status: DC
  Administered 2015-10-24 – 2015-10-25 (×3): 300 mg via ORAL
  Filled 2015-10-23 (×3): qty 1

## 2015-10-23 MED ORDER — ALUM & MAG HYDROXIDE-SIMETH 200-200-20 MG/5ML PO SUSP: 30.0000 mL | ORAL | Status: DC | PRN

## 2015-10-23 MED ORDER — BENZTROPINE MESYLATE 1 MG PO TABS
1.0000 mg | ORAL_TABLET | Freq: Two times a day (BID) | ORAL | Status: DC
  Administered 2015-10-23 – 2015-10-25 (×4): 1 mg via ORAL
  Filled 2015-10-23 (×4): qty 1

## 2015-10-23 MED ORDER — CHARCOAL ACTIVATED PO LIQD
50.0000 g | Freq: Once | ORAL | Status: AC
  Administered 2015-10-23: 50 g via ORAL
  Filled 2015-10-23: qty 240

## 2015-10-23 MED ORDER — OLANZAPINE 10 MG PO TBDP
20.0000 mg | ORAL_TABLET | Freq: Every day | ORAL | Status: DC
  Administered 2015-10-23 – 2015-10-24 (×2): 20 mg via ORAL
  Filled 2015-10-23 (×2): qty 2

## 2015-10-23 MED ORDER — ZIPRASIDONE MESYLATE 20 MG IM SOLR
INTRAMUSCULAR | Status: AC
  Administered 2015-10-23: 10 mg via INTRAMUSCULAR
  Filled 2015-10-23: qty 20

## 2015-10-23 MED ORDER — ZOLPIDEM TARTRATE 5 MG PO TABS: 5.0000 mg | ORAL_TABLET | Freq: Every evening | ORAL | Status: DC | PRN
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
## 2015-10-23 NOTE — ED Notes (Signed)
Pt. Noted sleeping in room. No complaints or concerns voiced. No distress or abnormal behavior noted. Will continue to monitor with security cameras. Q 15 minute rounds continue. 

## 2015-10-23 NOTE — ED Notes (Signed)
Pt. To [facility] from ED ambulatory without difficulty, to room 35 . [facility]. Pt. Is alert and oriented, warm and dry in no distress. Pt. Denies SI, HI, and AVH. Pt. Calm and cooperative. Pt. Made aware of security cameras and Q15 minute rounds. Pt. Encouraged to let Nursing staff know of any concerns or needs.

## 2015-10-23 NOTE — ED Provider Notes (Signed)
CSN: [id number]     Arrival date & time 10/23/15  1648 History   First MD Initiated Contact with Patient 10/23/15 1652     No chief complaint on file.  Philip [name] is a 24 y.o. male with history of bipolar disorder and schizophrenia who presents to the emergency department under IVC with [facility] Department screaming and fighting the officers. The patient tells me that he is scared at home as he is seeing ghosts. He tells me he has not been taking any of his medications recently. Patient is aggressive and fighting with staff. He is a danger to himself and others. He endorses visual hallucinations of ghosts. He denies suicidal or homicidal ideations. He denies any illicit drug use. He denies fevers, coughs, abdominal pain, nausea, vomiting or other physical symptoms.  (Consider location/radiation/quality/duration/timing/severity/associated sxs/prior Treatment) HPI  Past Medical History  Diagnosis Date  . Bipolar affective disorder (HCC)   . Schizophrenia (HCC)   . ADHD (attention deficit hyperactivity disorder)    History reviewed. No pertinent past surgical history. Family History  Problem Relation Age of Onset  . Hypertension Mother    Social History  Substance Use Topics  . Smoking status: Current Every Day Smoker -- 1.00 packs/day for 6 years  . Smokeless tobacco: None  . Alcohol Use: 0.6 oz/week    1 Cans of beer per week    Review of Systems  Constitutional: Negative for fever.  Eyes: Negative for pain.  Cardiovascular: Negative for chest pain.  Gastrointestinal: Negative for vomiting, abdominal pain and diarrhea.  Genitourinary: Negative for dysuria.  Skin: Negative for rash.  Neurological: Negative for headaches.  Psychiatric/Behavioral: Positive for hallucinations and agitation. Negative for suicidal ideas and confusion.      Allergies  Shrimp  Home Medications   Prior to Admission medications   Medication Sig Start Date End Date Taking?  Authorizing Provider  albuterol (PROVENTIL HFA;VENTOLIN HFA) 108 (90 BASE) MCG/ACT inhaler Inhale 2 puffs into the lungs every 6 (six) hours as needed for wheezing or shortness of breath.   Yes Historical Provider, MD  ALPRAZolam Prudy Feeler) 0.5 MG tablet Take 0.5 mg by mouth at bedtime as needed for anxiety.   Yes Historical Provider, MD  amitriptyline (ELAVIL) 25 MG tablet Take 1 tablet (25 mg total) by mouth at bedtime. 01/20/15  Yes Earney Navy, NP  amphetamine-dextroamphetamine (ADDERALL) 20 MG tablet Take 1 tablet (20 mg total) by mouth daily. 01/20/15  Yes Earney Navy, NP  benztropine (COGENTIN) 1 MG tablet Take 1 tablet (1 mg total) by mouth 2 (two) times daily. 01/20/15  Yes Earney Navy, NP  hydrOXYzine (ATARAX/VISTARIL) 25 MG tablet Take 1 tablet (25 mg total) by mouth every 6 (six) hours as needed for anxiety. 09/10/14  Yes Adonis Brook, NP  lithium carbonate 300 MG capsule Take 1 capsule (300 mg total) by mouth 2 (two) times daily with a meal. 01/20/15  Yes Earney Navy, NP  OLANZapine zydis (ZYPREXA) 20 MG disintegrating tablet Take 1 tablet (20 mg total) by mouth at bedtime. 01/20/15  Yes Earney Navy, NP  oxyCODONE-acetaminophen (PERCOCET/ROXICET) 5-325 MG per tablet Take 1 tablet by mouth every 4 (four) hours as needed for severe pain.   Yes Historical Provider, MD  promethazine-codeine (PHENERGAN WITH CODEINE) 6.25-10 MG/5ML syrup Take 5 mLs by mouth every 6 (six) hours as needed for cough.   Yes Historical Provider, MD   BP 145/79 mmHg  Pulse 99  Temp(Src) 98.2 F (36.8 C) (  Oral)  Resp 16  SpO2 100% Physical Exam  Constitutional: He is oriented to person, place, and time. He appears well-developed and well-nourished. No distress.  Nontoxic appearing. Patient is aggressive and physically fighting with staff. He is a danger to himself and others.  HENT:  Head: Normocephalic and atraumatic.  Right Ear: External ear normal.  Left Ear: External ear  normal.  Eyes: Conjunctivae are normal. Pupils are equal, round, and reactive to light. Right eye exhibits no discharge. Left eye exhibits no discharge.  Neck: Neck supple.  Cardiovascular: Normal rate, regular rhythm, normal heart sounds and intact distal pulses.   Pulmonary/Chest: Effort normal and breath sounds normal. No respiratory distress. He has no wheezes. He has no rales.  Abdominal: Soft. There is no tenderness.  Musculoskeletal: Normal range of motion.  Neurological: He is alert and oriented to person, place, and time. Coordination normal.  The patient is alert and oriented 3.  Skin: Skin is warm and dry. No rash noted. He is not diaphoretic. No erythema. No pallor.  Psychiatric: His speech is normal. His mood appears anxious. His affect is labile. He is agitated, aggressive and actively hallucinating. He expresses no homicidal and no suicidal ideation.  The patient is agitated and aggressive. He is fighting with staff. He is a danger to himself and others. Endorses visual hallucinations of "ghosts." He endorses anxiety and being scared as he is scared of these cares. He admits to not taking any of his medications recently.  Nursing note and vitals reviewed.   ED Course  Procedures (including critical care time) Labs Review Labs Reviewed  CBC - Abnormal; Notable for the following:    RBC 4.20 (*)    Hemoglobin 12.6 (*)    HCT 38.9 (*)    All other components within normal limits  URINE RAPID DRUG SCREEN, HOSP PERFORMED - Abnormal; Notable for the following:    Tetrahydrocannabinol POSITIVE (*)    All other components within normal limits  ACETAMINOPHEN LEVEL - Abnormal; Notable for the following:    Acetaminophen (Tylenol), Serum <10 (*)    All other components within normal limits  COMPREHENSIVE METABOLIC PANEL  ETHANOL  SALICYLATE LEVEL    Imaging Review No results found. I have personally reviewed and evaluated these lab results as part of my medical  decision-making.   EKG Interpretation None      Filed Vitals:   10/23/15 1656 10/23/15 1922  BP: 128/82 145/79  Pulse: 84 99  Temp: 98.3 F (36.8 C) 98.2 F (36.8 C)  TempSrc: Oral Oral  Resp: 18 16  SpO2: 100% 100%     MDM   Meds given in ED:  Medications  ziprasidone (GEODON) injection 10 mg (0 mg Intramuscular Hold 10/23/15 1814)  alum & mag hydroxide-simeth (MAALOX/MYLANTA) 200-200-20 MG/5ML suspension 30 mL (not administered)  ondansetron (ZOFRAN) tablet 4 mg (not administered)  zolpidem (AMBIEN) tablet 5 mg (not administered)  acetaminophen (TYLENOL) tablet 650 mg (not administered)  LORazepam (ATIVAN) tablet 1 mg (not administered)  albuterol (PROVENTIL HFA;VENTOLIN HFA) 108 (90 BASE) MCG/ACT inhaler 2 puff (not administered)  ALPRAZolam (XANAX) tablet 0.5 mg (not administered)  benztropine (COGENTIN) tablet 1 mg (1 mg Oral Given 10/23/15 2128)  hydrOXYzine (ATARAX/VISTARIL) tablet 25 mg (not administered)  lithium carbonate capsule 300 mg (not administered)  OLANZapine zydis (ZYPREXA) disintegrating tablet 20 mg (20 mg Oral Given 10/23/15 2128)  ziprasidone (GEODON) injection 10 mg (10 mg Intramuscular Given 10/23/15 1716)  charcoal activated (NO SORBITOL) (ACTIDOSE-AQUA) suspension 50 g (  50 g Oral Given 10/23/15 1700)  sterile water (preservative free) injection (1 mL  Given 10/23/15 1817)    New Prescriptions   No medications on file    Final diagnoses:  Agitation  Suicidal ideations    This  is a 24 y.o. male with history of bipolar disorder and schizophrenia who [facility] the [facility] department [facility] screaming and fighting the officers. The patient tells me that he is scared at home as he is seeing ghosts. He tells me he has not been taking any of his medications recently. Patient is aggressive and fighting with staff. He is a danger to himself and others. After discussing with my attending the patient was  chemically restrained Geodon. Patient had improvement after Geodon.  Later it was also revealed that the patient is expressing suicidal ideations with police. He denied this with me. Urine drug screen was positive for THC and is otherwise unremarkable. Blood work is otherwise unremarkable. Behavioral Health reports patient meets inpatient criteria. Patient awaiting bed placement.   CRITICAL CARE Performed by: Lawana Chambersansie,Eula Jaster Duncan   Total critical care time: 35 minutes  Critical care time was exclusive of separately billable procedures and treating other patients.  Critical care was necessary to treat or prevent imminent or life-threatening deterioration.  Critical care was time spent personally by me on the following activities: development of treatment plan with patient and/or surrogate as well as nursing, discussions with consultants, evaluation of patient's response to treatment, examination of patient, obtaining history from patient or surrogate, ordering and performing treatments and interventions, ordering and review of laboratory studies, ordering and review of radiographic studies, pulse oximetry and re-evaluation of patient's condition.    Everlene FarrierWilliam Kimila Papaleo, PA-C 10/24/15 0125  Jerelyn ScottMartha Linker, MD 10/26/15 [phone]

## 2015-10-23 NOTE — BH Assessment (Signed)
Assessment completed.  Consulted with Nanine MeansJamison Lord, DNP who recommends inpatient treatment at this time.   Davina PokeJoVea Kenika Sahm, LCSW Therapeutic Triage Specialist [facility] 10/23/2015 6:01 PM

## 2015-10-24 DIAGNOSIS — F1994 Other psychoactive substance use, unspecified with psychoactive substance-induced mood disorder: Secondary | ICD-10-CM | POA: Diagnosis not present

## 2015-10-24 MED ORDER — STERILE WATER FOR INJECTION IJ SOLN
INTRAMUSCULAR | Status: AC
  Filled 2015-10-24: qty 10

## 2015-10-24 MED ORDER — ZIPRASIDONE MESYLATE 20 MG IM SOLR
10.0000 mg | Freq: Once | INTRAMUSCULAR | Status: AC
  Administered 2015-10-24: 10 mg via INTRAMUSCULAR
  Filled 2015-10-24: qty 20

## 2015-10-24 MED ORDER — OLANZAPINE 10 MG PO TBDP
10.0000 mg | ORAL_TABLET | Freq: Once | ORAL | Status: AC | PRN
Start: 2015-10-24 — End: 2015-10-24
  Administered 2015-10-24: 10 mg via ORAL
  Filled 2015-10-24: qty 1

## 2015-10-24 MED ORDER — DIPHENHYDRAMINE HCL 25 MG PO CAPS: 50.0000 mg | ORAL_CAPSULE | Freq: Once | ORAL | Status: DC | PRN

## 2015-10-24 MED ORDER — DIPHENHYDRAMINE HCL 50 MG/ML IJ SOLN
50.0000 mg | Freq: Once | INTRAMUSCULAR | Status: AC
Start: 2015-10-24 — End: 2015-10-24
  Administered 2015-10-24: 50 mg via INTRAMUSCULAR
  Filled 2015-10-24: qty 1

## 2015-10-24 MED ORDER — OLANZAPINE 10 MG IM SOLR: 10.0000 mg | Freq: Once | INTRAMUSCULAR | Status: DC | PRN

## 2015-10-24 MED ORDER — ZIPRASIDONE MESYLATE 20 MG IM SOLR: 20.0000 mg | Freq: Once | INTRAMUSCULAR | Status: DC

## 2015-10-24 NOTE — Consult Note (Signed)
[facility] Consult   Reason for Consult:  Substance abuse, paranoia Referring Physician:  EDP Patient Identification: Philip [name] MRN:  [id number] Principal Diagnosis: Substance induced mood disorder ([place]) Diagnosis:   Patient Active Problem List   Diagnosis Date Noted  . Polysubstance abuse [F19.10] 09/16/2014    Priority: High  . Substance induced mood disorder ([place]) [F19.94] 09/16/2014    Priority: High  . Schizoaffective disorder, bipolar type ([place]) [F25.0]     Priority: High  . Agitation [R45.1] 09/16/2014  . Cannabis use disorder, severe, dependence ([place]) [F12.20]   . Hyperammonemia ([place]) [E72.20] 09/07/2014  . Psychotic disorder [F29] 09/01/2014    Total Time spent with patient: 45 minutes  Subjective:   Philip [name] is a 24 y.o. male patient admitted with drug induced psychosis.  HPI:  On admission:  24 y.o. male who presents to [facility] under IVC with [facility]. IVC states: "[name] suffers from Bipolar and ADHD. [name] states that "he didn't want to live anymore" and began pacing around with a knife. While walking around with the knife he states "I'm going to kill them all." He also stated that "he can't take it anymore." He locked himself into the bathroom and began hitting the wall several times. Mr. Ferron states "that he was going to win this time."   Contacted the patients mother, the petitioner, [name] at [phone] who states that; "he hadn't been himself all day today, this morning, I heard noises in the living room at like three o'clock in the morning and he was butt naked vacuuming the living room." She states that he was paranoid stating "they think they gone get me but I'm ready, they not gone get me this time." She states that he calmed down and went to sleep and when she woke him up he continued to say "they think they gone get me but I'm ready for them, I'm ready." She states that he would not answer who was after him when asked.  Patients mother states that he told her to "get out" and he "had a knife and said I'mma kill everybody, you need to get out" and when she asked who he was talking to he said "you, get out of my house, I don't care about living anymore, they [place] get me but I'mma get them first." Then she states that patient took all of his clothes and said "I'm hot, they coming to get me, I will get them first." She states that she has never observed the patient behave this way and called the police. She states that the patient locked himself in the bathroom and flooded her bathroom. She states the patient "put all my decorations in the tub and filled the tub up with water and soap and then said they not gonna get me, I'm not gonna let that happen." She states that this behavior is not normal for him and states that she feels that he may have taken drugs today. She states that he was in jail for about one year or two and was diagnosed with "ADHD and Bipolar." She states that when he was released he followed up with [facility] for medication management. She states that he has been out of jail since August and last went to [facility] sometime last week, she thinks, but states that she has not seen him taking his medications. Patients mother states that patient has not taken his medication prescribed by [facility] but she is not sure what he  has been prescribed.   Today:  Patient presents as if he is still under the influence.  He states he is hearing voices and spirits.  Denies suicidal/homicidal ideations.  He is well known to this ED and provider.  [place] will start to have cravings and his agitation will increase.  Agitated at this time and will need PRN Zyprexa.  Past Psychiatric History: schizoaffective disorder, polysubstance abuse  Risk to Self: Suicidal Ideation: No Suicidal Intent:  (UTA) Is patient at risk for suicide?:  (UTA) Suicidal Plan?:  (UTA) Access to Means:  (UTA) How many times?:  (UTA) Other Self Harm Risks:   (UTA) Triggers for Past Attempts:  (UTA) Intentional Self Injurious Behavior:  (UTA) Risk to Others: Homicidal Ideation: No Thoughts of Harm to Others:  (UTA) Current Homicidal Intent:  (UTA) Current Homicidal Plan:  (UTA) Access to Homicidal Means:  (UTA) Identified Victim:  (UTA) History of harm to others?:  (UTA) Assessment of Violence:  (UTA) Violent Behavior Description:  (UTA) Does patient have access to weapons?:  (UTA) Criminal Charges Pending?:  (UTA) 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  : Unknown Does patient have [facility] services? : Yes Does patient have P4CC services?: Unknown  Past Medical History:  Past Medical History  Diagnosis Date  . Bipolar affective disorder (HCC)   . Schizophrenia (HCC)   . ADHD (attention deficit hyperactivity disorder)    History reviewed. No pertinent past surgical history. Family History:  Family History  Problem Relation Age of Onset  . Hypertension Mother    Family Psychiatric  History: Unknown Social History:  History  Alcohol Use  . 0.6 oz/week  . 1 Cans of beer per week     History  Drug Use  . Yes  . Special: Marijuana    Social History   Social History  . Marital Status: Married    Spouse Name: N/A  . Number of Children: N/A  . Years of Education: N/A   Social History Main Topics  . Smoking status: Current Every Day Smoker -- 1.00 packs/day for 6 years  . Smokeless tobacco: None  . Alcohol Use: 0.6 oz/week    1 Cans of beer per week  . Drug Use: Yes    Special: Marijuana  . Sexual Activity: Not Asked   Other Topics  Concern  . None   Social History Narrative   Additional Social History:    Pain Medications: See PTA Prescriptions: See PTA Over the Counter: See PTA History of alcohol / drug use?: Yes Name of Substance 1: THC 1 - Age of First Use: 13 Name of Substance 2: Ecstacy Name of Substance 3: Alcohol               Allergies:   Allergies  Allergen Reactions  . Shrimp [Shellfish Allergy] Anaphylaxis    Labs:  Results for orders placed or performed during the hospital encounter of 10/23/15 (from the past 48 hour(s))  CBC     Status: Abnormal   Collection Time: 10/23/15  5:16 PM  Result Value Ref Range   WBC 5.3 4.0 - 10.5 K/uL   RBC 4.20 (L) 4.22 - 5.81 MIL/uL   Hemoglobin 12.6 (L) 13.0 - 17.0  g/dL   HCT 38.9 (L) 39.0 - 52.0 %   MCV 92.6 78.0 - 100.0 fL   MCH 30.0 26.0 - 34.0 pg   MCHC 32.4 30.0 - 36.0 g/dL   RDW 12.9 11.5 - 15.5 %   Platelets 251 150 - 400 K/uL  Comprehensive metabolic panel     Status: None   Collection Time: 10/23/15  5:16 PM  Result Value Ref Range   Sodium 143 135 - 145 mmol/L   Potassium 4.0 3.5 - 5.1 mmol/L   Chloride 107 101 - 111 mmol/L   CO2 29 22 - 32 mmol/L   Glucose, Bld 85 65 - 99 mg/dL   BUN 10 6 - 20 mg/dL   Creatinine, Ser 1.11 0.61 - 1.24 mg/dL   Calcium 9.4 8.9 - 10.3 mg/dL   Total Protein 7.0 6.5 - 8.1 g/dL   Albumin 4.1 3.5 - 5.0 g/dL   AST 33 15 - 41 U/L   ALT 36 17 - 63 U/L   Alkaline Phosphatase 81 38 - 126 U/L   Total Bilirubin 0.6 0.3 - 1.2 mg/dL   GFR calc non Af Amer >60 >60 mL/min   GFR calc Af Amer >60 >60 mL/min    Comment: (NOTE) The eGFR has been calculated using the CKD EPI equation. This calculation has not been validated in all clinical situations. eGFR's persistently <60 mL/min signify possible Chronic Kidney Disease.    Anion gap 7 5 - 15  Ethanol     Status: None   Collection Time: 10/23/15  5:16 PM  Result Value Ref Range   Alcohol, Ethyl (B) <5 <5 mg/dL    Comment:        LOWEST DETECTABLE LIMIT  FOR SERUM ALCOHOL IS 5 mg/dL FOR MEDICAL PURPOSES ONLY   Acetaminophen level     Status: Abnormal   Collection Time: 10/23/15  5:16 PM  Result Value Ref Range   Acetaminophen (Tylenol), Serum <10 (L) 10 - 30 ug/mL    Comment:        THERAPEUTIC CONCENTRATIONS VARY SIGNIFICANTLY. A RANGE OF 10-30 ug/mL MAY BE AN EFFECTIVE CONCENTRATION FOR MANY PATIENTS. HOWEVER, SOME ARE BEST TREATED AT CONCENTRATIONS OUTSIDE THIS RANGE. ACETAMINOPHEN CONCENTRATIONS >150 ug/mL AT 4 HOURS AFTER INGESTION AND >50 ug/mL AT 12 HOURS AFTER INGESTION ARE OFTEN ASSOCIATED WITH TOXIC REACTIONS.   Salicylate level     Status: None   Collection Time: 10/23/15  5:16 PM  Result Value Ref Range   Salicylate Lvl <8.6 2.8 - 30.0 mg/dL  Urine rapid drug screen (hosp performed)     Status: Abnormal   Collection Time: 10/23/15  6:14 PM  Result Value Ref Range   Opiates NONE DETECTED NONE DETECTED   Cocaine NONE DETECTED NONE DETECTED   Benzodiazepines NONE DETECTED NONE DETECTED   Amphetamines NONE DETECTED NONE DETECTED   Tetrahydrocannabinol POSITIVE (A) NONE DETECTED   Barbiturates NONE DETECTED NONE DETECTED    Comment:        DRUG SCREEN FOR MEDICAL PURPOSES ONLY.  IF CONFIRMATION IS NEEDED FOR ANY PURPOSE, NOTIFY LAB WITHIN 5 DAYS.        LOWEST DETECTABLE LIMITS FOR URINE DRUG SCREEN Drug Class       Cutoff (ng/mL) Amphetamine      1000 Barbiturate      200 Benzodiazepine   381 Tricyclics       771 Opiates          300 Cocaine  300 THC              50     Current Facility-Administered Medications  Medication Dose Route Frequency Provider Last Rate Last Dose  . acetaminophen (TYLENOL) tablet 650 mg  650 mg Oral Q4H PRN Waynetta Pean, PA-C      . albuterol (PROVENTIL HFA;VENTOLIN HFA) 108 (90 BASE) MCG/ACT inhaler 2 puff  2 puff Inhalation Q6H PRN Waynetta Pean, PA-C      . alum & mag hydroxide-simeth (MAALOX/MYLANTA) 200-200-20 MG/5ML suspension 30 mL  30 mL Oral PRN Waynetta Pean, PA-C      . benztropine (COGENTIN) tablet 1 mg  1 mg Oral BID Waynetta Pean, PA-C   1 mg at 10/23/15 2128  . hydrOXYzine (ATARAX/VISTARIL) tablet 25 mg  25 mg Oral Q6H PRN Waynetta Pean, PA-C      . lithium carbonate capsule 300 mg  300 mg Oral BID WC Waynetta Pean, PA-C   300 mg at 10/24/15 0851  . OLANZapine zydis (ZYPREXA) disintegrating tablet 20 mg  20 mg Oral QHS Waynetta Pean, PA-C   20 mg at 10/23/15 2128  . ondansetron (ZOFRAN) tablet 4 mg  4 mg Oral Q8H PRN Waynetta Pean, PA-C      . ziprasidone (GEODON) injection 10 mg  10 mg Intramuscular Once Waynetta Pean, PA-C   Stopped at 10/23/15 1814   Current Outpatient Prescriptions  Medication Sig Dispense Refill  . albuterol (PROVENTIL HFA;VENTOLIN HFA) 108 (90 BASE) MCG/ACT inhaler Inhale 2 puffs into the lungs every 6 (six) hours as needed for wheezing or shortness of breath.    . ALPRAZolam (XANAX) 0.5 MG tablet Take 0.5 mg by mouth at bedtime as needed for anxiety.    Marland Kitchen amitriptyline (ELAVIL) 25 MG tablet Take 1 tablet (25 mg total) by mouth at bedtime. 30 tablet 0  . amphetamine-dextroamphetamine (ADDERALL) 20 MG tablet Take 1 tablet (20 mg total) by mouth daily. 14 tablet 0  . benztropine (COGENTIN) 1 MG tablet Take 1 tablet (1 mg total) by mouth 2 (two) times daily. 60 tablet 0  . hydrOXYzine (ATARAX/VISTARIL) 25 MG tablet Take 1 tablet (25 mg total) by mouth every 6 (six) hours as needed for anxiety. 30 tablet 0  . lithium carbonate 300 MG capsule Take 1 capsule (300 mg total) by mouth 2 (two) times daily with a meal. 60 capsule 0  . OLANZapine zydis (ZYPREXA) 20 MG disintegrating tablet Take 1 tablet (20 mg total) by mouth at bedtime. 30 tablet 0  . oxyCODONE-acetaminophen (PERCOCET/ROXICET) 5-325 MG per tablet Take 1 tablet by mouth every 4 (four) hours as needed for severe pain.    . promethazine-codeine (PHENERGAN WITH CODEINE) 6.25-10 MG/5ML syrup Take 5 mLs by mouth every 6 (six) hours as needed for cough.       Musculoskeletal: Strength & Muscle Tone: within normal limits Gait & Station: normal Patient leans: N/A  Psychiatric Specialty Exam: Review of Systems  Constitutional: Negative.   HENT: Negative.   Eyes: Negative.   Respiratory: Negative.   Cardiovascular: Negative.   Gastrointestinal: Negative.   Genitourinary: Negative.   Musculoskeletal: Negative.   Skin: Negative.   Neurological: Negative.   Endo/Heme/Allergies: Negative.   Psychiatric/Behavioral: Positive for depression, hallucinations and substance abuse. The patient is nervous/anxious.     Blood pressure 145/79, pulse 99, temperature 98.2 F (36.8 C), temperature source Oral, resp. rate 16, SpO2 100 %.There is no weight on file to calculate BMI.  General Appearance: Casual  Eye Contact::  Fair  Speech:  Normal Rate  Volume:  Normal  Mood:  Anxious and Irritable  Affect:  Congruent  Thought Process:  Coherent  Orientation:  Full (Time, Place, and Person)  Thought Content:  Hallucinations: Auditory Visual  Suicidal Thoughts:  No  Homicidal Thoughts:  No  Memory:  Immediate;   Fair Recent;   Fair Remote;   Fair  Judgement:  Impaired  Insight:  Lacking  Psychomotor Activity:  Normal  Concentration:  Fair  Recall:  [place]  Language: Fair  Akathisia:  No  Handed:  Right  AIMS (if indicated):     Assets:  Housing Leisure Time Physical Health Resilience Social Support  ADL's:  Intact  Cognition: WNL  Sleep:      Treatment Plan Summary: Daily contact with patient to assess and evaluate symptoms and progress in treatment, Medication management and Plan drug induced psychosis:  -Crisis stabilization -Medication management:  Zyprexa 20 mg at bedtime for psychosis and Lithium 300 mg BID for mood stability started.  PRN Zyprexa 10 mg and Benadryl 50 mg PRN for agitation given.  Vistaril 50 mg every 6 hours PRN anxiety. -Individual and substance abuse counseling  Disposition: Recommend  psychiatric Inpatient admission when medically cleared.  Waylan Boga, [place] 10/24/2015 10:39 AM   Patient seen face to face for psychiatric evaluation. Chart reviewed and finding discussed with Physician extender. Agreed with disposition and treatment plan.   Berniece Andreas, MD

## 2015-10-24 NOTE — ED Notes (Signed)
Pt. Noted sleeping in room. No complaints or concerns voiced. No distress or abnormal behavior noted. Will continue to monitor with security cameras. Q 15 minute rounds continue. 

## 2015-10-24 NOTE — ED Notes (Signed)
Pt. Noted in hall. No complaints or concerns voiced. No distress or abnormal behavior noted. Will continue to monitor with security cameras. Q 15 minute rounds continue. 

## 2015-10-24 NOTE — ED Notes (Signed)
Pt becoming more agitated when he was told he wasn't being discharged.  He threatened to not take his prn when offered but we were able to convince him to take it.Marland Kitchen.  He is irritable and guarded.  15 minute checks and video monitoring continue.

## 2015-10-24 NOTE — B[facility] Counselor (Signed)
10/24/15  Referral sent to [place], [place], [address] [place], [place], [place], [place], [address], [place], [place], [place], [place], [place], [place], [place],and [facility]   Cohick K. Jesus GeneraHarris,  LPC-A, [facility]  Counselor 10/24/2015 12:16 PM

## 2015-10-24 NOTE — ED Notes (Signed)
[facility]. Pt. Sleeping. Respirations regular and unlabored.  Will continue to monitor for safety via security cameras and Q 15 minute checks.

## 2015-10-25 DIAGNOSIS — F1994 Other psychoactive substance use, unspecified with psychoactive substance-induced mood disorder: Secondary | ICD-10-CM | POA: Diagnosis not present

## 2015-10-25 NOTE — [facility] Suicide Risk Assessment (Signed)
Suicide Risk Assessment  Discharge Assessment   [facility]   Demographic Factors:  Male, Adolescent or young adult and Gay, lesbian, or bisexual orientation  Total Time spent with patient: 30 minutes   Musculoskeletal: Strength & Muscle Tone: within normal limits Gait & Station: normal Patient leans: N/A  Psychiatric Specialty Exam: Review of Systems  Constitutional: Negative.   HENT: Negative.   Eyes: Negative.   Respiratory: Negative.   Cardiovascular: Negative.   Gastrointestinal: Negative.   Genitourinary: Negative.   Musculoskeletal: Negative.   Skin: Negative.   Neurological: Negative.   Endo/Heme/Allergies: Negative.   Psychiatric/Behavioral: Positive for depression, hallucinations and substance abuse. The patient is nervous/anxious.     Blood pressure 134/78, pulse 76, temperature 98.8 F (37.1 C), temperature source Oral, resp. rate 16, SpO2 100 %.There is no weight on file to calculate BMI.  General Appearance: Casual  Eye Contact::  Good  Speech:  Normal Rate  Volume:  Normal  Mood:  Euthymic  Affect:  Congruent  Thought Process:  Coherent  Orientation:  Full (Time, Place, and Person)  Thought Content:  WDL  Suicidal Thoughts:  No  Homicidal Thoughts:  No  Memory:  Immediate, good; recent, good; remote, good  Judgement:  Fair  Insight:  Fair  Psychomotor Activity:  Normal  Concentration:  Good  Recall:  Good  Fund of Knowledge: Good  Language: Good  Akathisia:  No  Handed:  Right  AIMS (if indicated):     Assets:  Housing Leisure Time Physical Health Resilience Social Support  ADL's:  Intact  Cognition: WNL  Sleep:         Has this patient used any form of tobacco in the last 30 days? (Cigarettes, Smokeless Tobacco, Cigars, and/or Pipes) Yes, A prescription for an FDA-approved tobacco cessation medication was offered at discharge and the patient refused  Mental Status Per Nursing Assessment::   On Admission:    Paranoia, agitation  Current Mental Status by Physician: NA  Loss Factors: NA  Historical Factors: NA  Risk Reduction Factors:   Responsible for children under 26 years of age, Sense of responsibility to family, Employed, Living with another person, especially a relative, Positive social support and Positive therapeutic relationship  Continued Clinical Symptoms:  None  Cognitive Features That Contribute To Risk:  None    Suicide Risk:  Minimal: No identifiable suicidal ideation.  Patients presenting with no risk factors but with morbid ruminations; may be classified as minimal risk based on the severity of the depressive symptoms  Principal Problem: Substance induced mood disorder [facility](HCC) Discharge Diagnoses:  Patient Active Problem List   Diagnosis Date Noted  . Polysubstance abuse [F19.10] 09/16/2014    Priority: High  . Substance induced mood disorder (HCC) [F19.94] 09/16/2014    Priority: High  . Schizoaffective disorder, bipolar type (HCC) [F25.0]     Priority: High  . Agitation [R45.1] 09/16/2014  . Cannabis use disorder, severe, dependence (HCC) [F12.20]   . Hyperammonemia (HCC) [E72.20] 09/07/2014  . Psychotic disorder [F29] 09/01/2014      Plan Of Care/Follow-up recommendations:  Activity:  as tolerated Diet:  heart healthy diet  Is patient on multiple antipsychotic therapies at discharge:  No   Has Patient had three or more failed trials of antipsychotic monotherapy by history:  No  Recommended Plan for Multiple Antipsychotic Therapies: NA    LORD, JAMISON, PMH-NP 10/25/2015, 10:26 AM

## 2015-10-25 NOTE — ED Notes (Signed)
Pt. Noted in room. No complaints or concerns voiced. No distress or abnormal behavior noted. Will continue to monitor with security cameras. Q 15 minute rounds continue. 

## 2015-10-25 NOTE — ED Notes (Signed)
Pt. Noted sleeping in room. No complaints or concerns voiced. No distress or abnormal behavior noted. Will continue to monitor with security cameras. Q 15 minute rounds continue. 

## 2015-10-25 NOTE — Consult Note (Signed)
[name] Consult   Reason for Consult:  Substance abuse, paranoia Referring Physician:  EDP Patient Identification: Philip [name] MRN:  [id number] Principal Diagnosis: Substance induced mood disorder ([place]) Diagnosis:   Patient Active Problem List   Diagnosis Date Noted  . Polysubstance abuse [F19.10] 09/16/2014    Priority: High  . Substance induced mood disorder ([place]) [F19.94] 09/16/2014    Priority: High  . Schizoaffective disorder, bipolar type ([place]) [F25.0]     Priority: High  . Agitation [R45.1] 09/16/2014  . Cannabis use disorder, severe, dependence ([place]) [F12.20]   . Hyperammonemia ([place]) [E72.20] 09/07/2014  . Psychotic disorder [F29] 09/01/2014    Total Time spent with patient: 30 minutes  Subjective:   Philip [name] is a 24 y.o. male patient does not warrant admission.  HPI:  On admission:  24 y.o. male who presents to [facility] under IVC with [facility]. IVC states: "[name] suffers from Bipolar and ADHD. [name] states that "he didn't want to live anymore" and began pacing around with a knife. While walking around with the knife he states "I'm going to kill them all." He also stated that "he can't take it anymore." He locked himself into the bathroom and began hitting the wall several times. [name] states "that he was going to win this time."   Contacted the patients mother, the petitioner, Philip [name] at [phone] who states that; "he hadn't been himself all day today, this morning, I heard noises in the living room at like three o'clock in the morning and he was butt naked vacuuming the living room." She states that he was paranoid stating "they think they gone get me but I'm ready, they not gone get me this time." She states that he calmed down and went to sleep and when she woke him up he continued to say "they think they gone get me but I'm ready for them, I'm ready." She states that he would not answer who was after him when asked. Patients  mother states that he told her to "get out" and he "had a knife and said I'mma kill everybody, you need to get out" and when she asked who he was talking to he said "you, get out of my house, I don't care about living anymore, they [place] get me but I'mma get them first." Then she states that patient took all of his clothes and said "I'm hot, they coming to get me, I will get them first." She states that she has never observed the patient behave this way and called the police. She states that the patient locked himself in the bathroom and flooded her bathroom. She states the patient "put all my decorations in the tub and filled the tub up with water and soap and then said they not gonna get me, I'm not gonna let that happen." She states that this behavior is not normal for him and states that she feels that he may have taken drugs today. She states that he was in jail for about one year or two and was diagnosed with "ADHD and Bipolar." She states that when he was released he followed up with [facility] for medication management. She states that he has been out of jail since August and last went to [place] sometime last week, she thinks, but states that she has not seen him taking his medications. Patients mother states that patient has not taken his medication prescribed by [facility] but she is not sure what he has  been prescribed.   Today:  Patient has been stable since his PRN medications yesterday.  He is clear, coherent, calm, and cooperative.  Denies suicidal/homicidal ideations, hallucinations, and withdrawal symptoms.  Stable for discharge and follow-up with his regular providers at [facility].  Past Psychiatric History: schizoaffective disorder, polysubstance abuse  Risk to Self: Suicidal Ideation: No Suicidal Intent:  (UTA) Is patient at risk for suicide?:  (UTA) Suicidal Plan?:  (UTA) Access to Means:  (UTA) How many times?:  (UTA) Other Self Harm Risks:  (UTA) Triggers for Past Attempts:   (UTA) Intentional Self Injurious Behavior:  (UTA) Risk to Others: Homicidal Ideation: No Thoughts of Harm to Others:  (UTA) Current Homicidal Intent:  (UTA) Current Homicidal Plan:  (UTA) Access to Homicidal Means:  (UTA) Identified Victim:  (UTA) History of harm to others?:  (UTA) Assessment of Violence:  (UTA) Violent Behavior Description:  (UTA) Does patient have access to weapons?:  (UTA) Criminal Charges Pending?:  (UTA) Does patient have a court date:  Special educational needs teacher) Prior Inpatient Therapy: Prior Inpatient Therapy: Yes Prior Therapy Dates: Various Prior Therapy Facilty/Provider(s): [facility] Reason for Treatment: Schizoaffective Disorder Prior Outpatient Therapy: Prior Outpatient Therapy: Yes Prior Therapy Dates: ongoing Prior Therapy Facilty/Provider(s): [facility] Does patient have an ACCT team?: Unknown Does patient have Intensive In-House Services?  : Unknown Does patient have [facility] services? : Yes Does patient have P4CC services?: Unknown  Past Medical History:  Past Medical History  Diagnosis Date  . Bipolar affective disorder ([place])   . Schizophrenia ([place])   . ADHD (attention deficit hyperactivity disorder)    History reviewed. No pertinent past surgical history. Family History:  Family History  Problem Relation Age of Onset  . Hypertension Mother    Family Psychiatric  History: Unknown Social History:  History  Alcohol Use  . 0.6 oz/week  . 1 Cans of beer per week     History  Drug Use  . Yes  . Special: Marijuana    Social History   Social History  . Marital Status: Married    Spouse Name: N/A  . Number of Children: N/A  . Years of Education: N/A   Social History Main Topics  . Smoking status: Current Every Day Smoker -- 1.00 packs/day for 6 years  . Smokeless tobacco: None  . Alcohol Use: 0.6 oz/week    1 Cans of beer per week  . Drug Use: Yes    Special: Marijuana  . Sexual Activity: Not Asked   Other Topics Concern  . None   Social History  Narrative   Additional Social History:    Pain Medications: See PTA Prescriptions: See PTA Over the Counter: See PTA History of alcohol / drug use?: Yes Name of Substance 1: THC 1 - Age of First Use: 13 Name of Substance 2: [place] Name of Substance 3: Alcohol               Allergies:   Allergies  Allergen Reactions  . Shrimp [Shellfish Allergy] Anaphylaxis    Labs:  Results for orders placed or performed during the hospital encounter of 10/23/15 (from the past 48 hour(s))  CBC     Status: Abnormal   Collection Time: 10/23/15  5:16 PM  Result Value Ref Range   WBC 5.3 4.0 - 10.5 K/uL   RBC 4.20 (L) 4.22 - 5.81 MIL/uL   Hemoglobin 12.6 (L) 13.0 - 17.0 g/dL   HCT 38.9 (L) 39.0 - 52.0 %   MCV 92.6 78.0 - 100.0 fL  MCH 30.0 26.0 - 34.0 pg   MCHC 32.4 30.0 - 36.0 g/dL   RDW 12.9 11.5 - 15.5 %   Platelets 251 150 - 400 K/uL  Comprehensive metabolic panel     Status: None   Collection Time: 10/23/15  5:16 PM  Result Value Ref Range   Sodium 143 135 - 145 mmol/L   Potassium 4.0 3.5 - 5.1 mmol/L   Chloride 107 101 - 111 mmol/L   CO2 29 22 - 32 mmol/L   Glucose, Bld 85 65 - 99 mg/dL   BUN 10 6 - 20 mg/dL   Creatinine, Ser 1.11 0.61 - 1.24 mg/dL   Calcium 9.4 8.9 - 10.3 mg/dL   Total Protein 7.0 6.5 - 8.1 g/dL   Albumin 4.1 3.5 - 5.0 g/dL   AST 33 15 - 41 U/L   ALT 36 17 - 63 U/L   Alkaline Phosphatase 81 38 - 126 U/L   Total Bilirubin 0.6 0.3 - 1.2 mg/dL   GFR calc non Af Amer >60 >60 mL/min   GFR calc Af Amer >60 >60 mL/min    Comment: (NOTE) The eGFR has been calculated using the CKD EPI equation. This calculation has not been validated in all clinical situations. eGFR's persistently <60 mL/min signify possible Chronic Kidney Disease.    Anion gap 7 5 - 15  Ethanol     Status: None   Collection Time: 10/23/15  5:16 PM  Result Value Ref Range   Alcohol, Ethyl (B) <5 <5 mg/dL    Comment:        LOWEST DETECTABLE LIMIT FOR SERUM ALCOHOL IS 5 mg/dL FOR  MEDICAL PURPOSES ONLY   Acetaminophen level     Status: Abnormal   Collection Time: 10/23/15  5:16 PM  Result Value Ref Range   Acetaminophen (Tylenol), Serum <10 (L) 10 - 30 ug/mL    Comment:        THERAPEUTIC CONCENTRATIONS VARY SIGNIFICANTLY. A RANGE OF 10-30 ug/mL MAY BE AN EFFECTIVE CONCENTRATION FOR MANY PATIENTS. HOWEVER, SOME ARE BEST TREATED AT CONCENTRATIONS OUTSIDE THIS RANGE. ACETAMINOPHEN CONCENTRATIONS >150 ug/mL AT 4 HOURS AFTER INGESTION AND >50 ug/mL AT 12 HOURS AFTER INGESTION ARE OFTEN ASSOCIATED WITH TOXIC REACTIONS.   Salicylate level     Status: None   Collection Time: 10/23/15  5:16 PM  Result Value Ref Range   Salicylate Lvl <7.7 2.8 - 30.0 mg/dL  Urine rapid drug screen (hosp performed)     Status: Abnormal   Collection Time: 10/23/15  6:14 PM  Result Value Ref Range   Opiates NONE DETECTED NONE DETECTED   Cocaine NONE DETECTED NONE DETECTED   Benzodiazepines NONE DETECTED NONE DETECTED   Amphetamines NONE DETECTED NONE DETECTED   Tetrahydrocannabinol POSITIVE (A) NONE DETECTED   Barbiturates NONE DETECTED NONE DETECTED    Comment:        DRUG SCREEN FOR MEDICAL PURPOSES ONLY.  IF CONFIRMATION IS NEEDED FOR ANY PURPOSE, NOTIFY LAB WITHIN 5 DAYS.        LOWEST DETECTABLE LIMITS FOR URINE DRUG SCREEN Drug Class       Cutoff (ng/mL) Amphetamine      1000 Barbiturate      200 Benzodiazepine   939 Tricyclics       030 Opiates          300 Cocaine          300 THC              50  Current Facility-Administered Medications  Medication Dose Route Frequency Provider Last Rate Last Dose  . acetaminophen (TYLENOL) tablet 650 mg  650 mg Oral Q4H PRN Waynetta Pean, PA-C      . albuterol (PROVENTIL HFA;VENTOLIN HFA) 108 (90 BASE) MCG/ACT inhaler 2 puff  2 puff Inhalation Q6H PRN Waynetta Pean, PA-C      . alum & mag hydroxide-simeth (MAALOX/MYLANTA) 200-200-20 MG/5ML suspension 30 mL  30 mL Oral PRN Waynetta Pean, PA-C      . benztropine  (COGENTIN) tablet 1 mg  1 mg Oral BID Waynetta Pean, PA-C   1 mg at 10/25/15 0753  . diphenhydrAMINE (BENADRYL) capsule 50 mg  50 mg Oral Once PRN Patrecia Pour, NP      . hydrOXYzine (ATARAX/VISTARIL) tablet 25 mg  25 mg Oral Q6H PRN Waynetta Pean, PA-C      . lithium carbonate capsule 300 mg  300 mg Oral BID WC Waynetta Pean, PA-C   300 mg at 10/25/15 0753  . OLANZapine zydis (ZYPREXA) disintegrating tablet 20 mg  20 mg Oral QHS Patrecia Pour, NP   20 mg at 10/24/15 2106  . ondansetron (ZOFRAN) tablet 4 mg  4 mg Oral Q8H PRN Waynetta Pean, PA-C      . ziprasidone (GEODON) injection 20 mg  20 mg Intramuscular Once Patrecia Pour, NP   20 mg at 10/24/15 1437   Current Outpatient Prescriptions  Medication Sig Dispense Refill  . albuterol (PROVENTIL HFA;VENTOLIN HFA) 108 (90 BASE) MCG/ACT inhaler Inhale 2 puffs into the lungs every 6 (six) hours as needed for wheezing or shortness of breath.    . ALPRAZolam (XANAX) 0.5 MG tablet Take 0.5 mg by mouth at bedtime as needed for anxiety.    Marland Kitchen amitriptyline (ELAVIL) 25 MG tablet Take 1 tablet (25 mg total) by mouth at bedtime. 30 tablet 0  . amphetamine-dextroamphetamine (ADDERALL) 20 MG tablet Take 1 tablet (20 mg total) by mouth daily. 14 tablet 0  . benztropine (COGENTIN) 1 MG tablet Take 1 tablet (1 mg total) by mouth 2 (two) times daily. 60 tablet 0  . hydrOXYzine (ATARAX/VISTARIL) 25 MG tablet Take 1 tablet (25 mg total) by mouth every 6 (six) hours as needed for anxiety. 30 tablet 0  . lithium carbonate 300 MG capsule Take 1 capsule (300 mg total) by mouth 2 (two) times daily with a meal. 60 capsule 0  . OLANZapine zydis (ZYPREXA) 20 MG disintegrating tablet Take 1 tablet (20 mg total) by mouth at bedtime. 30 tablet 0  . oxyCODONE-acetaminophen (PERCOCET/ROXICET) 5-325 MG per tablet Take 1 tablet by mouth every 4 (four) hours as needed for severe pain.    . promethazine-codeine (PHENERGAN WITH CODEINE) 6.25-10 MG/5ML syrup Take 5 mLs by mouth  every 6 (six) hours as needed for cough.      Musculoskeletal: Strength & Muscle Tone: within normal limits Gait & Station: normal Patient leans: N/A  Psychiatric Specialty Exam: Review of Systems  Constitutional: Negative.   HENT: Negative.   Eyes: Negative.   Respiratory: Negative.   Cardiovascular: Negative.   Gastrointestinal: Negative.   Genitourinary: Negative.   Musculoskeletal: Negative.   Skin: Negative.   Neurological: Negative.   Endo/Heme/Allergies: Negative.   Psychiatric/Behavioral: Positive for depression, hallucinations and substance abuse. The patient is nervous/anxious.     Blood pressure 134/78, pulse 76, temperature 98.8 F (37.1 C), temperature source Oral, resp. rate 16, SpO2 100 %.There is no weight on file to calculate BMI.  General Appearance: Casual  Eye Contact::  Good  Speech:  Normal Rate  Volume:  Normal  Mood:  Euthymic  Affect:  Congruent  Thought Process:  Coherent  Orientation:  Full (Time, Place, and Person)  Thought Content:  WDL  Suicidal Thoughts:  No  Homicidal Thoughts:  No  Memory:  Immediate, good; recent, good; remote, good  Judgement:  Fair  Insight:  Fair  Psychomotor Activity:  Normal  Concentration:  Good  Recall:  Good  Fund of Knowledge: Good  Language: Good  Akathisia:  No  Handed:  Right  AIMS (if indicated):     Assets:  Housing Leisure Time Physical Health Resilience Social Support  ADL's:  Intact  Cognition: WNL  Sleep:      Treatment Plan Summary: Daily contact with patient to assess and evaluate symptoms and progress in treatment, Medication management and Plan drug induced psychosis:  -Crisis stabilization -Medication management:  Zyprexa 20 mg at bedtime for psychosis and Lithium 300 mg BID for mood stability started.   Vistaril 50 mg every 6 hours PRN anxiety continued -Individual and substance abuse counseling -Rx provided with a bus pass  Disposition: Discharge home with follow-up at  [facility], [place] 10/25/2015 10:19 AM   Patient seen face to face for psychiatric evaluation. Chart reviewed and finding discussed with Physician extender. Agreed with disposition and treatment plan.  Corena Pilgrim, MD

## 2015-10-25 NOTE — Discharge Instructions (Signed)
For your ongoing mental health needs, you are advised to follow up with [facility].  If you do not currently have an appointment, new and returning patients are seen at their walk-in clinic.  Walk-in hours are Monday - Friday from 8:00 am - 3:00 pm.  Walk-in patients are seen on a first come, first served basis.  Try to arrive as early as possible for he best chance of being seen the same day: ° °     [facility] °     [address] °     [place], [place] [address] °     [phone] °

## 2015-10-25 NOTE — ED Notes (Signed)
Pt. Noted in hall. No complaints or concerns voiced. No distress or abnormal behavior noted. Will continue to monitor with security cameras. Q 15 minute rounds continue. 

## 2015-10-25 NOTE — BH Assessment (Signed)
[facility] Assessment Progress Note  Per Thedore MinsMojeed Akintayo, MD, this pt does not require psychiatric hospitalization at this time.  Pt is under IVC initiated by his mother.  Petition is to be rescinded and [facility]  Petition has been rescinded.  [facility] information has been included in discharge instructions.  Pt's nurse has been notified.  Doylene Canninghomas Arlita Buffkin, MA Triage Specialist [phone]

## 2015-10-25 NOTE — ED Notes (Signed)
Belongings returned to pt

## 2015-10-30 ENCOUNTER — Emergency Department (HOSPITAL_COMMUNITY)
Admission: EM | Admit: 2015-10-30 | Discharge: 2015-10-30 | Disposition: A | Discharge status: Home / Self Care | Payer: Federal, State, Local not specified - Other | Attending: Emergency Medicine | Admitting: Emergency Medicine

## 2015-10-30 ENCOUNTER — Encounter (HOSPITAL_COMMUNITY): Payer: Self-pay

## 2015-10-30 DIAGNOSIS — F1220 Cannabis dependence, uncomplicated: Secondary | ICD-10-CM

## 2015-10-30 DIAGNOSIS — F3110 Bipolar disorder, current episode manic without psychotic features, unspecified: Secondary | ICD-10-CM

## 2015-10-30 DIAGNOSIS — F909 Attention-deficit hyperactivity disorder, unspecified type: Secondary | ICD-10-CM | POA: Insufficient documentation

## 2015-10-30 DIAGNOSIS — F1210 Cannabis abuse, uncomplicated: Secondary | ICD-10-CM | POA: Insufficient documentation

## 2015-10-30 DIAGNOSIS — F209 Schizophrenia, unspecified: Secondary | ICD-10-CM | POA: Insufficient documentation

## 2015-10-30 DIAGNOSIS — G8929 Other chronic pain: Secondary | ICD-10-CM | POA: Insufficient documentation

## 2015-10-30 DIAGNOSIS — R1032 Left lower quadrant pain: Secondary | ICD-10-CM | POA: Insufficient documentation

## 2015-10-30 DIAGNOSIS — Z79899 Other long term (current) drug therapy: Secondary | ICD-10-CM | POA: Insufficient documentation

## 2015-10-30 DIAGNOSIS — F17200 Nicotine dependence, unspecified, uncomplicated: Secondary | ICD-10-CM | POA: Insufficient documentation

## 2015-10-30 LAB — COMPREHENSIVE METABOLIC PANEL
ALT: 24 U/L (ref 17–63)
AST: 27 U/L (ref 15–41)
Albumin: 4.2 g/dL (ref 3.5–5.0)
Alkaline Phosphatase: 83 U/L (ref 38–126)
Anion gap: 6 (ref 5–15)
BILIRUBIN TOTAL: 0.4 mg/dL (ref 0.3–1.2)
BUN: 12 mg/dL (ref 6–20)
CALCIUM: 9.2 mg/dL (ref 8.9–10.3)
CHLORIDE: 104 mmol/L (ref 101–111)
CO2: 28 mmol/L (ref 22–32)
CREATININE: 0.74 mg/dL (ref 0.61–1.24)
Glucose, Bld: 104 mg/dL — ABNORMAL HIGH (ref 65–99)
Potassium: 4.2 mmol/L (ref 3.5–5.1)
Sodium: 138 mmol/L (ref 135–145)
TOTAL PROTEIN: 7.4 g/dL (ref 6.5–8.1)

## 2015-10-30 LAB — CBC WITH DIFFERENTIAL/PLATELET
BASOS ABS: 0 10*3/uL (ref 0.0–0.1)
BASOS PCT: 1 %
EOS ABS: 0.2 10*3/uL (ref 0.0–0.7)
EOS PCT: 4 %
HCT: 41.4 % (ref 39.0–52.0)
Hemoglobin: 13.3 g/dL (ref 13.0–17.0)
LYMPHS PCT: 32 %
Lymphs Abs: 1.5 10*3/uL (ref 0.7–4.0)
MCH: 29 pg (ref 26.0–34.0)
MCHC: 32.1 g/dL (ref 30.0–36.0)
MCV: 90.4 fL (ref 78.0–100.0)
MONO ABS: 0.2 10*3/uL (ref 0.1–1.0)
Monocytes Relative: 5 %
Neutro Abs: 2.6 10*3/uL (ref 1.7–7.7)
Neutrophils Relative %: 58 %
PLATELETS: 295 10*3/uL (ref 150–400)
RBC: 4.58 MIL/uL (ref 4.22–5.81)
RDW: 12.6 % (ref 11.5–15.5)
WBC: 4.5 10*3/uL (ref 4.0–10.5)

## 2015-10-30 LAB — ETHANOL

## 2015-10-30 LAB — RAPID URINE DRUG SCREEN, HOSP PERFORMED
AMPHETAMINES: NOT DETECTED
BENZODIAZEPINES: NOT DETECTED
Barbiturates: NOT DETECTED
COCAINE: NOT DETECTED
OPIATES: NOT DETECTED
Tetrahydrocannabinol: POSITIVE — AB

## 2015-10-30 NOTE — ED Notes (Signed)
Pt is oriented and states for safety to travel home.  Bus pass given.

## 2015-10-30 NOTE — BH Assessment (Addendum)
Assessment Note  Philip [name] is an 24 y.o. male who presents to [facility] voluntarily stating "it's a new year and i want to start off freRaymondo [name] and leave all that BS in the past." Patient states that he came in "for help to get my own apartment and to get my SSI check so I can take care of myself and don't gotta worry about nobody else stealing from me." Patient states that he has not slept in three days and "the only time I sleep good is when I'm here." Patient states that his appetite is good. Patient endorses symptoms of depression as; "can't sleep, busting out crying, hearing things" and inability to concentrate. Patient states that his recent stressors is that his friends have been dying and he feels that his family is not supportive. Patient states that he hears voices that tell him to "beat people up, my on and off switch messed up." Patient denies voices telling him to kill himself or anyone else.  Patient denies hearing voices at this time and states that it is more when he is upset. Patient denies SI and HI. Patient denies previous suicide attempts. Patient denies history of self-injurious behavior. Patient denies history of violence towards others. Patient denies pending charges, upcoming court dates, and probation. Patient denies access to weapons or firearms. Patient states that he smokes .5 ounce of THC daily and drinks a fifth of Hennessy daily. [facility]. Patient denies use of other substances.  Patient denies SI/HI and AVH at this time and does not appear to be responding to internal stimuli. Patient UDS +THC and BAL <5 at time of assessment.    Patient is calm and cooperative and alert and oriented x4. Patient states that he has not slept in three days but he sleeps better when he is in the [facility]. Patient states that his appetite is good. Patient states that he does not want treatment and would like to to stay in the [facility]. Patient states that he has a  [facility] "but I won't make it because I'll be back there(points to [facility]." Patient states that he has not taken his medication and is prescribed percocet, Adderall, "and other stuff for pain." That he would like here in the ED.   Consulted with Philip ByesJosephine Onuoha, NP who saw patient face-to-face and recommends patient be discharged [facility] as scheduled.   Diagnosis: Cannabis use disorder, Severe  Past Medical History:  Past Medical History  Diagnosis Date  . Bipolar affective disorder (HCC)   . Schizophrenia (HCC)   . ADHD (attention deficit hyperactivity disorder)     History reviewed. No pertinent past surgical history.  Family History:  Family History  Problem Relation Age of Onset  . Hypertension Mother     Social History:  reports that he has been smoking.  He does not have any smokeless tobacco history on file. He reports that he drinks about 0.6 oz of alcohol per week. He reports that he uses illicit drugs (Marijuana).  Additional Social History:  Alcohol / Drug Use Pain Medications: See PTA Prescriptions: See PTA Over the Counter: See PTA History of alcohol / drug use?: Yes Longest period of sobriety (when/how long): 9 months Negative Consequences of Use: Personal relationships, Legal Substance #1 Name of Substance 1: THC 1 - Age of First Use: 13 1 - Amount (size/oz): .5 ounce 1 -  Frequency: daily 1 - Duration: ongoing 1 - Last Use / Amount: this morning, 1.5 grams Substance #2 Name of Substance 2: Alcohol 2 - Age of First Use: 14 2 - Amount (size/oz): 1/5th liquor 2 - Frequency: daily 2 - Duration: ongoing 2 - Last Use / Amount: this morning 2 shots  CIWA: CIWA-Ar BP: 139/93 mmHg Pulse Rate: 88 COWS:    Allergies:  Allergies  Allergen Reactions  . Shrimp [Shellfish Allergy] Anaphylaxis    Home Medications:  (Not in a hospital admission)  OB/GYN Status:  No LMP for male  patient.  General Assessment Data [facility] of [facility]: [facility] Assessment: In system Is this a Tele or Face-to-Face Assessment?: Face-to-Face Is this an Initial Assessment or a Re-assessment for this encounter?: Initial Assessment Marital status: Single Maiden name: N/A Is patient pregnant?: No Pregnancy Status: No Living Arrangements: Parent Can pt return to current living arrangement?: Yes Admission Status: Voluntary Is patient capable of signing voluntary admission?: Yes Referral Source: Self/Family/Friend     Crisis Care Plan Living Arrangements: Parent Name of Psychiatrist: Vesta [name] Name of Therapist: [facility]  Education Status Is patient currently in school?: No Highest grade of school patient has completed: GED  Risk to self with the past 6 months Suicidal Ideation: No Has patient been a risk to self within the past 6 months prior to admission? : No Suicidal Intent: No Has patient had any suicidal intent within the past 6 months prior to admission? : No Is patient at risk for suicide?: No Suicidal Plan?: No Has patient had any suicidal plan within the past 6 months prior to admission? : No Access to Means: No What has been your use of drugs/alcohol within the last 12 months?: THC and Alcohol daily Previous Attempts/Gestures: No How many times?: 0 Other Self Harm Risks: Denies Triggers for Past Attempts: None known Intentional Self Injurious Behavior: None Family Suicide History: No Recent stressful life event(s): Loss (Comment) ("life" friends dying, bonding people out of jail) Persecutory voices/beliefs?: No Depression: Yes ("sad all the time" "bursting out crying, can't sleep") Depression Symptoms: Insomnia Substance abuse history and/or treatment for substance abuse?: Yes Suicide prevention information given to non-admitted patients: Not applicable  Risk to Others within the past 6 months Homicidal Ideation: No Does patient have any lifetime risk of  violence toward others beyond the six months prior to admission? : No Thoughts of Harm to Others: No Current Homicidal Intent: No Current Homicidal Plan: No Access to Homicidal Means: No Identified Victim: Denies History of harm to others?: No Assessment of Violence: None Noted Violent Behavior Description: Denies Does patient have access to weapons?: No Criminal Charges Pending?: No Does patient have a court date: No Is patient on probation?: No  Psychosis Hallucinations: Auditory, Visual ("I see my homeboys faces, I be hearing them" ) Delusions: None noted  Mental Status Report Appearance/Hygiene: Unremarkable Eye Contact: Fair Motor Activity: Freedom of movement Speech: Logical/coherent Level of Consciousness: Alert Mood: Pleasant Affect: Appropriate to circumstance Anxiety Level: None Thought Processes: Coherent, Relevant Judgement: Unimpaired Orientation: Person, Place, Time, Situation, Appropriate for developmental age Obsessive Compulsive Thoughts/Behaviors: None  Cognitive Functioning Concentration: Decreased Memory: Recent Intact, Remote Intact IQ: Average Insight: Fair Impulse Control: Fair Appetite: Good Sleep: Decreased Total Hours of Sleep:  (no sleep in three days) Vegetative Symptoms: None  ADLScreening [facility]) Patient's cognitive ability adequate to safely complete daily activities?: Yes Patient able to express need for assistance with ADLs?: Yes Independently performs ADLs?: Yes (appropriate for  developmental age)  Prior Inpatient Therapy Prior Inpatient Therapy: Yes Prior Therapy Dates: 7 months ago Prior Therapy Facilty/Provider(s): [facility] Reason for Treatment: Schizoaffective Disorder  Prior Outpatient Therapy Prior Outpatient Therapy: Yes Prior Therapy Dates: 2014-Present Prior Therapy Facilty/Provider(s): [name] Reason for Treatment: Schizoaffective Disorder Does patient have an ACCT team?: No Does patient have  Intensive In-House Services?  : No Does patient have Philip [name] services? : Yes Does patient have P4CC services?: No  ADL Screening (condition at time of admission) Patient's cognitive ability adequate to safely complete daily activities?: Yes Is the patient deaf or have difficulty hearing?: No Does the patient have difficulty seeing, even when wearing glasses/contacts?: No Does the patient have difficulty concentrating, remembering, or making decisions?: No Patient able to express need for assistance with ADLs?: Yes Does the patient have difficulty dressing or bathing?: No Independently performs ADLs?: Yes (appropriate for developmental age) Does the patient have difficulty walking or climbing stairs?: No Weakness of Legs: None Weakness of Arms/Hands: None  Home Assistive Devices/Equipment Home Assistive Devices/Equipment: None  Therapy Consults (therapy consults require a physician order) PT Evaluation Needed: No OT Evalulation Needed: No SLP Evaluation Needed: No Abuse/Neglect Assessment (Assessment to be complete while patient is alone) Physical Abuse: Denies Verbal Abuse: Denies Sexual Abuse: Denies Exploitation of patient/patient's resources: Denies Self-Neglect: Denies Values / Beliefs Cultural Requests During Hospitalization: None Spiritual Requests During Hospitalization: None Consults Spiritual Care Consult Needed: No Social Work Consult Needed: No Merchant navy officer (For Healthcare) Does patient have an advance directive?: No Would patient like information on creating an advanced directive?: No - patient declined information    Additional Information 1:1 In Past 12 Months?: No CIRT Risk: No Elopement Risk: No Does patient have medical clearance?: Yes     Disposition:  Disposition Initial Assessment Completed for this Encounter: Yes  On Site Evaluation by:   Reviewed with Physician:    Danyell Shader 10/30/2015 10:59 AM

## 2015-10-30 NOTE — ED Notes (Signed)
[facility]

## 2015-10-30 NOTE — Consult Note (Signed)
[facility] Consult   Reason for Consult:  [place] Referring Physician:  EDP Patient Identification: Philip [name] MRN:  [id number] Principal Diagnosis: Cannabis use disorder, severe, dependence ([place]) Diagnosis:   Patient Active Problem List   Diagnosis Date Noted  . Schizoaffective disorder, bipolar type ([place]) [F25.0]     Priority: High  . Cannabis use disorder, severe, dependence ([place]) [F12.20]     Priority: Medium  . Agitation [R45.1] 09/16/2014  . Polysubstance abuse [F19.10] 09/16/2014  . Substance induced mood disorder ([place]) [F19.94] 09/16/2014  . Hyperammonemia ([place]) [E72.20] 09/07/2014  . Psychotic disorder [F29] 09/01/2014    Total Time spent with patient: 45 minutes  Subjective:   Philip [name] is a 24 y.o. male patient admitted with anxiety  HPI:  AA male, 24 years old was evaluated for anxiety.  Patient was discharged from the ER last week for same presentation as today.  Patient came in to the ER today with complaint of feeling anxious.  Patient states that he decided to come to the ER hoping to be transferred to [facility] for him to get some sleep.  Patient reports that usually when he is stressed out he comes to the ER for some good sleep.  Today patient reported he came in for some rest and sleep and stated that tomorrow being [place] will be "rough" on the street.  Patient reports that he has been using unknown amount of Marijuana yesterday and Alcohol but his Alcohol level is < 5.  He reports that he want to put 2016 behind him and live a better life next year.  He plans to go back to school next year but will need his Adderall to help him focus.  He had informed TTS staff this morning that he came to the ER to speak with the SW for assistance with housing, job and transportation.  Patient denies SI/HI/AVH.  Patient is alert and oriented x 4.  Patient is cleared by Psychiatry.  Past Psychiatric History:  Schizoaffective disorder, Cannabis use disorder,  substance induced mood disorder  Risk to Self: Suicidal Ideation: No Suicidal Intent: No Is patient at risk for suicide?: No Suicidal Plan?: No Access to Means: No What has been your use of drugs/alcohol within the last 12 months?: THC and Alcohol daily How many times?: 0 Other Self Harm Risks: Denies Triggers for Past Attempts: None known Intentional Self Injurious Behavior: None Risk to Others: Homicidal Ideation: No Thoughts of Harm to Others: No Current Homicidal Intent: No Current Homicidal Plan: No Access to Homicidal Means: No Identified Victim: Denies History of harm to others?: No Assessment of Violence: None Noted Violent Behavior Description: Denies Does patient have access to weapons?: No Criminal Charges Pending?: No Does patient have a court date: No Prior Inpatient Therapy: Prior Inpatient Therapy: Yes Prior Therapy Dates: 7 months ago Prior Therapy Facilty/Provider(s): [facility] Reason for Treatment: Schizoaffective Disorder Prior Outpatient Therapy: Prior Outpatient Therapy: Yes Prior Therapy Dates: 2014-Present Prior Therapy Facilty/Provider(s): [facility] Reason for Treatment: Schizoaffective Disorder Does patient have an ACCT team?: No Does patient have Intensive In-House Services?  : No Does patient have [facility] services? : Yes Does patient have P4CC services?: No  Past Medical History:  Past Medical History  Diagnosis Date  . Bipolar affective disorder ([place])   . Schizophrenia ([place])   . ADHD (attention deficit hyperactivity disorder)    History reviewed. No pertinent past surgical history. Family History:  Family History  Problem Relation Age of Onset  . Hypertension  Mother    Family Psychiatric  History: Unknown by patient Social History:  History  Alcohol Use  . 0.6 oz/week  . 1 Cans of beer per week     History  Drug Use  . Yes  . Special: Marijuana    Social History   Social History  . Marital Status: Married    Spouse Name: N/A  .  Number of Children: N/A  . Years of Education: N/A   Social History Main Topics  . Smoking status: Current Every Day Smoker -- 1.00 packs/day for 6 years  . Smokeless tobacco: None  . Alcohol Use: 0.6 oz/week    1 Cans of beer per week  . Drug Use: Yes    Special: Marijuana  . Sexual Activity: Not Asked   Other Topics Concern  . None   Social History Narrative   Additional Social History:    Pain Medications: See PTA Prescriptions: See PTA Over the Counter: See PTA History of alcohol / drug use?: Yes Longest period of sobriety (when/how long): 9 months Negative Consequences of Use: Personal relationships, Legal Name of Substance 1: THC 1 - Age of First Use: 13 1 - Amount (size/oz): .5 ounce 1 - Frequency: daily 1 - Duration: ongoing 1 - Last Use / Amount: this morning, 1.5 grams Name of Substance 2: Alcohol 2 - Age of First Use: 14 2 - Amount (size/oz): 1/5th liquor 2 - Frequency: daily 2 - Duration: ongoing 2 - Last Use / Amount: this morning 2 shots                 Allergies:   Allergies  Allergen Reactions  . Shrimp [Shellfish Allergy] Anaphylaxis    Labs:  Results for orders placed or performed during the hospital encounter of 10/30/15 (from the past 48 hour(s))  Comprehensive metabolic panel     Status: Abnormal   Collection Time: 10/30/15  9:59 AM  Result Value Ref Range   Sodium 138 135 - 145 mmol/L   Potassium 4.2 3.5 - 5.1 mmol/L   Chloride 104 101 - 111 mmol/L   CO2 28 22 - 32 mmol/L   Glucose, Bld 104 (H) 65 - 99 mg/dL   BUN 12 6 - 20 mg/dL   Creatinine, Ser 0.74 0.61 - 1.24 mg/dL   Calcium 9.2 8.9 - 10.3 mg/dL   Total Protein 7.4 6.5 - 8.1 g/dL   Albumin 4.2 3.5 - 5.0 g/dL   AST 27 15 - 41 U/L   ALT 24 17 - 63 U/L   Alkaline Phosphatase 83 38 - 126 U/L   Total Bilirubin 0.4 0.3 - 1.2 mg/dL   GFR calc non Af Amer >60 >60 mL/min   GFR calc Af Amer >60 >60 mL/min    Comment: (NOTE) The eGFR has been calculated using the CKD EPI  equation. This calculation has not been validated in all clinical situations. eGFR's persistently <60 mL/min signify possible Chronic Kidney Disease.    Anion gap 6 5 - 15  Ethanol     Status: None   Collection Time: 10/30/15  9:59 AM  Result Value Ref Range   Alcohol, Ethyl (B) <5 <5 mg/dL    Comment:        LOWEST DETECTABLE LIMIT FOR SERUM ALCOHOL IS 5 mg/dL FOR MEDICAL PURPOSES ONLY   CBC with Diff     Status: None   Collection Time: 10/30/15  9:59 AM  Result Value Ref Range   WBC 4.5 4.0 - 10.5  K/uL   RBC 4.58 4.22 - 5.81 MIL/uL   Hemoglobin 13.3 13.0 - 17.0 g/dL   HCT 41.4 39.0 - 52.0 %   MCV 90.4 78.0 - 100.0 fL   MCH 29.0 26.0 - 34.0 pg   MCHC 32.1 30.0 - 36.0 g/dL   RDW 12.6 11.5 - 15.5 %   Platelets 295 150 - 400 K/uL   Neutrophils Relative % 58 %   Neutro Abs 2.6 1.7 - 7.7 K/uL   Lymphocytes Relative 32 %   Lymphs Abs 1.5 0.7 - 4.0 K/uL   Monocytes Relative 5 %   Monocytes Absolute 0.2 0.1 - 1.0 K/uL   Eosinophils Relative 4 %   Eosinophils Absolute 0.2 0.0 - 0.7 K/uL   Basophils Relative 1 %   Basophils Absolute 0.0 0.0 - 0.1 K/uL  Urine rapid drug screen (hosp performed)not at [facility]     Status: Abnormal   Collection Time: 10/30/15 11:00 AM  Result Value Ref Range   Opiates NONE DETECTED NONE DETECTED   Cocaine NONE DETECTED NONE DETECTED   Benzodiazepines NONE DETECTED NONE DETECTED   Amphetamines NONE DETECTED NONE DETECTED   Tetrahydrocannabinol POSITIVE (A) NONE DETECTED   Barbiturates NONE DETECTED NONE DETECTED    Comment:        DRUG SCREEN FOR MEDICAL PURPOSES ONLY.  IF CONFIRMATION IS NEEDED FOR ANY PURPOSE, NOTIFY LAB WITHIN 5 DAYS.        LOWEST DETECTABLE LIMITS FOR URINE DRUG SCREEN Drug Class       Cutoff (ng/mL) Amphetamine      1000 Barbiturate      200 Benzodiazepine   956 Tricyclics       213 Opiates          300 Cocaine          300 THC              50     No current facility-administered medications for this encounter.    Current Outpatient Prescriptions  Medication Sig Dispense Refill  . albuterol (PROVENTIL HFA;VENTOLIN HFA) 108 (90 BASE) MCG/ACT inhaler Inhale 2 puffs into the lungs every 6 (six) hours as needed for wheezing or shortness of breath.    . ALPRAZolam (XANAX) 0.5 MG tablet Take 0.5 mg by mouth at bedtime as needed for anxiety.    Marland Kitchen amitriptyline (ELAVIL) 25 MG tablet Take 1 tablet (25 mg total) by mouth at bedtime. 30 tablet 0  . amphetamine-dextroamphetamine (ADDERALL) 20 MG tablet Take 1 tablet (20 mg total) by mouth daily. 14 tablet 0  . benztropine (COGENTIN) 1 MG tablet Take 1 tablet (1 mg total) by mouth 2 (two) times daily. 60 tablet 0  . hydrOXYzine (ATARAX/VISTARIL) 25 MG tablet Take 1 tablet (25 mg total) by mouth every 6 (six) hours as needed for anxiety. 30 tablet 0  . lithium carbonate 300 MG capsule Take 1 capsule (300 mg total) by mouth 2 (two) times daily with a meal. 60 capsule 0  . OLANZapine zydis (ZYPREXA) 20 MG disintegrating tablet Take 1 tablet (20 mg total) by mouth at bedtime. 30 tablet 0  . oxyCODONE-acetaminophen (PERCOCET/ROXICET) 5-325 MG per tablet Take 1 tablet by mouth every 4 (four) hours as needed for severe pain.    . promethazine-codeine (PHENERGAN WITH CODEINE) 6.25-10 MG/5ML syrup Take 5 mLs by mouth every 6 (six) hours as needed for cough.      Musculoskeletal: Strength & Muscle Tone: within normal limits Gait & Station: normal Patient leans: N/A  Psychiatric Specialty Exam: Review of Systems  Constitutional: Negative.   HENT: Negative.   Eyes: Negative.   Respiratory: Negative.   Cardiovascular: Negative.   Gastrointestinal: Negative.   Genitourinary: Negative.   Musculoskeletal: Negative.   Skin: Negative.   Neurological: Negative.   Endo/Heme/Allergies: Negative.     Blood pressure 145/89, pulse 77, temperature 97.4 F (36.3 C), temperature source Oral, resp. rate 18, SpO2 100 %.There is no weight on file to calculate BMI.  General  Appearance: Casual and Fairly Groomed  Engineer, water::  Good  Speech:  Clear and Coherent and Normal Rate  Volume:  Normal  Mood:  Euthymic  Affect:  Congruent  Thought Process:  Coherent, Goal Directed and Intact  Orientation:  Full (Time, Place, and Person)  Thought Content:  WDL  Suicidal Thoughts:  No  Homicidal Thoughts:  No  Memory:  Immediate;   Good Recent;   Good Remote;   Good  Judgement:  Good  Insight:  Good  Psychomotor Activity:  Normal  Concentration:  Good  Recall:  NA  Fund of Knowledge:Good  Language: Good  Akathisia:  NA  Handed:  Right  AIMS (if indicated):     Assets:  Desire for Improvement  ADL's:  Intact  Cognition: WNL  Sleep:      Treatment Plan Summary:   Disposition:  Cleared by Psychiatry.  Patient to follow up with [facility] for his medications and counseling.  Delfin Gant   PMHNP-BC 10/30/2015 1:57 PM  Agree with NP Assessment and Plan as above

## 2015-10-30 NOTE — [facility] Suicide Risk Assessment (Cosign Needed)
Suicide Risk Assessment  Discharge Assessment   [facility]   Demographic Factors:  Male, Adolescent or young adult, Low socioeconomic status and Unemployed  Total Time spent with patient: 20 minutes  Musculoskeletal: Strength & Muscle Tone: within normal limits Gait & Station: normal Patient leans: N/A  Psychiatric Specialty Exam:     Blood pressure 145/89, pulse 77, temperature 97.4 F (36.3 C), temperature source Oral, resp. rate 18, SpO2 100 %.There is no weight on file to calculate BMI.  General Appearance: Casual and Fairly Groomed  Patent attorneyye Contact:: Good  Speech: Clear and Coherent and Normal Rate  Volume: Normal  Mood: Euthymic  Affect: Congruent  Thought Process: Coherent, Goal Directed and Intact  Orientation: Full (Time, Place, and Person)  Thought Content: WDL  Suicidal Thoughts: No  Homicidal Thoughts: No  Memory: Immediate; Good Recent; Good Remote; Good  Judgement: Good  Insight: Good  Psychomotor Activity: Normal  Concentration: Good  Recall: NA  Fund of Knowledge:Good  Language: Good  Akathisia: NA  Handed: Right  AIMS (if indicated):    Assets: Desire for Improvement  ADL's: Intact  Cognition: WNL            Has this patient used any form of tobacco in the last 30 days? (Cigarettes, Smokeless Tobacco, Cigars, and/or Pipes) N/A  Mental Status Per Nursing Assessment::   On Admission:     Current Mental Status by Physician: NA  Loss Factors: NA  Historical Factors: NA  Risk Reduction Factors:   Living with another person, especially a relative  Continued Clinical Symptoms:  Bipolar Disorder:   Depressive phase Alcohol/Substance Abuse/Dependencies Previous Psychiatric Diagnoses and Treatments  Cognitive Features That Contribute To Risk:  Polarized thinking    Suicide Risk:  Minimal: No identifiable suicidal ideation.  Patients presenting with no risk  factors but with morbid ruminations; may be classified as minimal risk based on the severity of the depressive symptoms  Principal Problem: Cannabis use disorder, severe, dependence [facility](HCC) Discharge Diagnoses:  Patient Active Problem List   Diagnosis Date Noted  . Schizoaffective disorder, bipolar type (HCC) [F25.0]     Priority: High  . Cannabis use disorder, severe, dependence (HCC) [F12.20]     Priority: Medium  . Bipolar I disorder with mania (HCC) [F31.10]   . Agitation [R45.1] 09/16/2014  . Polysubstance abuse [F19.10] 09/16/2014  . Substance induced mood disorder (HCC) [F19.94] 09/16/2014  . Hyperammonemia (HCC) [E72.20] 09/07/2014  . Psychotic disorder [F29] 09/01/2014    Follow-up Information    [facility] an appointment as [facility] as [facility]   [facility]  [facility]   Contact information:   [address] [place] [address] [phone]       Plan Of Care/Follow-up recommendations:  Activity:  as tolerated Diet:  regular  Is patient on multiple antipsychotic therapies at discharge:  No   Has Patient had three or more failed trials of antipsychotic monotherapy by history:  No  Recommended Plan for Multiple Antipsychotic Therapies: NA    Earney NavyJosephine C Keinan Brouillet   PMHNP-BC 10/30/2015, 6:26 PM

## 2015-10-30 NOTE — ED Notes (Signed)
Pt is manic bipolar.  Pt has not been taking meds.  Pt has been angry, hallucinating, paranoid, not sleeping, crying.  Pt here with sister who also answers for patient.  Seen recently for same. Wants admit until he can get it together.

## 2015-10-30 NOTE — ED Notes (Signed)
TTS at bedside. 
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
## 2015-10-30 NOTE — ED Notes (Signed)
Family sitting with patient 

## 2015-11-04 ENCOUNTER — Emergency Department (HOSPITAL_COMMUNITY)
Admission: EM | Admit: 2015-11-04 | Discharge: 2015-11-05 | Disposition: A | Discharge status: Home / Self Care | Payer: Federal, State, Local not specified - Other | Attending: Emergency Medicine | Admitting: Emergency Medicine

## 2015-11-04 DIAGNOSIS — F309 Manic episode, unspecified: Secondary | ICD-10-CM | POA: Insufficient documentation

## 2015-11-04 DIAGNOSIS — F250 Schizoaffective disorder, bipolar type: Secondary | ICD-10-CM | POA: Insufficient documentation

## 2015-11-04 DIAGNOSIS — F17200 Nicotine dependence, unspecified, uncomplicated: Secondary | ICD-10-CM | POA: Insufficient documentation

## 2015-11-04 DIAGNOSIS — F909 Attention-deficit hyperactivity disorder, unspecified type: Secondary | ICD-10-CM | POA: Insufficient documentation

## 2015-11-04 DIAGNOSIS — F1210 Cannabis abuse, uncomplicated: Secondary | ICD-10-CM | POA: Insufficient documentation

## 2015-11-04 DIAGNOSIS — Z79899 Other long term (current) drug therapy: Secondary | ICD-10-CM | POA: Insufficient documentation

## 2015-11-04 DIAGNOSIS — F4325 Adjustment disorder with mixed disturbance of emotions and conduct: Secondary | ICD-10-CM | POA: Diagnosis present

## 2015-11-04 NOTE — ED Notes (Signed)
Pt has in belonging bag:  Northwest AirlinesBrown boots, grey hoodie sweater, Therapist, musicgrey jeans, red bandana, white long sleeve shirt, blue sweater, black t-shirt, green bracelet, brown bracelet, blue and orange bracelet, blue comb.

## 2015-11-05 ENCOUNTER — Encounter (HOSPITAL_COMMUNITY): Payer: Self-pay

## 2015-11-05 DIAGNOSIS — F4325 Adjustment disorder with mixed disturbance of emotions and conduct: Secondary | ICD-10-CM | POA: Diagnosis not present

## 2015-11-05 LAB — COMPREHENSIVE METABOLIC PANEL
ALT: 34 U/L (ref 17–63)
AST: 31 U/L (ref 15–41)
Albumin: 4 g/dL (ref 3.5–5.0)
Alkaline Phosphatase: 81 U/L (ref 38–126)
Anion gap: 5 (ref 5–15)
BUN: 8 mg/dL (ref 6–20)
CHLORIDE: 109 mmol/L (ref 101–111)
CO2: 30 mmol/L (ref 22–32)
Calcium: 9.1 mg/dL (ref 8.9–10.3)
Creatinine, Ser: 0.75 mg/dL (ref 0.61–1.24)
Glucose, Bld: 87 mg/dL (ref 65–99)
POTASSIUM: 4.1 mmol/L (ref 3.5–5.1)
Sodium: 144 mmol/L (ref 135–145)
Total Bilirubin: 0.8 mg/dL (ref 0.3–1.2)
Total Protein: 7.1 g/dL (ref 6.5–8.1)

## 2015-11-05 LAB — RAPID URINE DRUG SCREEN, HOSP PERFORMED
Amphetamines: NOT DETECTED
BARBITURATES: NOT DETECTED
BENZODIAZEPINES: NOT DETECTED
COCAINE: NOT DETECTED
Opiates: NOT DETECTED
TETRAHYDROCANNABINOL: POSITIVE — AB

## 2015-11-05 LAB — CBC
HEMATOCRIT: 39.3 % (ref 39.0–52.0)
HEMOGLOBIN: 12.6 g/dL — AB (ref 13.0–17.0)
MCH: 29.9 pg (ref 26.0–34.0)
MCHC: 32.1 g/dL (ref 30.0–36.0)
MCV: 93.3 fL (ref 78.0–100.0)
Platelets: 370 10*3/uL (ref 150–400)
RBC: 4.21 MIL/uL — ABNORMAL LOW (ref 4.22–5.81)
RDW: 12.9 % (ref 11.5–15.5)
WBC: 6.8 10*3/uL (ref 4.0–10.5)

## 2015-11-05 LAB — ETHANOL

## 2015-11-05 MED ORDER — ALUM & MAG HYDROXIDE-SIMETH 200-200-20 MG/5ML PO SUSP: 30.0000 mL | ORAL | Status: DC | PRN

## 2015-11-05 MED ORDER — HALOPERIDOL LACTATE 5 MG/ML IJ SOLN
2.0000 mg | Freq: Once | INTRAMUSCULAR | Status: AC
  Administered 2015-11-05: 2 mg via INTRAMUSCULAR
  Filled 2015-11-05: qty 1

## 2015-11-05 MED ORDER — ONDANSETRON HCL 4 MG PO TABS
4.0000 mg | ORAL_TABLET | Freq: Three times a day (TID) | ORAL | Status: DC | PRN
Start: 2015-11-05 — End: 2015-11-05

## 2015-11-05 MED ORDER — IBUPROFEN 200 MG PO TABS: 600.0000 mg | ORAL_TABLET | Freq: Three times a day (TID) | ORAL | Status: DC | PRN

## 2015-11-05 MED ORDER — LORAZEPAM 1 MG PO TABS: 1.0000 mg | ORAL_TABLET | Freq: Three times a day (TID) | ORAL | Status: DC | PRN
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
## 2015-11-05 NOTE — ED Notes (Signed)
Pt is alert and oriented to the unit.  He is in bed watching tv at this time.  RN provided drink per request.  No acute distress noted, no aggression noted.
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
## 2015-11-05 NOTE — [facility] Suicide Risk Assessment (Signed)
Suicide Risk Assessment  Discharge Assessment   [facility]   Demographic Factors:  Male and Adolescent or young adult  Total Time spent with patient: 45 minutes  Musculoskeletal: Strength & Muscle Tone: within normal limits Gait & Station: normal Patient leans: N/A  Psychiatric Specialty Exam: Review of Systems  Constitutional: Negative.   HENT: Negative.   Eyes: Negative.   Respiratory: Negative.   Cardiovascular: Negative.   Gastrointestinal: Negative.   Genitourinary: Negative.   Musculoskeletal: Negative.   Skin: Negative.   Neurological: Negative.   Endo/Heme/Allergies: Negative.   Psychiatric/Behavioral:       Negative    Blood pressure 128/80, pulse 69, temperature 98.5 F (36.9 C), temperature source Oral, resp. rate 20, SpO2 100 %.There is no weight on file to calculate BMI.  General Appearance: Casual  Eye Contact::  Good  Speech:  Normal Rate  Volume:  Normal  Mood:  Irritable  Affect:  Congruent  Thought Process:  Coherent  Orientation:  Full (Time, Place, and Person)  Thought Content:  WDL  Suicidal Thoughts:  No  Homicidal Thoughts:  No  Memory:  Immediate;   Good Recent;   Good Remote;   Good  Judgement:  Fair  Insight:  Fair  Psychomotor Activity:  Normal  Concentration:  Good  Recall:  Good  Fund of Knowledge:Good  Language: Good  Akathisia:  No  Handed:  Right  AIMS (if indicated):     Assets:  Housing Leisure Time Physical Health Resilience Social Support  ADL's:  Intact  Cognition: WNL  Sleep:         Has this patient used any form of tobacco in the last 30 days? (Cigarettes, Smokeless Tobacco, Cigars, and/or Pipes) Yes, A prescription for an FDA-approved tobacco cessation medication was offered at discharge and the patient refused  Mental Status Per Nursing Assessment::   On Admission:   Anger issues  Current Mental Status by Physician: NA  Loss Factors: NA  Historical Factors: NA  Risk  Reduction Factors:   Sense of responsibility to family, Living with another person, especially a relative, Positive social support and Positive therapeutic relationship  Continued Clinical Symptoms:  Irritability  Cognitive Features That Contribute To Risk:  None    Suicide Risk:  Minimal: No identifiable suicidal ideation.  Patients presenting with no risk factors but with morbid ruminations; may be classified as minimal risk based on the severity of the depressive symptoms  Principal Problem: Adjustment disorder with mixed disturbance of emotions and conduct Discharge Diagnoses:  Patient Active Problem List   Diagnosis Date Noted  . Adjustment disorder with mixed disturbance of emotions and conduct [F43.25] 11/05/2015    Priority: High  . Polysubstance abuse [F19.10] 09/16/2014    Priority: High  . Substance induced mood disorder (HCC) [F19.94] 09/16/2014    Priority: High  . Schizoaffective disorder, bipolar type (HCC) [F25.0]     Priority: High  . Bipolar I disorder with mania (HCC) [F31.10]   . Agitation [R45.1] 09/16/2014  . Cannabis use disorder, severe, dependence (HCC) [F12.20]   . Hyperammonemia (HCC) [E72.20] 09/07/2014  . Psychotic disorder [F29] 09/01/2014      Plan Of Care/Follow-up recommendations:  Activity:  as tolerated Diet:  heart healthy diet  Is patient on multiple antipsychotic therapies at discharge:  No   Has Patient had three or more failed trials of antipsychotic monotherapy by history:  No  Recommended Plan for Multiple Antipsychotic Therapies: NA    LORD, JAMISON, PMH-NP 11/05/2015,  3:30 PM

## 2015-11-05 NOTE — ED Notes (Signed)
Placed 2 pt belongings bags in locker #31.  Pt is watching tv and is in no acute distress.  Blinds and curtain are open to nurse's station.

## 2015-11-05 NOTE — ED Provider Notes (Signed)
CSN: [id number]     Arrival date & time 11/04/15  2343 History  By signing my name below, I, Elon Spanner, attest that this documentation has been prepared under the direction and in the presence of Dlisa Barnwell, MD. Electronically Signed: Elon Spanner, ED Scribe. 11/05/2015. 2:26 AM.    Chief Complaint  Patient presents with  . Manic Behavior   Patient is a 25 y.o. male presenting with mental health disorder. The history is provided by the patient. No language interpreter was used.  Mental Health Problem Presenting symptoms: no suicide attempt   Patient accompanied by:  Family member Degree of incapacity (severity):  Moderate Onset quality:  Gradual Timing:  Constant Progression:  Unchanged Chronicity:  Recurrent Context: noncompliance   Treatment compliance:  Some of the time Relieved by:  Nothing Worsened by:  Nothing tried Ineffective treatments:  None tried Associated symptoms: no abdominal pain   Risk factors: hx of mental illness   HPI Comments: Philip [name] is a 25 y.o. male who presents to the Emergency Department complaining of   Past Medical History  Diagnosis Date  . Bipolar affective disorder (HCC)   . Schizophrenia (HCC)   . ADHD (attention deficit hyperactivity disorder)    History reviewed. No pertinent past surgical history. Family History  Problem Relation Age of Onset  . Hypertension Mother    Social History  Substance Use Topics  . Smoking status: Current Every Day Smoker -- 1.00 packs/day for 6 years  . Smokeless tobacco: None  . Alcohol Use: 0.6 oz/week    1 Cans of beer per week    Review of Systems  Gastrointestinal: Negative for abdominal pain.  All other systems reviewed and are negative.  10 Systems reviewed and all are negative for acute change except as noted in the HPI.   Allergies  Shrimp  Home Medications   Prior to Admission medications   Medication Sig Start Date End Date Taking? Authorizing Provider  albuterol  (PROVENTIL HFA;VENTOLIN HFA) 108 (90 BASE) MCG/ACT inhaler Inhale 2 puffs into the lungs every 6 (six) hours as needed for wheezing or shortness of breath.   Yes Historical Provider, MD  ALPRAZolam Prudy Feeler) 0.5 MG tablet Take 0.5 mg by mouth at bedtime as needed for anxiety.   Yes Historical Provider, MD  amitriptyline (ELAVIL) 25 MG tablet Take 1 tablet (25 mg total) by mouth at bedtime. 01/20/15  Yes Earney Navy, NP  amphetamine-dextroamphetamine (ADDERALL) 20 MG tablet Take 1 tablet (20 mg total) by mouth daily. 01/20/15  Yes Earney Navy, NP  benztropine (COGENTIN) 1 MG tablet Take 1 tablet (1 mg total) by mouth 2 (two) times daily. 01/20/15  Yes Earney Navy, NP  hydrOXYzine (ATARAX/VISTARIL) 25 MG tablet Take 1 tablet (25 mg total) by mouth every 6 (six) hours as needed for anxiety. 09/10/14  Yes Adonis Brook, NP  lithium carbonate 300 MG capsule Take 1 capsule (300 mg total) by mouth 2 (two) times daily with a meal. 01/20/15  Yes Earney Navy, NP  OLANZapine zydis (ZYPREXA) 20 MG disintegrating tablet Take 1 tablet (20 mg total) by mouth at bedtime. 01/20/15  Yes Earney Navy, NP  oxyCODONE-acetaminophen (PERCOCET/ROXICET) 5-325 MG per tablet Take 1 tablet by mouth every 4 (four) hours as needed for severe pain.   Yes Historical Provider, MD  promethazine-codeine (PHENERGAN WITH CODEINE) 6.25-10 MG/5ML syrup Take 5 mLs by mouth every 6 (six) hours as needed for cough.   Yes Historical Provider, MD  BP 118/85 mmHg  Pulse 84  Temp(Src) 98.5 F (36.9 C) (Oral)  Resp 18  SpO2 100% Physical Exam  Constitutional: He is oriented to person, place, and time. He appears well-developed and well-nourished. No distress.  Resting comfortably  HENT:  Head: Normocephalic and atraumatic.  Mouth/Throat: Oropharynx is clear and moist.  Eyes: Conjunctivae and EOM are normal. Pupils are equal, round, and reactive to light.  Neck: Normal range of motion. Neck supple. No tracheal  deviation present.  Cardiovascular: Normal rate and intact distal pulses.   RRR  Pulmonary/Chest: Effort normal and breath sounds normal. No stridor. No respiratory distress. He has no wheezes. He has no rales.  Lungs clear  Abdominal: Soft. Bowel sounds are normal. There is no tenderness. There is no rebound and no guarding.  Musculoskeletal: Normal range of motion.  Neurological: He is alert and oriented to person, place, and time. He has normal reflexes.  Intact distal pulses.   Skin: Skin is warm and dry.  Psychiatric: He has a normal mood and affect. His behavior is normal.  Nursing note and vitals reviewed.   ED Course  Procedures (including critical care time)  DIAGNOSTIC STUDIES: Oxygen Saturation is 100% on RA, normal by my interpretation.    COORDINATION OF CARE:  2:25 AM Discussed treatment plan with patient at bedside.  Patient acknowledges and agrees with plan.    Labs Review Labs Reviewed  CBC - Abnormal; Notable for the following:    RBC 4.21 (*)    Hemoglobin 12.6 (*)    All other components within normal limits  URINE RAPID DRUG SCREEN, HOSP PERFORMED - Abnormal; Notable for the following:    Tetrahydrocannabinol POSITIVE (*)    All other components within normal limits  COMPREHENSIVE METABOLIC PANEL  ETHANOL    Imaging Review No results found. I have personally reviewed and evaluated these images and lab results as part of my medical decision-making.   EKG Interpretation None      MDM   Final diagnoses:  None    Dispo per TTS.    I personally performed the services described in this documentation, which was scribed in my presence. The recorded information has been reviewed and is accurate.      Cy BlamerApril Bryahna Lesko, MD 11/05/15 [phone]

## 2015-11-08 ENCOUNTER — Emergency Department (HOSPITAL_COMMUNITY)
Admission: EM | Admit: 2015-11-08 | Discharge: 2015-11-09 | Disposition: A | Discharge status: Other Acute Inpatient Hospital | Payer: Self-pay

## 2015-11-08 ENCOUNTER — Encounter (HOSPITAL_COMMUNITY): Payer: Self-pay | Admitting: Emergency Medicine

## 2015-11-08 DIAGNOSIS — F909 Attention-deficit hyperactivity disorder, unspecified type: Secondary | ICD-10-CM | POA: Insufficient documentation

## 2015-11-08 DIAGNOSIS — F17200 Nicotine dependence, unspecified, uncomplicated: Secondary | ICD-10-CM | POA: Insufficient documentation

## 2015-11-08 DIAGNOSIS — F39 Unspecified mood [affective] disorder: Secondary | ICD-10-CM | POA: Insufficient documentation

## 2015-11-08 DIAGNOSIS — R45850 Homicidal ideations: Secondary | ICD-10-CM | POA: Insufficient documentation

## 2015-11-08 DIAGNOSIS — F209 Schizophrenia, unspecified: Secondary | ICD-10-CM | POA: Insufficient documentation

## 2015-11-08 DIAGNOSIS — F1210 Cannabis abuse, uncomplicated: Secondary | ICD-10-CM | POA: Insufficient documentation

## 2015-11-08 DIAGNOSIS — Z79899 Other long term (current) drug therapy: Secondary | ICD-10-CM | POA: Insufficient documentation

## 2015-11-08 DIAGNOSIS — F250 Schizoaffective disorder, bipolar type: Secondary | ICD-10-CM | POA: Diagnosis present

## 2015-11-08 DIAGNOSIS — F319 Bipolar disorder, unspecified: Secondary | ICD-10-CM | POA: Insufficient documentation

## 2015-11-08 LAB — COMPREHENSIVE METABOLIC PANEL
ALBUMIN: 4.3 g/dL (ref 3.5–5.0)
ALK PHOS: 93 U/L (ref 38–126)
ALT: 25 U/L (ref 17–63)
ANION GAP: 10 (ref 5–15)
AST: 20 U/L (ref 15–41)
BUN: 9 mg/dL (ref 6–20)
CALCIUM: 9.7 mg/dL (ref 8.9–10.3)
CO2: 26 mmol/L (ref 22–32)
Chloride: 104 mmol/L (ref 101–111)
Creatinine, Ser: 0.74 mg/dL (ref 0.61–1.24)
GFR calc Af Amer: >60 mL/min (ref >60)
GFR calc non Af Amer: >60 mL/min (ref >60)
GLUCOSE: 126 mg/dL — AB (ref 65–99)
Potassium: 4 mmol/L (ref 3.5–5.1)
SODIUM: 140 mmol/L (ref 135–145)
Total Bilirubin: 0.6 mg/dL (ref 0.3–1.2)
Total Protein: 7.6 g/dL (ref 6.5–8.1)

## 2015-11-08 LAB — CBC WITH DIFFERENTIAL/PLATELET
BASOS ABS: 0 10*3/uL (ref 0.0–0.1)
BASOS PCT: 0 %
Eosinophils Absolute: 0.1 10*3/uL (ref 0.0–0.7)
Eosinophils Relative: 1 %
HEMATOCRIT: 42.6 % (ref 39.0–52.0)
HEMOGLOBIN: 13.8 g/dL (ref 13.0–17.0)
Lymphocytes Relative: 23 %
Lymphs Abs: 1.8 10*3/uL (ref 0.7–4.0)
MCH: 29.9 pg (ref 26.0–34.0)
MCHC: 32.4 g/dL (ref 30.0–36.0)
MCV: 92.2 fL (ref 78.0–100.0)
Monocytes Absolute: 0.3 10*3/uL (ref 0.1–1.0)
Monocytes Relative: 4 %
NEUTROS ABS: 5.7 10*3/uL (ref 1.7–7.7)
NEUTROS PCT: 72 %
Platelets: 397 10*3/uL (ref 150–400)
RBC: 4.62 MIL/uL (ref 4.22–5.81)
RDW: 12.8 % (ref 11.5–15.5)
WBC: 7.9 10*3/uL (ref 4.0–10.5)

## 2015-11-08 LAB — RAPID URINE DRUG SCREEN, HOSP PERFORMED
AMPHETAMINES: NOT DETECTED
Barbiturates: NOT DETECTED
Benzodiazepines: NOT DETECTED
COCAINE: NOT DETECTED
OPIATES: NOT DETECTED
Tetrahydrocannabinol: POSITIVE — AB

## 2015-11-08 LAB — LITHIUM LEVEL: Lithium Lvl: <0.06 mmol/L — ABNORMAL LOW (ref 0.60–1.20)

## 2015-11-08 LAB — SALICYLATE LEVEL: Salicylate Lvl: <4 mg/dL (ref 2.8–30.0)

## 2015-11-08 LAB — ACETAMINOPHEN LEVEL: Acetaminophen (Tylenol), Serum: <10 ug/mL — ABNORMAL LOW (ref 10–30)

## 2015-11-08 LAB — ETHANOL: Alcohol, Ethyl (B): <5 mg/dL (ref <5)

## 2015-11-08 MED ORDER — OLANZAPINE 10 MG PO TBDP
20.0000 mg | ORAL_TABLET | Freq: Every day | ORAL | Status: DC
  Administered 2015-11-08: 20 mg via ORAL
  Filled 2015-11-08: qty 2

## 2015-11-08 MED ORDER — LITHIUM CARBONATE 300 MG PO CAPS
300.0000 mg | ORAL_CAPSULE | Freq: Two times a day (BID) | ORAL | Status: DC
Start: 2015-11-09 — End: 2015-11-09
  Administered 2015-11-09: 300 mg via ORAL
  Filled 2015-11-08: qty 1

## 2015-11-08 MED ORDER — BENZTROPINE MESYLATE 1 MG PO TABS
1.0000 mg | ORAL_TABLET | Freq: Two times a day (BID) | ORAL | Status: DC
  Administered 2015-11-08 – 2015-11-09 (×2): 1 mg via ORAL
  Filled 2015-11-08 (×2): qty 1

## 2015-11-08 MED ORDER — HYDROXYZINE HCL 25 MG PO TABS
25.0000 mg | ORAL_TABLET | Freq: Four times a day (QID) | ORAL | Status: DC | PRN
  Administered 2015-11-08: 25 mg via ORAL
  Filled 2015-11-08: qty 1
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
## 2015-11-08 NOTE — ED Notes (Signed)
Bed: [facility]43 Expected date:  Expected time:  Means of arrival:  Comments: Triage 4 

## 2015-11-08 NOTE — ED Notes (Signed)
Pt Belonging bags x 2 taken to [facility].

## 2015-11-08 NOTE — ED Notes (Signed)
Pt AAO x 3, no distress noted, calm & cooperative,   Remains passive, HI, no specific plan.  Monitoring for safety, Q 15 min checks in effect.
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
## 2015-11-08 NOTE — ED Notes (Addendum)
Pt oriented to room and unit.  Pt denies SI and HI and AVH.  15 minute checks and video monitoring continue.

## 2015-11-08 NOTE — ED Notes (Addendum)
Pt has been changed into scrubs and wanded by Security.  
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
## 2015-11-09 ENCOUNTER — Observation Stay (HOSPITAL_COMMUNITY)
Admission: AD | Admit: 2015-11-09 | Discharge: 2015-11-10 | Disposition: A | Discharge status: Home / Self Care | Payer: Self-pay | Source: Intra-hospital | Attending: Psychiatry | Admitting: Psychiatry

## 2015-11-09 ENCOUNTER — Encounter (HOSPITAL_COMMUNITY): Payer: Self-pay

## 2015-11-09 DIAGNOSIS — F259 Schizoaffective disorder, unspecified: Secondary | ICD-10-CM | POA: Diagnosis present

## 2015-11-09 DIAGNOSIS — F4325 Adjustment disorder with mixed disturbance of emotions and conduct: Secondary | ICD-10-CM | POA: Insufficient documentation

## 2015-11-09 DIAGNOSIS — F17210 Nicotine dependence, cigarettes, uncomplicated: Secondary | ICD-10-CM | POA: Insufficient documentation

## 2015-11-09 DIAGNOSIS — F909 Attention-deficit hyperactivity disorder, unspecified type: Secondary | ICD-10-CM | POA: Insufficient documentation

## 2015-11-09 DIAGNOSIS — F1290 Cannabis use, unspecified, uncomplicated: Secondary | ICD-10-CM | POA: Insufficient documentation

## 2015-11-09 DIAGNOSIS — F250 Schizoaffective disorder, bipolar type: Principal | ICD-10-CM | POA: Insufficient documentation

## 2015-11-09 DIAGNOSIS — Z91013 Allergy to seafood: Secondary | ICD-10-CM | POA: Insufficient documentation

## 2015-11-09 DIAGNOSIS — Z590 Homelessness: Secondary | ICD-10-CM | POA: Insufficient documentation

## 2015-11-09 DIAGNOSIS — F39 Unspecified mood [affective] disorder: Secondary | ICD-10-CM | POA: Insufficient documentation

## 2015-11-09 DIAGNOSIS — J45909 Unspecified asthma, uncomplicated: Secondary | ICD-10-CM | POA: Insufficient documentation

## 2015-11-09 HISTORY — DX: Unspecified asthma, uncomplicated: J45.909

## 2015-11-09 MED ORDER — HYDROXYZINE HCL 25 MG PO TABS
50.0000 mg | ORAL_TABLET | Freq: Three times a day (TID) | ORAL | Status: DC
  Administered 2015-11-09 (×2): 50 mg via ORAL
  Filled 2015-11-09 (×2): qty 2

## 2015-11-09 MED ORDER — ACETAMINOPHEN 325 MG PO TABS: 650.0000 mg | ORAL_TABLET | Freq: Four times a day (QID) | ORAL | Status: DC | PRN

## 2015-11-09 MED ORDER — TRAZODONE HCL 100 MG PO TABS: 100.0000 mg | ORAL_TABLET | Freq: Every evening | ORAL | Status: DC | PRN

## 2015-11-09 MED ORDER — BENZTROPINE MESYLATE 0.5 MG PO TABS
0.5000 mg | ORAL_TABLET | Freq: Two times a day (BID) | ORAL | Status: DC
  Administered 2015-11-09 – 2015-11-10 (×2): 0.5 mg via ORAL
  Filled 2015-11-09 (×2): qty 1

## 2015-11-09 MED ORDER — OXCARBAZEPINE 150 MG PO TABS
600.0000 mg | ORAL_TABLET | Freq: Two times a day (BID) | ORAL | Status: DC
  Administered 2015-11-10: 600 mg via ORAL
  Filled 2015-11-09: qty 4

## 2015-11-09 MED ORDER — OLANZAPINE 5 MG PO TBDP
15.0000 mg | ORAL_TABLET | Freq: Two times a day (BID) | ORAL | Status: DC
  Administered 2015-11-10: 15 mg via ORAL
  Filled 2015-11-09: qty 1

## 2015-11-09 MED ORDER — BENZTROPINE MESYLATE 1 MG PO TABS: 0.5000 mg | ORAL_TABLET | Freq: Two times a day (BID) | ORAL | Status: DC

## 2015-11-09 MED ORDER — HYDROXYZINE HCL 50 MG PO TABS
50.0000 mg | ORAL_TABLET | Freq: Three times a day (TID) | ORAL | Status: DC
  Administered 2015-11-10 (×2): 50 mg via ORAL
  Filled 2015-11-09 (×2): qty 1

## 2015-11-09 MED ORDER — OXCARBAZEPINE 300 MG PO TABS
600.0000 mg | ORAL_TABLET | Freq: Two times a day (BID) | ORAL | Status: DC
  Administered 2015-11-09: 600 mg via ORAL
  Filled 2015-11-09 (×2): qty 2

## 2015-11-09 MED ORDER — OLANZAPINE 5 MG PO TBDP: 15.0000 mg | ORAL_TABLET | Freq: Two times a day (BID) | ORAL | Status: DC

## 2015-11-09 MED ORDER — MAGNESIUM HYDROXIDE 400 MG/5ML PO SUSP: 30.0000 mL | Freq: Every day | ORAL | Status: DC | PRN

## 2015-11-09 MED ORDER — ALUM & MAG HYDROXIDE-SIMETH 200-200-20 MG/5ML PO SUSP: 30.0000 mL | ORAL | Status: DC | PRN

## 2015-11-09 NOTE — ED Notes (Signed)
Pt discharged ambulatory with [place] driver.  All belongings were returned to pt. 
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
## 2015-11-09 NOTE — Progress Notes (Addendum)
CM spoke with pt who [place]cp.  CM discussed and provided written information for uninsured accepting pcps, discussed the importance of pcp vs EDP services for f/u care, www.needymeds.org, www.goodrx.com, discounted pharmacies and other [place] resources such as [facility] , [facility], affordable care act, financial assistance, uninsured dental services, [place] med assist, DSS and  health department  Reviewed resources for [place] uninsured accepting pcps like [facility], family medicine at [address], [facility], [facility], local urgent care centers, [facility], [facility], general medical clinics, [facility], [facility] urgent care plus others, medication resources, [facility] out patient pharmacies and housing Pt voiced understanding and appreciation of resources provided   Provided P4[facility] contact information Pt agreed to a referral Cm completed referral Pt to be contact by [facility] clinical liason   Entered in d/c instructions Please use the resources provided to you in emergency room by case manager to assist with doctor for follow up A referral for you has been sent to [facility] if you have not received a call in 3 days you may contact them Call Scherry RanKaren Andrianos at [phone] Tuesday-Friday [email or web address] These [place] uninsured resources provide possible primary care providers, resources for discounted medications, housing, dental resources, affordable care act information, plus other resources for [facility] [place] [facility] [place]

## 2015-11-10 DIAGNOSIS — F250 Schizoaffective disorder, bipolar type: Principal | ICD-10-CM

## 2015-11-10 MED ORDER — OXCARBAZEPINE 600 MG PO TABS: 600.0000 mg | ORAL_TABLET | Freq: Two times a day (BID) | ORAL | Status: DC

## 2015-11-10 MED ORDER — TRAZODONE HCL 100 MG PO TABS: 100.0000 mg | ORAL_TABLET | Freq: Every evening | ORAL | Status: DC | PRN

## 2015-11-10 MED ORDER — OLANZAPINE 15 MG PO TBDP: 15.0000 mg | ORAL_TABLET | Freq: Two times a day (BID) | ORAL | Status: DC

## 2015-11-10 MED ORDER — HYDROXYZINE HCL 25 MG PO TABS: 25.0000 mg | ORAL_TABLET | Freq: Four times a day (QID) | ORAL | Status: DC | PRN

## 2015-11-10 MED ORDER — BENZTROPINE MESYLATE 1 MG PO TABS: 1.0000 mg | ORAL_TABLET | Freq: Two times a day (BID) | ORAL | Status: DC
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
## 2015-11-10 NOTE — B[facility] Counselor (Signed)
Pt needs to be assessed by the A.M. extender to determine disposition. Pt currently denies HI/SI and was provided a bus pass with the intent that he can be discharged in the morning. Pt would benefit from following up with some OPT services if willing to participate. Pt disappointed because he does not meet inpatient criteria  Ardelle ParkLatoya McNeil, MA OBS Unit
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
## 2015-11-10 NOTE — Discharge Summary (Signed)
[facility] OBS UNIT DISCHARGE SUMMARY (Due to short LOS, pt evaluated once for H&P and D/C)  Patient Identification:  Philip [name] Date of Evaluation:  11/10/2015 Chief Complaint: Patient states, " I have mood swings ".  Subjective: Pt seen and chart reviewed. Pt is alert/oriented x4, calm, cooperative, and appropriate to situation. Pt denies suicidal/homicidal ideation and psychosis and does not appear to be responding to internal stimuli. Pt is known to this provider for over 10 years from [place] and also psychiatric settings. He is doing well today and appears to be at his known baseline. He reports that he is homeless and that it is his primary concern. He reports that he would like to have resources for outpatient psychiatric care and housing that are more stable; social work assisting with this.   Discharge comments: Pt is stable at this time for discharge with the plan below:  History of Present Illness: I have reviewed and concur with ED HPI elements, modified as below: AA male, 25 years old who is known to the service was evaluated today for agitation and anger issues. This is his 4th visit in a week. Patient came in stating he came to the hospital to be sent to [facility] to "sleep and chill out" Patient stated that the streets are too dangerous for him to live in. Patient stated that people are coming after him and are out to get him. Patient stated two days ago that he love to come to [facility] To eat and sleep. Patient calls staff members "my family" He has a hx of Schizoaffective disorder and is not taking medications. Patient does not see his Psychiatrist at [facility] and instead use illicit substances to treat himself. His UDS is positive for Marijuana. Patient was uncooperative and very angry towards providers during the assessment. He did not want to answer questions and stated that he needed some sleep. Patient is accepted for admission in our observation unit. We will start his  medications while we continue to evaluate him for inpatient hospitalization.  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  Pt spent the night in the [facility] OBS Unit without incident.  Total Time spent with patient: 50 minutes  Psychiatric Specialty Exam: Physical Exam  Review of Systems  Psychiatric/Behavioral: Positive for depression and substance abuse. Negative for suicidal ideas and hallucinations. The patient is nervous/anxious and has insomnia.   All other systems reviewed and are negative.   Blood pressure 122/73, pulse 76, temperature 98.3 F (36.8 C), temperature source Oral, resp. rate 18, height 6' (1.829 m), weight 68.947 kg (152 lb), SpO2 99 %.Body mass index is 20.61 kg/(m^2).  General Appearance: Casual and Fairly Groomed  Eye Contact::  Minimal  Speech:  Clear and Coherent and Normal Rate  Volume:  Normal  Mood:  Anxious and Dysphoric  Affect:  Labile  Thought Process:  Coherent and Goal Directed  Orientation:  Full (Time, Place, and Person)  Thought Content:  WDL  Suicidal Thoughts:  No  Homicidal Thoughts:  No  Memory:  Immediate;   Fair Recent;   grossly intact Remote;   grossly intact  Judgement:  Fair  Insight:  Fair  Psychomotor Activity:  Increased  Concentration:  Poor  Recall:  Poor  Fund of Knowledge:Fair  Language: Good  Akathisia:  No  Handed:  Right  AIMS (if indicated):     Assets:  Physical Health Social Support  Sleep:  Musculoskeletal: Strength & Muscle Tone: within normal limits Gait & Station: normal Patient leans: N/A  Past Psychiatric History: Diagnosis:schizoaffective versus bipolar do  [facility] ,pt also was seen by our consult team in the past.(pls see EMR)  Outpatient Care:[facility]  Substance Abuse Care:denies  Self-Mutilation:denies   Suicidal Attempts:yes ,tried to hang self  Violent Behaviors:yes ,gets violent ,had several legal charges in the past for breaking and entering ,robbery at gun point and so on.   Past Medical History:   Past Medical History  Diagnosis Date  . Bipolar affective disorder ([place])   . Schizophrenia ([place])   . ADHD (attention deficit hyperactivity disorder)   . Asthma    None. Allergies:   Allergies  Allergen Reactions  . Shrimp [Shellfish Allergy] Anaphylaxis   PTA Medications: Prescriptions prior to admission  Medication Sig Dispense Refill Last Dose  . albuterol (PROVENTIL HFA;VENTOLIN HFA) 108 (90 BASE) MCG/ACT inhaler Inhale 2 puffs into the lungs every 6 (six) hours as needed for wheezing or shortness of breath.   Unknown  . benztropine (COGENTIN) 1 MG tablet Take 1 tablet (1 mg total) by mouth 2 (two) times daily. 60 tablet 0 11/07/2015 at Unknown time  . hydrOXYzine (ATARAX/VISTARIL) 25 MG tablet Take 1 tablet (25 mg total) by mouth every 6 (six) hours as needed for anxiety. 30 tablet 0 11/07/2015 at Unknown time   Substance Abuse History in the last 12 months:  Yes.    Consequences of Substance Abuse: Medical Consequences:  recent decompensation and admission to the inpatient unit Legal Consequences:  has hx of legal problems like breaking and entering ,robbery at gun point ,[facility]. Family Consequences:  relational struggles  Social History:  reports that he has been smoking Cigarettes.  He has a 6 pack-year smoking history. He does not have any smokeless tobacco history on file. He reports that he drinks about 0.6 oz of alcohol per week. He reports that he uses illicit drugs (Marijuana). Additional Social History: Pain Medications: denies Prescriptions: denies Over the Counter: denies History of alcohol / drug use?: Yes Longest period of sobriety (when/how long): 9 months Negative Consequences of Use: Personal relationships, Legal Name of Substance 1: THC 1 -  Age of First Use: 14 1 - Amount (size/oz): .5 ounce 1 - Frequency: daily 1 - Duration: ongoing 1 - Last Use / Amount: 11/05/2015 Name of Substance 2: Alcohol 2 - Age of First Use: 14 2 - Amount (size/oz): 1/5th 2 - Frequency: daily 2 - Duration: ongoing 2 - Last Use / Amount: 11/05/2015                Current Place of Residence:  [place] of Birth: unknown Family Members:has mother Marital Status:  Single Children:denies Relationships:relational struggles Education:  dropped out of 11 th grade Educational Problems/Performance:had ADHD ,had educational problems Religious Beliefs/Practices:yes History of Abuse (Emotional/Phsycial/Sexual)-denies Occupational Experiences;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 Military History:  None. Legal History:yes ,several charges in the past ,was in [place] from 2011 -2013 as well as 2006/2007, for breaking and entering ,robbery at gun point and so on Hobbies/Interests:denies  Family History:   Family History  Problem Relation Age of Onset  . Hypertension Mother     Results for orders placed or performed during the hospital encounter of 11/08/15 (from the past 72 hour(s))  Urine rapid drug screen (hosp performed)     Status: Abnormal   Collection Time: 11/08/15  4:44 PM  Result Value  Ref Range   Opiates NONE DETECTED NONE DETECTED   Cocaine NONE DETECTED NONE DETECTED   Benzodiazepines NONE DETECTED NONE DETECTED   Amphetamines NONE DETECTED NONE DETECTED   Tetrahydrocannabinol POSITIVE (A) NONE DETECTED   Barbiturates NONE DETECTED NONE DETECTED    Comment:        DRUG SCREEN FOR MEDICAL PURPOSES ONLY.  IF CONFIRMATION IS NEEDED FOR ANY PURPOSE, NOTIFY LAB WITHIN 5 DAYS.        LOWEST DETECTABLE LIMITS FOR URINE DRUG SCREEN Drug Class       Cutoff (ng/mL) Amphetamine      1000 Barbiturate      200 Benzodiazepine   263 Tricyclics       785 Opiates          300 Cocaine          300 THC              50   CBC with  Differential     Status: None   Collection Time: 11/08/15  4:51 PM  Result Value Ref Range   WBC 7.9 4.0 - 10.5 K/uL   RBC 4.62 4.22 - 5.81 MIL/uL   Hemoglobin 13.8 13.0 - 17.0 g/dL   HCT 42.6 39.0 - 52.0 %   MCV 92.2 78.0 - 100.0 fL   MCH 29.9 26.0 - 34.0 pg   MCHC 32.4 30.0 - 36.0 g/dL   RDW 12.8 11.5 - 15.5 %   Platelets 397 150 - 400 K/uL   Neutrophils Relative % 72 %   Neutro Abs 5.7 1.7 - 7.7 K/uL   Lymphocytes Relative 23 %   Lymphs Abs 1.8 0.7 - 4.0 K/uL   Monocytes Relative 4 %   Monocytes Absolute 0.3 0.1 - 1.0 K/uL   Eosinophils Relative 1 %   Eosinophils Absolute 0.1 0.0 - 0.7 K/uL   Basophils Relative 0 %   Basophils Absolute 0.0 0.0 - 0.1 K/uL  Ethanol     Status: None   Collection Time: 11/08/15  4:51 PM  Result Value Ref Range   Alcohol, Ethyl (B) <5 <5 mg/dL    Comment:        LOWEST DETECTABLE LIMIT FOR SERUM ALCOHOL IS 5 mg/dL FOR MEDICAL PURPOSES ONLY   Comprehensive metabolic panel     Status: Abnormal   Collection Time: 11/08/15  4:51 PM  Result Value Ref Range   Sodium 140 135 - 145 mmol/L   Potassium 4.0 3.5 - 5.1 mmol/L   Chloride 104 101 - 111 mmol/L   CO2 26 22 - 32 mmol/L   Glucose, Bld 126 (H) 65 - 99 mg/dL   BUN 9 6 - 20 mg/dL   Creatinine, Ser 0.74 0.61 - 1.24 mg/dL   Calcium 9.7 8.9 - 10.3 mg/dL   Total Protein 7.6 6.5 - 8.1 g/dL   Albumin 4.3 3.5 - 5.0 g/dL   AST 20 15 - 41 U/L   ALT 25 17 - 63 U/L   Alkaline Phosphatase 93 38 - 126 U/L   Total Bilirubin 0.6 0.3 - 1.2 mg/dL   GFR calc non Af Amer >60 >60 mL/min   GFR calc Af Amer >60 >60 mL/min    Comment: (NOTE) The eGFR has been calculated using the CKD EPI equation. This calculation has not been validated in all clinical situations. eGFR's persistently <60 mL/min signify possible Chronic Kidney Disease.    Anion gap 10 5 - 15  Acetaminophen level     Status: Abnormal  Collection Time: 11/08/15  4:51 PM  Result Value Ref Range   Acetaminophen (Tylenol), Serum <10 (L) 10 -  30 ug/mL    Comment:        THERAPEUTIC CONCENTRATIONS VARY SIGNIFICANTLY. A RANGE OF 10-30 ug/mL MAY BE AN EFFECTIVE CONCENTRATION FOR MANY PATIENTS. HOWEVER, SOME ARE BEST TREATED AT CONCENTRATIONS OUTSIDE THIS RANGE. ACETAMINOPHEN CONCENTRATIONS >150 ug/mL AT 4 HOURS AFTER INGESTION AND >50 ug/mL AT 12 HOURS AFTER INGESTION ARE OFTEN ASSOCIATED WITH TOXIC REACTIONS.   Salicylate level     Status: None   Collection Time: 11/08/15  4:51 PM  Result Value Ref Range   Salicylate Lvl <8.1 2.8 - 30.0 mg/dL  Lithium level     Status: Abnormal   Collection Time: 11/08/15 10:00 PM  Result Value Ref Range   Lithium Lvl <0.06 (L) 0.60 - 1.20 mmol/L   Psychological Evaluations: [facility] INPATIENT   DSM5: Primary Psychiatric Diagnosis: Schizoaffective disorder,bipolar type, multiple episodes ,currently in acute episode   Secondary Psychiatric Diagnosis: Cannabis use disorder Hx of ADHD   Non Psychiatric Diagnosis: N/A  Past Medical History  Diagnosis Date  . Bipolar affective disorder ([place])   . Schizophrenia ([place])   . ADHD (attention deficit hyperactivity disorder)   . Asthma     Current Medications:  Current Facility-Administered Medications  Medication Dose Route Frequency Provider Last Rate Last Dose  . acetaminophen (TYLENOL) tablet 650 mg  650 mg Oral Q6H PRN Delfin Gant, NP      . alum & mag hydroxide-simeth (MAALOX/MYLANTA) 200-200-20 MG/5ML suspension 30 mL  30 mL Oral Q4H PRN Delfin Gant, NP      . benztropine (COGENTIN) tablet 0.5 mg  0.5 mg Oral BID Delfin Gant, NP   0.5 mg at 11/10/15 0800  . hydrOXYzine (ATARAX/VISTARIL) tablet 50 mg  50 mg Oral TID Delfin Gant, NP   50 mg at 11/10/15 0800  . magnesium hydroxide (MILK OF MAGNESIA) suspension 30 mL  30 mL Oral Daily PRN Delfin Gant, NP      . OLANZapine zydis (ZYPREXA) disintegrating tablet 15 mg  15 mg Oral BID PC Delfin Gant, NP   15 mg at 11/10/15 0800  . OXcarbazepine  (TRILEPTAL) tablet 600 mg  600 mg Oral BID Delfin Gant, NP   600 mg at 11/10/15 0800  . traZODone (DESYREL) tablet 100 mg  100 mg Oral QHS PRN Delfin Gant, NP       Assessment: Schizoaffective disorder ([place]), improving, nearing baseline, stable for discharge  Treatment plan: -Discharge home with resources -14 day script on crucial medications for psychotic diagnoses -Bus pass  Benjamine Mola, FNP-BC 11/10/2015 11:42 AM

## 2015-11-10 NOTE — Progress Notes (Signed)
Patient ID: Philip [name], male   DOB: 1990-12-05, 25 y.o.   MRN: [id number] Discharge Note-Seen earlier today by Renata Capriceonrad NP and he determined he could be discharged. He is currently homeless but he contacted his older brother and he is going there now. [facility] with [facility] on an emergency basis, and if needs help with wash and work. He denies any thoughts to hurt self or others and is not psychotic.  All of his property was returned to him and he got dressed and escorted to lobby for discharge. He was given two bus tickets to help with his ride to his brothers house. He was discharged with prescriptions and states he is able to fill them. [facility].

## 2015-11-10 NOTE — H&P (Signed)
[facility]BS UNIT H&P  Patient Identification:  Philip [name] Date of Evaluation:  11/10/2015 Chief Complaint: Patient states, " I have mood swings ".  Subjective: Pt seen and chart reviewed. Pt is alert/oriented x4, calm, cooperative, and appropriate to situation. Pt denies suicidal/homicidal ideation and psychosis and does not appear to be responding to internal stimuli. Pt is known to this provider for over 10 years from [place] and also psychiatric settings. He is doing well today and appears to be at his known baseline. He reports that he is homeless and that it is his primary concern. He reports that he would like to have resources for outpatient psychiatric care and housing that are more stable; social work assisting with this.   History of Present Illness: I have reviewed and concur with ED HPI elements, modified as below: AA male, 25 years old who is known to the service was evaluated today for agitation and anger issues. This is his 4th visit in a week. Patient came in stating he came to the hospital to be sent to [facility] to "sleep and chill out" Patient stated that the streets are too dangerous for him to live in. Patient stated that people are coming after him and are out to get him. Patient stated two days ago that he love to come to [facility] To eat and sleep. Patient calls staff members "my family" He has a hx of Schizoaffective disorder and is not taking medications. Patient does not see his Psychiatrist at [facility] and instead use illicit substances to treat himself. His UDS is positive for Marijuana. Patient was uncooperative and very angry towards providers during the assessment. He did not want to answer questions and stated that he needed some sleep. Patient is accepted for admission in our observation unit. We will start his medications while we continue to evaluate him for inpatient hospitalization.  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  Pt spent the night in the [facility] OBS Unit without incident.  Total Time spent with patient: 27 minutes  Psychiatric Specialty Exam: Physical Exam  Review of Systems  Psychiatric/Behavioral: Positive for depression and substance abuse. Negative for suicidal ideas and hallucinations. The patient is nervous/anxious and has insomnia.   All other systems reviewed and are negative.   Blood pressure 122/73, pulse 76, temperature 98.3 F (36.8 C), temperature source Oral, resp. rate 18, height 6' (1.829 m), weight 68.947 kg (152 lb), SpO2 99 %.Body mass index is 20.61 kg/(m^2).  General Appearance: Casual and Fairly Groomed  Eye Contact::  Minimal  Speech:  Clear and Coherent and Normal Rate  Volume:  Normal  Mood:  Anxious and Dysphoric  Affect:  Labile  Thought Process:  Coherent and Goal Directed  Orientation:  Full (Time, Place, and Person)  Thought Content:  WDL  Suicidal Thoughts:  No  Homicidal Thoughts:  No  Memory:  Immediate;   Fair Recent;   grossly intact Remote;   grossly intact  Judgement:  Fair  Insight:  Fair  Psychomotor Activity:  Increased  Concentration:  Poor  Recall:  Poor  Fund of Knowledge:Fair  Language: Good  Akathisia:  No  Handed:  Right  AIMS (if indicated):     Assets:  Physical Health Social Support  Sleep:       Musculoskeletal: Strength & Muscle Tone: within normal limits Gait & Station: normal Patient leans: N/A  Past Psychiatric History: Diagnosis:schizoaffective versus  bipolar do  [facility] ,pt also was seen by our consult team in the past.(pls see EMR)  Outpatient Care:[facility]  Substance Abuse Care:denies  Self-Mutilation:denies  Suicidal Attempts:yes ,tried to hang self  Violent Behaviors:yes ,gets violent ,had several legal charges in the past for breaking and entering ,robbery at  gun point and so on.   Past Medical History:   Past Medical History  Diagnosis Date  . Bipolar affective disorder ([place])   . Schizophrenia ([place])   . ADHD (attention deficit hyperactivity disorder)   . Asthma    None. Allergies:   Allergies  Allergen Reactions  . Shrimp [Shellfish Allergy] Anaphylaxis   PTA Medications: Prescriptions prior to admission  Medication Sig Dispense Refill Last Dose  . albuterol (PROVENTIL HFA;VENTOLIN HFA) 108 (90 BASE) MCG/ACT inhaler Inhale 2 puffs into the lungs every 6 (six) hours as needed for wheezing or shortness of breath.   Unknown  . benztropine (COGENTIN) 1 MG tablet Take 1 tablet (1 mg total) by mouth 2 (two) times daily. 60 tablet 0 11/07/2015 at Unknown time  . hydrOXYzine (ATARAX/VISTARIL) 25 MG tablet Take 1 tablet (25 mg total) by mouth every 6 (six) hours as needed for anxiety. 30 tablet 0 11/07/2015 at Unknown time   Substance Abuse History in the last 12 months:  Yes.    Consequences of Substance Abuse: Medical Consequences:  recent decompensation and admission to the inpatient unit Legal Consequences:  has hx of legal problems like breaking and entering ,robbery at gun point ,[facility]. Family Consequences:  relational struggles  Social History:  reports that he has been smoking Cigarettes.  He has a 6 pack-year smoking history. He does not have any smokeless tobacco history on file. He reports that he drinks about 0.6 oz of alcohol per week. He reports that he uses illicit drugs (Marijuana). Additional Social History: Pain Medications: denies Prescriptions: denies Over the Counter: denies History of alcohol / drug use?: Yes Longest period of sobriety (when/how long): 9 months Negative Consequences of Use: Personal relationships, Legal Name of Substance 1: THC 1 - Age of First Use: 14 1 - Amount (size/oz): .5 ounce 1 - Frequency: daily 1 - Duration: ongoing 1 - Last Use / Amount: 11/05/2015 Name of Substance 2:  Alcohol 2 - Age of First Use: 14 2 - Amount (size/oz): 1/5th 2 - Frequency: daily 2 - Duration: ongoing 2 - Last Use / Amount: 11/05/2015                Current Place of Residence:  [place] of Birth: unknown Family Members:has mother Marital Status:  Single Children:denies Relationships:relational struggles Education:  dropped out of 11 th grade Educational Problems/Performance:had ADHD ,had educational problems Religious Beliefs/Practices:yes History of Abuse (Emotional/Phsycial/Sexual)-denies Occupational Experiences;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 Military History:  None. Legal History:yes ,several charges in the past ,was in [place] from 2011 -2013 as well as 2006/2007, for breaking and entering ,robbery at gun point and so on Hobbies/Interests:denies  Family History:   Family History  Problem Relation Age of Onset  . Hypertension Mother     Results for orders placed or performed during the hospital encounter of 11/08/15 (from the past 72 hour(s))  Urine rapid drug screen (hosp performed)     Status: Abnormal   Collection Time: 11/08/15  4:44 PM  Result Value Ref Range   Opiates NONE DETECTED NONE DETECTED   Cocaine NONE DETECTED NONE DETECTED   Benzodiazepines NONE DETECTED  NONE DETECTED   Amphetamines NONE DETECTED NONE DETECTED   Tetrahydrocannabinol POSITIVE (A) NONE DETECTED   Barbiturates NONE DETECTED NONE DETECTED    Comment:        DRUG SCREEN FOR MEDICAL PURPOSES ONLY.  IF CONFIRMATION IS NEEDED FOR ANY PURPOSE, NOTIFY LAB WITHIN 5 DAYS.        LOWEST DETECTABLE LIMITS FOR URINE DRUG SCREEN Drug Class       Cutoff (ng/mL) Amphetamine      1000 Barbiturate      200 Benzodiazepine   480 Tricyclics       165 Opiates          300 Cocaine          300 THC              50   CBC with Differential     Status: None   Collection Time: 11/08/15  4:51 PM  Result Value Ref Range   WBC 7.9 4.0 - 10.5 K/uL   RBC 4.62 4.22 - 5.81 MIL/uL    Hemoglobin 13.8 13.0 - 17.0 g/dL   HCT 42.6 39.0 - 52.0 %   MCV 92.2 78.0 - 100.0 fL   MCH 29.9 26.0 - 34.0 pg   MCHC 32.4 30.0 - 36.0 g/dL   RDW 12.8 11.5 - 15.5 %   Platelets 397 150 - 400 K/uL   Neutrophils Relative % 72 %   Neutro Abs 5.7 1.7 - 7.7 K/uL   Lymphocytes Relative 23 %   Lymphs Abs 1.8 0.7 - 4.0 K/uL   Monocytes Relative 4 %   Monocytes Absolute 0.3 0.1 - 1.0 K/uL   Eosinophils Relative 1 %   Eosinophils Absolute 0.1 0.0 - 0.7 K/uL   Basophils Relative 0 %   Basophils Absolute 0.0 0.0 - 0.1 K/uL  Ethanol     Status: None   Collection Time: 11/08/15  4:51 PM  Result Value Ref Range   Alcohol, Ethyl (B) <5 <5 mg/dL    Comment:        LOWEST DETECTABLE LIMIT FOR SERUM ALCOHOL IS 5 mg/dL FOR MEDICAL PURPOSES ONLY   Comprehensive metabolic panel     Status: Abnormal   Collection Time: 11/08/15  4:51 PM  Result Value Ref Range   Sodium 140 135 - 145 mmol/L   Potassium 4.0 3.5 - 5.1 mmol/L   Chloride 104 101 - 111 mmol/L   CO2 26 22 - 32 mmol/L   Glucose, Bld 126 (H) 65 - 99 mg/dL   BUN 9 6 - 20 mg/dL   Creatinine, Ser 0.74 0.61 - 1.24 mg/dL   Calcium 9.7 8.9 - 10.3 mg/dL   Total Protein 7.6 6.5 - 8.1 g/dL   Albumin 4.3 3.5 - 5.0 g/dL   AST 20 15 - 41 U/L   ALT 25 17 - 63 U/L   Alkaline Phosphatase 93 38 - 126 U/L   Total Bilirubin 0.6 0.3 - 1.2 mg/dL   GFR calc non Af Amer >60 >60 mL/min   GFR calc Af Amer >60 >60 mL/min    Comment: (NOTE) The eGFR has been calculated using the CKD EPI equation. This calculation has not been validated in all clinical situations. eGFR's persistently <60 mL/min signify possible Chronic Kidney Disease.    Anion gap 10 5 - 15  Acetaminophen level     Status: Abnormal   Collection Time: 11/08/15  4:51 PM  Result Value Ref Range   Acetaminophen (Tylenol), Serum <10 (L) 10 -  30 ug/mL    Comment:        THERAPEUTIC CONCENTRATIONS VARY SIGNIFICANTLY. A RANGE OF 10-30 ug/mL MAY BE AN EFFECTIVE CONCENTRATION FOR MANY  PATIENTS. HOWEVER, SOME ARE BEST TREATED AT CONCENTRATIONS OUTSIDE THIS RANGE. ACETAMINOPHEN CONCENTRATIONS >150 ug/mL AT 4 HOURS AFTER INGESTION AND >50 ug/mL AT 12 HOURS AFTER INGESTION ARE OFTEN ASSOCIATED WITH TOXIC REACTIONS.   Salicylate level     Status: None   Collection Time: 11/08/15  4:51 PM  Result Value Ref Range   Salicylate Lvl <3.0 2.8 - 30.0 mg/dL  Lithium level     Status: Abnormal   Collection Time: 11/08/15 10:00 PM  Result Value Ref Range   Lithium Lvl <0.06 (L) 0.60 - 1.20 mmol/L   Psychological Evaluations: [facility] INPATIENT   DSM5: Primary Psychiatric Diagnosis: Schizoaffective disorder,bipolar type, multiple episodes ,currently in acute episode   Secondary Psychiatric Diagnosis: Cannabis use disorder Hx of ADHD   Non Psychiatric Diagnosis: N/A  Past Medical History  Diagnosis Date  . Bipolar affective disorder ([place])   . Schizophrenia ([place])   . ADHD (attention deficit hyperactivity disorder)   . Asthma     Current Medications:  Current Facility-Administered Medications  Medication Dose Route Frequency Provider Last Rate Last Dose  . acetaminophen (TYLENOL) tablet 650 mg  650 mg Oral Q6H PRN Delfin Gant, NP      . alum & mag hydroxide-simeth (MAALOX/MYLANTA) 200-200-20 MG/5ML suspension 30 mL  30 mL Oral Q4H PRN Delfin Gant, NP      . benztropine (COGENTIN) tablet 0.5 mg  0.5 mg Oral BID Delfin Gant, NP   0.5 mg at 11/10/15 0800  . hydrOXYzine (ATARAX/VISTARIL) tablet 50 mg  50 mg Oral TID Delfin Gant, NP   50 mg at 11/10/15 0800  . magnesium hydroxide (MILK OF MAGNESIA) suspension 30 mL  30 mL Oral Daily PRN Delfin Gant, NP      . OLANZapine zydis (ZYPREXA) disintegrating tablet 15 mg  15 mg Oral BID PC Delfin Gant, NP   15 mg at 11/10/15 0800  . OXcarbazepine (TRILEPTAL) tablet 600 mg  600 mg Oral BID Delfin Gant, NP   600 mg at 11/10/15 0800  . traZODone (DESYREL) tablet 100 mg  100 mg Oral  QHS PRN Delfin Gant, NP       Assessment: Schizoaffective disorder ([place]), improving, nearing baseline, stable for discharge  Treatment plan: -Discharge home with resources -14 day script on crucial medications for psychotic diagnoses -Bus pass  Benjamine Mola, FNP-BC 1/11/201711:35 AM

## 2015-11-12 ENCOUNTER — Ambulatory Visit (HOSPITAL_COMMUNITY)
Admission: RE | Admit: 2015-11-12 | Discharge: 2015-11-12 | Disposition: A | Discharge status: Home / Self Care | Payer: Federal, State, Local not specified - Other | Attending: Psychiatry | Admitting: Psychiatry

## 2015-11-12 ENCOUNTER — Emergency Department (HOSPITAL_COMMUNITY)
Admission: EM | Admit: 2015-11-12 | Discharge: 2015-11-12 | Disposition: A | Discharge status: Home / Self Care | Payer: Self-pay | Attending: Emergency Medicine | Admitting: Emergency Medicine

## 2015-11-12 ENCOUNTER — Encounter (HOSPITAL_COMMUNITY): Payer: Self-pay | Admitting: Emergency Medicine

## 2015-11-12 DIAGNOSIS — F1010 Alcohol abuse, uncomplicated: Secondary | ICD-10-CM | POA: Insufficient documentation

## 2015-11-12 DIAGNOSIS — Z79899 Other long term (current) drug therapy: Secondary | ICD-10-CM | POA: Insufficient documentation

## 2015-11-12 DIAGNOSIS — F909 Attention-deficit hyperactivity disorder, unspecified type: Secondary | ICD-10-CM | POA: Insufficient documentation

## 2015-11-12 DIAGNOSIS — F259 Schizoaffective disorder, unspecified: Secondary | ICD-10-CM | POA: Insufficient documentation

## 2015-11-12 DIAGNOSIS — Z91013 Allergy to seafood: Secondary | ICD-10-CM | POA: Insufficient documentation

## 2015-11-12 DIAGNOSIS — F1210 Cannabis abuse, uncomplicated: Secondary | ICD-10-CM | POA: Insufficient documentation

## 2015-11-12 DIAGNOSIS — F17210 Nicotine dependence, cigarettes, uncomplicated: Secondary | ICD-10-CM | POA: Insufficient documentation

## 2015-11-12 DIAGNOSIS — R45850 Homicidal ideations: Secondary | ICD-10-CM | POA: Insufficient documentation

## 2015-11-12 DIAGNOSIS — F4325 Adjustment disorder with mixed disturbance of emotions and conduct: Secondary | ICD-10-CM | POA: Diagnosis present

## 2015-11-12 DIAGNOSIS — F1220 Cannabis dependence, uncomplicated: Secondary | ICD-10-CM | POA: Insufficient documentation

## 2015-11-12 DIAGNOSIS — F319 Bipolar disorder, unspecified: Secondary | ICD-10-CM | POA: Insufficient documentation

## 2015-11-12 DIAGNOSIS — J45909 Unspecified asthma, uncomplicated: Secondary | ICD-10-CM | POA: Insufficient documentation

## 2015-11-12 DIAGNOSIS — R440 Auditory hallucinations: Secondary | ICD-10-CM | POA: Insufficient documentation

## 2015-11-12 DIAGNOSIS — F29 Unspecified psychosis not due to a substance or known physiological condition: Secondary | ICD-10-CM | POA: Insufficient documentation

## 2015-11-12 LAB — COMPREHENSIVE METABOLIC PANEL
ALBUMIN: 4.3 g/dL (ref 3.5–5.0)
ALK PHOS: 91 U/L (ref 38–126)
ALT: 19 U/L (ref 17–63)
AST: 18 U/L (ref 15–41)
Anion gap: 8 (ref 5–15)
BILIRUBIN TOTAL: 0.7 mg/dL (ref 0.3–1.2)
BUN: 12 mg/dL (ref 6–20)
CO2: 28 mmol/L (ref 22–32)
Calcium: 9.7 mg/dL (ref 8.9–10.3)
Chloride: 107 mmol/L (ref 101–111)
Creatinine, Ser: 0.88 mg/dL (ref 0.61–1.24)
GFR calc Af Amer: >60 mL/min (ref >60)
GFR calc non Af Amer: >60 mL/min (ref >60)
GLUCOSE: 116 mg/dL — AB (ref 65–99)
POTASSIUM: 4.2 mmol/L (ref 3.5–5.1)
SODIUM: 143 mmol/L (ref 135–145)
TOTAL PROTEIN: 7.3 g/dL (ref 6.5–8.1)

## 2015-11-12 LAB — CBC WITH DIFFERENTIAL/PLATELET
Basophils Absolute: 0 10*3/uL (ref 0.0–0.1)
Basophils Relative: 1 %
Eosinophils Absolute: 0.1 10*3/uL (ref 0.0–0.7)
Eosinophils Relative: 3 %
HEMATOCRIT: 42.9 % (ref 39.0–52.0)
HEMOGLOBIN: 13.8 g/dL (ref 13.0–17.0)
LYMPHS ABS: 1.8 10*3/uL (ref 0.7–4.0)
Lymphocytes Relative: 38 %
MCH: 29.4 pg (ref 26.0–34.0)
MCHC: 32.2 g/dL (ref 30.0–36.0)
MCV: 91.5 fL (ref 78.0–100.0)
MONOS PCT: 7 %
Monocytes Absolute: 0.3 10*3/uL (ref 0.1–1.0)
NEUTROS ABS: 2.4 10*3/uL (ref 1.7–7.7)
NEUTROS PCT: 51 %
Platelets: 327 10*3/uL (ref 150–400)
RBC: 4.69 MIL/uL (ref 4.22–5.81)
RDW: 12.9 % (ref 11.5–15.5)
WBC: 4.7 10*3/uL (ref 4.0–10.5)

## 2015-11-12 LAB — RAPID URINE DRUG SCREEN, HOSP PERFORMED
Amphetamines: NOT DETECTED
BARBITURATES: NOT DETECTED
Benzodiazepines: NOT DETECTED
Cocaine: NOT DETECTED
Opiates: NOT DETECTED
TETRAHYDROCANNABINOL: POSITIVE — AB

## 2015-11-12 LAB — ETHANOL: Alcohol, Ethyl (B): <5 mg/dL (ref <5)

## 2015-11-12 LAB — SALICYLATE LEVEL: Salicylate Lvl: <4 mg/dL (ref 2.8–30.0)

## 2015-11-12 LAB — ACETAMINOPHEN LEVEL

## 2015-11-12 MED ORDER — IBUPROFEN 200 MG PO TABS: 600.0000 mg | ORAL_TABLET | Freq: Three times a day (TID) | ORAL | Status: DC | PRN

## 2015-11-12 MED ORDER — BENZTROPINE MESYLATE 1 MG PO TABS
1.0000 mg | ORAL_TABLET | Freq: Two times a day (BID) | ORAL | Status: DC
  Administered 2015-11-12: 1 mg via ORAL
  Filled 2015-11-12: qty 1

## 2015-11-12 MED ORDER — ACETAMINOPHEN 325 MG PO TABS: 650.0000 mg | ORAL_TABLET | Freq: Four times a day (QID) | ORAL | Status: DC | PRN

## 2015-11-12 MED ORDER — PROMETHAZINE HCL 25 MG PO TABS
25.0000 mg | ORAL_TABLET | Freq: Four times a day (QID) | ORAL | Status: DC | PRN
Start: 2015-11-12 — End: 2015-11-12

## 2015-11-12 MED ORDER — ALBUTEROL SULFATE HFA 108 (90 BASE) MCG/ACT IN AERS: 2.0000 | INHALATION_SPRAY | Freq: Four times a day (QID) | RESPIRATORY_TRACT | Status: DC | PRN

## 2015-11-12 MED ORDER — NICOTINE POLACRILEX 2 MG MT GUM: 2.0000 mg | CHEWING_GUM | OROMUCOSAL | Status: DC | PRN

## 2015-11-12 MED ORDER — OLANZAPINE 5 MG PO TBDP: 15.0000 mg | ORAL_TABLET | Freq: Two times a day (BID) | ORAL | Status: DC

## 2015-11-12 MED ORDER — HYDROXYZINE HCL 25 MG PO TABS: 25.0000 mg | ORAL_TABLET | Freq: Four times a day (QID) | ORAL | Status: DC | PRN

## 2015-11-12 MED ORDER — OXCARBAZEPINE 300 MG PO TABS
600.0000 mg | ORAL_TABLET | Freq: Two times a day (BID) | ORAL | Status: DC
  Administered 2015-11-12: 600 mg via ORAL
  Filled 2015-11-12: qty 2

## 2015-11-12 MED ORDER — TRAZODONE HCL 100 MG PO TABS: 100.0000 mg | ORAL_TABLET | Freq: Every evening | ORAL | Status: DC | PRN

## 2015-11-12 NOTE — ED Notes (Signed)
Patient belongings: a blue duffle bag has been placed a nurses station.

## 2015-11-12 NOTE — Progress Notes (Signed)
CSW staffed with NP. CSW provided patient with [facility], and food pant[facility].  Elenore PaddyLaVonia Kirstyn Lean, LCSWA [phone] ED CSW 11/12/2015 12:56 PM

## 2015-11-12 NOTE — ED Notes (Signed)
Pt has been assessed by TTS and Inpatient treatment was recommended.  Pt does not have a bed.

## 2015-11-12 NOTE — ED Notes (Signed)
Per pt, states he is here to get meds adjusted-states he has anger issues

## 2015-11-12 NOTE — BH Assessment (Signed)
Assessment Note  Philip [name] is an 25 y.o. male. Patient was a walk-in at [facility] because of hearing voices with command and homicidal ideations.  Patient continues to endorse command hallucinations of the devil telling him to "[place], [place], Stab, Stab".  Patient has some episodes of unclear rambling during his responses.  He was just discharged from the observation unit on 11/10/2014 to his mother's home and to follow up with [facility].  Today, his mother brought him back to [facility] after following up with [facility] but was unable to get all of the patient's medications.    This Clinical research associate spoke with his sister Charisse March to collect collateral information.  It was reported that the patient in the last couple days been experiencing insomnia, hyperactive, calling her phone every 30 minutes paranoid checking on her, and not stable on his medications.  According to the sister, his mother took the patient to [facility] to get medications but found out Medicaid lapsed so unable to pay for all medications out of pocket.  Mother was willing to pay for the cheaper medications but unable to pay for one medication that cost an estimated $1000.00.  She reports since the 10/30/2014 the patient has had multiple manic episode where law enforcement had to be called to the home because he was threatening to kill him mother.  The sister reports his mother does not feel safe alone with the patient while he is unstable on his medications.  Sister reports she is contemplating getting guardianship of the patient.    This Clinical research associate consulted with Vernona Rieger, NP it is recommended to refer for inpatient hospitalization for stabilization.  If possible patient can be refer for [facility] [address].   Diagnosis: Schizoaffective Disorder  Past Medical History:  Past Medical History  Diagnosis Date  . Bipolar affective disorder (HCC)   . Schizophrenia ([facility])   . ADHD (attention deficit hyperactivity disorder)   . Asthma     Past Surgical History  Procedure  Laterality Date  . No past surgeries      Family History:  Family History  Problem Relation Age of Onset  . Hypertension Mother     Social History:  reports that he has been smoking Cigarettes.  He has a 6 pack-year smoking history. He does not have any smokeless tobacco history on file. He reports that he drinks about 0.6 oz of alcohol per week. He reports that he uses illicit drugs (Marijuana).  Additional Social History:  Alcohol / Drug Use Pain Medications: see chart Prescriptions: see chart Over the Counter: see chart History of alcohol / drug use?: Yes Longest period of sobriety (when/how long): 9 months Negative Consequences of Use: Financial, Legal, Personal relationships, Work / School Withdrawal Symptoms: Tremors Substance #1 Name of Substance 1: THC 1 - Age of First Use: 14 1 - Amount (size/oz): .5 ounce 1 - Frequency: daily 1 - Duration: ongoing  1 - Last Use / Amount: 11/10/14 Substance #2 Name of Substance 2: Alcohol 2 - Age of First Use: 14 2 - Amount (size/oz): Fifth 2 - Frequency: daily 2 - Duration: ongoing 2 - Last Use / Amount: 11/10/14 Substance #3 Name of Substance 3: Per sister's report pt been using Promethazine(Lean syrup)  CIWA: CIWA-Ar Nausea and Vomiting: no nausea and no vomiting Tactile Disturbances: none Tremor: no tremor Auditory Disturbances: not present Paroxysmal Sweats: no sweat visible Visual Disturbances: not present Anxiety: no anxiety, at ease Headache, Fullness in Head: none present Agitation: normal activity Orientation and Clouding of  Sensorium: oriented and can do serial additions CIWA-Ar Total: 0 COWS:    Allergies:  Allergies  Allergen Reactions  . Shrimp [Shellfish Allergy] Anaphylaxis    Home Medications:  (Not in a hospital admission)  OB/GYN Status:  No LMP for male patient.  General Assessment Data Location of Assessment: [facility] Assessment Services TTS Assessment: In system Is this a Tele or Face-to-Face  Assessment?: Face-to-Face Is this an Initial Assessment or a Re-assessment for this encounter?: Initial Assessment Marital status: Single Maiden name: na Is patient pregnant?: No Pregnancy Status: No Living Arrangements: Parent Can pt return to current living arrangement?: Yes Admission Status: Voluntary Is patient capable of signing voluntary admission?: Yes Referral Source: Self/Family/Friend Insurance type: none  Medical Screening Exam [facility] Walk-in ONLY) Medical Exam completed: Yes  Crisis Care Plan Living Arrangements: Parent Name of Psychiatrist: Vesta Mixer Name of Therapist: [facility]  Education Status Is patient currently in school?: No Highest grade of school patient has completed: GED Contact person: na  Risk to self with the past 6 months Suicidal Ideation: No-Not Currently/Within Last 6 Months Has patient been a risk to self within the past 6 months prior to admission? : No Suicidal Intent: No-Not Currently/Within Last 6 Months Has patient had any suicidal intent within the past 6 months prior to admission? : No Is patient at risk for suicide?: No Suicidal Plan?: No-Not Currently/Within Last 6 Months Has patient had any suicidal plan within the past 6 months prior to admission? : No Access to Means: No What has been your use of drugs/alcohol within the last 12 months?: alcohol, THC,  Previous Attempts/Gestures: No How many times?: 0 Intentional Self Injurious Behavior: None Family Suicide History: No Recent stressful life event(s): Conflict (Comment), Loss (Comment), Financial Problems, Legal Issues, Other (Comment) (SA/MH) Persecutory voices/beliefs?: Yes Depression: Yes Depression Symptoms: Insomnia, Guilt, Loss of interest in usual pleasures, Feeling worthless/self pity, Feeling angry/irritable (hopelessness) Substance abuse history and/or treatment for substance abuse?: Yes  Risk to Others within the past 6 months Homicidal Ideation: Yes-Currently  Present Does patient have any lifetime risk of violence toward others beyond the six months prior to admission? : Yes (comment) Thoughts of Harm to Others: Yes-Currently Present Comment - Thoughts of Harm to Others: Pt reports thought to harm others (Auditory hallucination of devil telling him to kill kill) Current Homicidal Intent: No-Not Currently/Within Last 6 Months Current Homicidal Plan: No-Not Currently/Within Last 6 Months Access to Homicidal Means: No Identified Victim: no identified victim History of harm to others?: Yes Assessment of Violence: In past 6-12 months Violent Behavior Description: fighting Does patient have access to weapons?: No Criminal Charges Pending?: No Does patient have a court date: No Is patient on probation?: No  Psychosis Hallucinations: Auditory, With command (devil telling me to "kill, kill, stab, stab") Delusions: Unspecified  Mental Status Report Appearance/Hygiene: Unremarkable Eye Contact: Good Motor Activity: Freedom of movement Speech: Logical/coherent Level of Consciousness: Alert Mood: Anxious Affect: Appropriate to circumstance, Anxious Anxiety Level: Minimal Thought Processes: Circumstantial Judgement: Partial Orientation: Person, Place, Time Obsessive Compulsive Thoughts/Behaviors: None  Cognitive Functioning Concentration: Fair Memory: Recent Intact, Remote Intact IQ: Average Insight: Fair Impulse Control: Fair Appetite: Fair Weight Loss: 0 Weight Gain: 0 Sleep: Decreased Total Hours of Sleep: 4 Vegetative Symptoms: None  ADLScreening [facility] Assessment Services) Patient's cognitive ability adequate to safely complete daily activities?: Yes Patient able to express need for assistance with ADLs?: Yes Independently performs ADLs?: Yes (appropriate for developmental age)  Prior Inpatient Therapy Prior Inpatient Therapy: Yes Prior  Therapy Dates: 08/2014 [facility] Reason for Treatment:  Schizoaffective Disorder  Prior Outpatient Therapy Prior Outpatient Therapy: Yes Prior Therapy Dates: 2014-Present Prior Therapy Facilty/Provider(s): [facility] Reason for Treatment: Schizoaffective disorder Does patient have an ACCT team?: No Does patient have Intensive In-House Services?  : No Does patient have [facility] services? : Yes Does patient have P4CC services?: No  ADL Screening (condition at time of admission) Patient's cognitive ability adequate to safely complete daily activities?: Yes Patient able to express need for assistance with ADLs?: Yes Independently performs ADLs?: Yes (appropriate for developmental age)       Abuse/Neglect Assessment (Assessment to be complete while patient is alone) Physical Abuse: Denies Verbal Abuse: Denies Sexual Abuse: Denies Exploitation of patient/patient's resources: Denies Self-Neglect: Denies Values / Beliefs Cultural Requests During Hospitalization: None Spiritual Requests During Hospitalization: None Consults Spiritual Care Consult Needed: No Social Work Consult Needed: No      Additional Information 1:1 In Past 12 Months?: No CIRT Risk: No Elopement Risk: No Does patient have medical clearance?: No     Disposition:  Disposition Initial Assessment Completed for this Encounter: Yes Disposition of Patient: Inpatient treatment program Type of inpatient treatment program: Adult  On Site Evaluation by:   Reviewed with Physician:    Maryelizabeth Rowanorbett, Avaiah Stempel A 11/12/2015 9:57 AM
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
## 2015-11-12 NOTE — ED Provider Notes (Addendum)
CSN: [id number]     Arrival date & time 11/12/15  [id number] History   First MD Initiated Contact with Patient 11/12/15 1109     Chief Complaint  Patient presents with  . med adjustment      (Consider location/radiation/quality/duration/timing/severity/associated sxs/prior Treatment) HPI  25 year old male presents to the ER awaiting an inpatient psychiatric bed. Patient was seen by behavioral health this morning and the nurse practitioner has recommended inpatient treatment. He has a history of bipolar and schizophrenia and was just discharged from the observation unit yesterday. Unable to fill meds due to cost. Patient has a history of angry she is and states he feels like he's having increased anger and is also having hallucinations with the devil telling him to kill people. Currently now that he is in the ER he states he feels much better and does not want to hurt anybody. Denies suicidal thoughts.  Past Medical History  Diagnosis Date  . Bipolar affective disorder (HCC)   . Schizophrenia (HCC)   . ADHD (attention deficit hyperactivity disorder)   . Asthma    Past Surgical History  Procedure Laterality Date  . No past surgeries     Family History  Problem Relation Age of Onset  . Hypertension Mother    Social History  Substance Use Topics  . Smoking status: Current Every Day Smoker -- 1.00 packs/day for 6 years    Types: Cigarettes  . Smokeless tobacco: None  . Alcohol Use: 0.6 oz/week    1 Cans of beer per week    Review of Systems  All other systems reviewed and are negative.     Allergies  Shrimp  Home Medications   Prior to Admission medications   Medication Sig Start Date End Date Taking? Authorizing Provider  albuterol (PROVENTIL HFA;VENTOLIN HFA) 108 (90 BASE) MCG/ACT inhaler Inhale 2 puffs into the lungs every 6 (six) hours as needed for wheezing or shortness of breath.   Yes Historical Provider, MD  benztropine (COGENTIN) 1 MG tablet Take 1 tablet (1 mg total)  by mouth 2 (two) times daily. 11/10/15  Yes Beau FannyJohn C Withrow, FNP  hydrOXYzine (ATARAX/VISTARIL) 25 MG tablet Take 1 tablet (25 mg total) by mouth every 6 (six) hours as needed for anxiety. 11/10/15  Yes Beau FannyJohn C Withrow, FNP  oxyCODONE-acetaminophen (PERCOCET/ROXICET) 5-325 MG tablet Take 1 tablet by mouth every 4 (four) hours as needed for severe pain.   Yes Historical Provider, MD  promethazine (PHENERGAN) 25 MG tablet Take 25 mg by mouth every 6 (six) hours as needed for nausea or vomiting.   Yes Historical Provider, MD  traZODone (DESYREL) 100 MG tablet Take 1 tablet (100 mg total) by mouth at bedtime as needed (agitation). 11/10/15  Yes Beau FannyJohn C Withrow, FNP  OLANZapine zydis (ZYPREXA) 15 MG disintegrating tablet Take 1 tablet (15 mg total) by mouth 2 (two) times daily after a meal. 11/10/15   Beau FannyJohn C Withrow, FNP  OXcarbazepine (TRILEPTAL) 600 MG tablet Take 1 tablet (600 mg total) by mouth 2 (two) times daily. 11/10/15   Everardo AllJohn C Withrow, FNP   BP 144/69 mmHg  Pulse 96  Temp(Src) 98 F (36.7 C) (Oral)  Resp 16  SpO2 100% Physical Exam  Constitutional: He is oriented to person, place, and time. He appears well-developed and well-nourished.  HENT:  Head: Normocephalic and atraumatic.  Right Ear: External ear normal.  Left Ear: External ear normal.  Nose: Nose normal.  Eyes: Right eye exhibits no discharge. Left eye exhibits no discharge.  Neck: Neck supple.  Cardiovascular: Normal rate, regular rhythm, normal heart sounds and intact distal pulses.   Pulmonary/Chest: Effort normal and breath sounds normal.  Abdominal: Soft. There is no tenderness.  Musculoskeletal: He exhibits no edema.  Neurological: He is alert and oriented to person, place, and time.  Skin: Skin is warm and dry.  Nursing note and vitals reviewed.   ED Course  Procedures (including critical care time) Labs Review Labs Reviewed  COMPREHENSIVE METABOLIC PANEL - Abnormal; Notable for the following:    Glucose, Bld 116 (*)     All other components within normal limits  URINE RAPID DRUG SCREEN, HOSP PERFORMED - Abnormal; Notable for the following:    Tetrahydrocannabinol POSITIVE (*)    All other components within normal limits  ACETAMINOPHEN LEVEL - Abnormal; Notable for the following:    Acetaminophen (Tylenol), Serum <10 (*)    All other components within normal limits  ETHANOL  CBC WITH DIFFERENTIAL/PLATELET  SALICYLATE LEVEL    Imaging Review No results found. I have personally reviewed and evaluated these images and lab results as part of my medical decision-making.   EKG Interpretation None      MDM   Final diagnoses:  Adjustment disorder with mixed disturbance of emotions and conduct    Patient is medically stable, will await an inpatient bed opening up. Home meds have been reordered.     Pricilla Loveless, MD 11/12/15 1610  Pricilla Loveless, MD 12/16/15 [phone]

## 2015-11-12 NOTE — [facility] Suicide Risk Assessment (Signed)
Suicide Risk Assessment  Discharge Assessment   [facility]   Demographic Factors:  Male and Adolescent or young adult  Total Time spent with patient: 45 minutes  Musculoskeletal: Strength & Muscle Tone: within normal limits Gait & Station: normal Patient leans: N/A  Psychiatric Specialty Exam: Review of Systems  Constitutional: Negative.   HENT: Negative.   Eyes: Negative.   Respiratory: Negative.   Cardiovascular: Negative.   Gastrointestinal: Negative.   Genitourinary: Negative.   Musculoskeletal: Negative.   Skin: Negative.   Neurological: Negative.   Endo/Heme/Allergies: Negative.   Psychiatric/Behavioral: Positive for substance abuse.    Blood pressure 113/64, pulse 72, temperature 97.6 F (36.4 C), temperature source Oral, resp. rate 16, SpO2 100 %.There is no weight on file to calculate BMI.  General Appearance: Casual  Eye Contact::  Good  Speech:  Normal Rate  Volume:  Normal  Mood:  Euthymic  Affect:  Congruent  Thought Process:  Coherent  Orientation:  Full (Time, Place, and Person)  Thought Content:  WDL  Suicidal Thoughts:  No  Homicidal Thoughts:  No  Memory:  Immediate;   Good Recent;   Good Remote;   Good  Judgement:  Fair  Insight:  Fair  Psychomotor Activity:  Normal  Concentration:  Good  Recall:  Good  Fund of Knowledge:Good  Language: Good  Akathisia:  No  Handed:  Right  AIMS (if indicated):     Assets:  Leisure Time Physical Health Resilience  ADL's:  Intact  Cognition: WNL  Sleep:         Has this patient used any form of tobacco in the last 30 days? (Cigarettes, Smokeless Tobacco, Cigars, and/or Pipes) Yes, A prescription for an FDA-approved tobacco cessation medication was offered at discharge and the patient refused  Mental Status Per Nursing Assessment::   On Admission:   Hallucinations  Current Mental Status by Physician: NA  Loss Factors: NA  Historical Factors: NA  Risk Reduction  Factors:   Sense of responsibility to family, Positive social support and Positive therapeutic relationship  Continued Clinical Symptoms:  None  Cognitive Features That Contribute To Risk:  None    Suicide Risk:  Minimal: No identifiable suicidal ideation.  Patients presenting with no risk factors but with morbid ruminations; may be classified as minimal risk based on the severity of the depressive symptoms  Principal Problem: Adjustment disorder with mixed disturbance of emotions and conduct Discharge Diagnoses:  Patient Active Problem List   Diagnosis Date Noted  . Adjustment disorder with mixed disturbance of emotions and conduct [F43.25] 11/05/2015    Priority: High  . Polysubstance abuse [F19.10] 09/16/2014    Priority: High  . Substance induced mood disorder ([facility]) [F19.94] 09/16/2014    Priority: High  . Schizoaffective disorder, bipolar type (H[facility]) [F25.0]     Priority: High  . Schizoaffective disorder (HCC) [F25.9] 11/09/2015  . Mood disorder (HCC) [F39]   . Bipolar I disorder with mania ([facility]) [F31.10]   . Agitation [R45.1] 09/16/2014  . Cannabis use disorder, severe, dependence (HCC) [F12.20]   . Hyperammonemia ([facility]) [E72.20] 09/07/2014  . Psychotic disorder [F29] 09/01/2014      Plan Of Care/Follow-up recommendations:  Activity:  as tolerated Diet:  heart healthy diet  Is patient on multiple antipsychotic therapies at discharge:  No   Has Patient had three or more failed trials of antipsychotic monotherapy by history:  No  Recommended Plan for Multiple Antipsychotic Therapies: NA    Takerra Lupinacci, PMH-NP 11/12/2015,  3:36 PM

## 2015-11-14 ENCOUNTER — Ambulatory Visit (HOSPITAL_COMMUNITY)
Admission: AD | Admit: 2015-11-14 | Discharge: 2015-11-14 | Disposition: A | Discharge status: Home / Self Care | Payer: Federal, State, Local not specified - Other | Attending: Psychiatry | Admitting: Psychiatry

## 2015-11-14 ENCOUNTER — Emergency Department (HOSPITAL_COMMUNITY)
Admission: EM | Admit: 2015-11-14 | Discharge: 2015-11-14 | Disposition: A | Discharge status: Home / Self Care | Payer: Self-pay | Attending: Emergency Medicine | Admitting: Emergency Medicine

## 2015-11-14 ENCOUNTER — Encounter (HOSPITAL_COMMUNITY): Payer: Self-pay | Admitting: Emergency Medicine

## 2015-11-14 DIAGNOSIS — F17210 Nicotine dependence, cigarettes, uncomplicated: Secondary | ICD-10-CM | POA: Insufficient documentation

## 2015-11-14 DIAGNOSIS — J45909 Unspecified asthma, uncomplicated: Secondary | ICD-10-CM | POA: Insufficient documentation

## 2015-11-14 DIAGNOSIS — F1210 Cannabis abuse, uncomplicated: Secondary | ICD-10-CM | POA: Insufficient documentation

## 2015-11-14 DIAGNOSIS — F1220 Cannabis dependence, uncomplicated: Secondary | ICD-10-CM | POA: Insufficient documentation

## 2015-11-14 DIAGNOSIS — F1010 Alcohol abuse, uncomplicated: Secondary | ICD-10-CM | POA: Insufficient documentation

## 2015-11-14 DIAGNOSIS — F209 Schizophrenia, unspecified: Secondary | ICD-10-CM | POA: Insufficient documentation

## 2015-11-14 DIAGNOSIS — Z91013 Allergy to seafood: Secondary | ICD-10-CM | POA: Insufficient documentation

## 2015-11-14 DIAGNOSIS — F319 Bipolar disorder, unspecified: Secondary | ICD-10-CM | POA: Insufficient documentation

## 2015-11-14 DIAGNOSIS — Z79899 Other long term (current) drug therapy: Secondary | ICD-10-CM | POA: Insufficient documentation

## 2015-11-14 DIAGNOSIS — R440 Auditory hallucinations: Secondary | ICD-10-CM | POA: Insufficient documentation

## 2015-11-14 DIAGNOSIS — F259 Schizoaffective disorder, unspecified: Secondary | ICD-10-CM | POA: Insufficient documentation

## 2015-11-14 DIAGNOSIS — F4325 Adjustment disorder with mixed disturbance of emotions and conduct: Secondary | ICD-10-CM | POA: Diagnosis present

## 2015-11-14 DIAGNOSIS — F909 Attention-deficit hyperactivity disorder, unspecified type: Secondary | ICD-10-CM | POA: Insufficient documentation

## 2015-11-14 DIAGNOSIS — R454 Irritability and anger: Secondary | ICD-10-CM | POA: Insufficient documentation

## 2015-11-14 DIAGNOSIS — F200 Paranoid schizophrenia: Secondary | ICD-10-CM | POA: Insufficient documentation

## 2015-11-14 LAB — CBC WITH DIFFERENTIAL/PLATELET
BASOS ABS: 0 10*3/uL (ref 0.0–0.1)
Basophils Relative: 1 %
EOS ABS: 0.1 10*3/uL (ref 0.0–0.7)
EOS PCT: 3 %
HCT: 39.1 % (ref 39.0–52.0)
Hemoglobin: 12.5 g/dL — ABNORMAL LOW (ref 13.0–17.0)
Lymphocytes Relative: 36 %
Lymphs Abs: 1.5 10*3/uL (ref 0.7–4.0)
MCH: 29.8 pg (ref 26.0–34.0)
MCHC: 32 g/dL (ref 30.0–36.0)
MCV: 93.3 fL (ref 78.0–100.0)
Monocytes Absolute: 0.3 10*3/uL (ref 0.1–1.0)
Monocytes Relative: 6 %
Neutro Abs: 2.4 10*3/uL (ref 1.7–7.7)
Neutrophils Relative %: 54 %
PLATELETS: 304 10*3/uL (ref 150–400)
RBC: 4.19 MIL/uL — AB (ref 4.22–5.81)
RDW: 12.9 % (ref 11.5–15.5)
WBC: 4.3 10*3/uL (ref 4.0–10.5)

## 2015-11-14 LAB — I-STAT CHEM 8, ED
BUN: 7 mg/dL (ref 6–20)
CALCIUM ION: 1.27 mmol/L — AB (ref 1.12–1.23)
CHLORIDE: 105 mmol/L (ref 101–111)
Creatinine, Ser: 0.9 mg/dL (ref 0.61–1.24)
Glucose, Bld: 86 mg/dL (ref 65–99)
HCT: 42 % (ref 39.0–52.0)
Hemoglobin: 14.3 g/dL (ref 13.0–17.0)
POTASSIUM: 4.7 mmol/L (ref 3.5–5.1)
SODIUM: 141 mmol/L (ref 135–145)
TCO2: 26 mmol/L (ref 0–100)

## 2015-11-14 LAB — RAPID URINE DRUG SCREEN, HOSP PERFORMED
Amphetamines: NOT DETECTED
BENZODIAZEPINES: NOT DETECTED
Barbiturates: NOT DETECTED
Cocaine: NOT DETECTED
Opiates: NOT DETECTED
Tetrahydrocannabinol: POSITIVE — AB

## 2015-11-14 LAB — ETHANOL

## 2015-11-14 MED ORDER — TRAZODONE HCL 100 MG PO TABS: 100.0000 mg | ORAL_TABLET | Freq: Every evening | ORAL | Status: DC | PRN

## 2015-11-14 MED ORDER — HYDROXYZINE HCL 25 MG PO TABS: 25.0000 mg | ORAL_TABLET | Freq: Four times a day (QID) | ORAL | Status: DC | PRN

## 2015-11-14 MED ORDER — OXCARBAZEPINE 300 MG PO TABS
600.0000 mg | ORAL_TABLET | Freq: Two times a day (BID) | ORAL | Status: DC
  Administered 2015-11-14: 600 mg via ORAL
  Filled 2015-11-14: qty 2

## 2015-11-14 MED ORDER — ACETAMINOPHEN 325 MG PO TABS: 650.0000 mg | ORAL_TABLET | ORAL | Status: DC | PRN

## 2015-11-14 MED ORDER — OLANZAPINE 5 MG PO TBDP
15.0000 mg | ORAL_TABLET | Freq: Two times a day (BID) | ORAL | Status: DC
  Administered 2015-11-14: 15 mg via ORAL
  Filled 2015-11-14: qty 1

## 2015-11-14 MED ORDER — NICOTINE 21 MG/24HR TD PT24: 21.0000 mg | MEDICATED_PATCH | Freq: Every day | TRANSDERMAL | Status: DC

## 2015-11-14 MED ORDER — BENZTROPINE MESYLATE 1 MG PO TABS
1.0000 mg | ORAL_TABLET | Freq: Two times a day (BID) | ORAL | Status: DC
  Administered 2015-11-14: 1 mg via ORAL
  Filled 2015-11-14: qty 1

## 2015-11-14 MED ORDER — IBUPROFEN 200 MG PO TABS: 600.0000 mg | ORAL_TABLET | Freq: Three times a day (TID) | ORAL | Status: DC | PRN

## 2015-11-14 NOTE — ED Notes (Signed)
[facility].  Pt states he wants to be admitted because he has anger problems.  Pt states "my on and off switch is fucked up".  Pt denies SI/HI.

## 2015-11-14 NOTE — Consult Note (Signed)
[facility] Face-to-Face [facility]iatry Consult   Reason for Consult:  Paranoia Referring Physician:  EDP Patient Identification: Philip [name] [place] MRN:  [id number] Principal Diagnosis: Adjustment disorder with mixed disturbance of emotions and conduct Diagnosis:   Patient Active Problem List   Diagnosis Date Noted  . Adjustment disorder with mixed disturbance of emotions and conduct [F43.25] 11/05/2015    Priority: High  . Polysubstance abuse [F19.10] 09/16/2014    Priority: High  . Substance induced mood disorder (HCC) [F19.94] 09/16/2014    Priority: High  . Schizoaffective disorder, bipolar type (HCC) [F25.0]     Priority: High  . Schizoaffective disorder (HCC) [F25.9] 11/09/2015  . Mood disorder (HCC) [F39]   . Bipolar I disorder with mania (HCC) [F31.10]   . Agitation [R45.1] 09/16/2014  . Cannabis use disorder, severe, dependence (HCC) [F12.20]   . Hyperammonemia (HCC) [E72.20] 09/07/2014  . Psychotic disorder [F29] 09/01/2014    Total Time spent with patient: 45 minutes  Subjective:   Philip [name] Lagasse is a 25 y.o. male patient does not warrant admission.  HPI:  Patient denies suicidal/homicidal ideations, hallucinations, and withdrawal symptoms.  He uses marijuana but denies other drugs.  This patient is well known to this ED and providers.  He fabricates many stories to get a "place to sleep and eat."  Thereasa DistanceRodney has even referred friends to the ED for the same purpose.  [facility] instead of using the resources provided for mental health and the shelters.  He is not responding to internal stimuli nor exhibiting any paranoia.  Thereasa DistanceRodney [facility] and refrain from abusing the ED when he needs a place to sleep.  Patient has reported this is main reason for the multiple visits.  Past Psychiatric History: substance abuse, schizophrenia, bipolar disorder  Risk to Self: Is patient at risk for suicide?: No, but  patient needs Medical Clearance Risk to Others:   Prior Inpatient Therapy:   Prior Outpatient Therapy:    Past Medical History:  Past Medical History  Diagnosis Date  . Bipolar affective disorder (HCC)   . Schizophrenia (HCC)   . ADHD (attention deficit hyperactivity disorder)   . Asthma     Past Surgical History  Procedure Laterality Date  . No past surgeries     Family History:  Family History  Problem Relation Age of Onset  . Hypertension Mother    Family Psychiatric  History: Mother Social History:  History  Alcohol Use  . 0.6 oz/week  . 1 Cans of beer per week     History  Drug Use  . Yes  . Special: Marijuana    Social History   Social History  . Marital Status: Married    Spouse Name: N/A  . Number of Children: N/A  . Years of Education: N/A   Social History Main Topics  . Smoking status: Current Every Day Smoker -- 1.00 packs/day for 6 years    Types: Cigarettes  . Smokeless tobacco: None  . Alcohol Use: 0.6 oz/week    1 Cans of beer per week  . Drug Use: Yes    Special: Marijuana  . Sexual Activity: Yes   Other Topics Concern  . None   Social History Narrative   Additional Social History:                          Allergies:  Allergies  Allergen Reactions  . Shrimp [Shellfish Allergy] Anaphylaxis    Labs:  Results for orders placed or performed during the hospital encounter of 11/14/15 (from the past 48 hour(s))  CBC with Differential/Platelet     Status: Abnormal   Collection Time: 11/14/15  8:31 AM  Result Value Ref Range   WBC 4.3 4.0 - 10.5 K/uL   RBC 4.19 (L) 4.22 - 5.81 MIL/uL   Hemoglobin 12.5 (L) 13.0 - 17.0 g/dL   HCT 16.1 09.6 - 04.5 %   MCV 93.3 78.0 - 100.0 fL   MCH 29.8 26.0 - 34.0 pg   MCHC 32.0 30.0 - 36.0 g/dL   RDW 40.9 81.1 - 91.4 %   Platelets 304 150 - 400 K/uL   Neutrophils Relative % 54 %   Neutro Abs 2.4 1.7 - 7.7 K/uL   Lymphocytes Relative 36 %   Lymphs Abs 1.5 0.7 - 4.0 K/uL   Monocytes  Relative 6 %   Monocytes Absolute 0.3 0.1 - 1.0 K/uL   Eosinophils Relative 3 %   Eosinophils Absolute 0.1 0.0 - 0.7 K/uL   Basophils Relative 1 %   Basophils Absolute 0.0 0.0 - 0.1 K/uL  Ethanol     Status: None   Collection Time: 11/14/15  8:31 AM  Result Value Ref Range   Alcohol, Ethyl (B) <5 <5 mg/dL    Comment:        LOWEST DETECTABLE LIMIT FOR SERUM ALCOHOL IS 5 mg/dL FOR MEDICAL PURPOSES ONLY   Urine rapid drug screen (hosp performed)     Status: Abnormal   Collection Time: 11/14/15  8:39 AM  Result Value Ref Range   Opiates NONE DETECTED NONE DETECTED   Cocaine NONE DETECTED NONE DETECTED   Benzodiazepines NONE DETECTED NONE DETECTED   Amphetamines NONE DETECTED NONE DETECTED   Tetrahydrocannabinol POSITIVE (A) NONE DETECTED   Barbiturates NONE DETECTED NONE DETECTED    Comment:        DRUG SCREEN FOR MEDICAL PURPOSES ONLY.  IF CONFIRMATION IS NEEDED FOR ANY PURPOSE, NOTIFY LAB WITHIN 5 DAYS.        LOWEST DETECTABLE LIMITS FOR URINE DRUG SCREEN Drug Class       Cutoff (ng/mL) Amphetamine      1000 Barbiturate      200 Benzodiazepine   200 Tricyclics       300 Opiates          300 Cocaine          300 THC              50   I-stat chem 8, ed     Status: Abnormal   Collection Time: 11/14/15  8:41 AM  Result Value Ref Range   Sodium 141 135 - 145 mmol/L   Potassium 4.7 3.5 - 5.1 mmol/L   Chloride 105 101 - 111 mmol/L   BUN 7 6 - 20 mg/dL   Creatinine, Ser 7.82 0.61 - 1.24 mg/dL   Glucose, Bld 86 65 - 99 mg/dL   Calcium, Ion 9.56 (H) 1.12 - 1.23 mmol/L   TCO2 26 0 - 100 mmol/L   Hemoglobin 14.3 13.0 - 17.0 g/dL   HCT 21.3 08.6 - 57.8 %    Current Facility-Administered Medications  Medication Dose Route Frequency Provider Last Rate Last Dose  . acetaminophen (TYLENOL) tablet 650 mg  650 mg Oral Q4H PRN Gwyneth Sprout, MD      . benztropine (COGENTIN) tablet 1 mg  1 mg  Oral BID Gwyneth Sprout, MD   1 mg at 11/14/15 0954  . hydrOXYzine  (ATARAX/VISTARIL) tablet 25 mg  25 mg Oral Q6H PRN Gwyneth Sprout, MD      . ibuprofen (ADVIL,MOTRIN) tablet 600 mg  600 mg Oral Q8H PRN Gwyneth Sprout, MD      . nicotine (NICODERM CQ - dosed in mg/24 hours) patch 21 mg  21 mg Transdermal Daily Gwyneth Sprout, MD      . OLANZapine zydis (ZYPREXA) disintegrating tablet 15 mg  15 mg Oral BID PC Gwyneth Sprout, MD   15 mg at 11/14/15 0954  . Oxcarbazepine (TRILEPTAL) tablet 600 mg  600 mg Oral BID Gwyneth Sprout, MD   600 mg at 11/14/15 0954  . traZODone (DESYREL) tablet 100 mg  100 mg Oral QHS PRN Gwyneth Sprout, MD       Current Outpatient Prescriptions  Medication Sig Dispense Refill  . albuterol (PROVENTIL HFA;VENTOLIN HFA) 108 (90 BASE) MCG/ACT inhaler Inhale 2 puffs into the lungs every 6 (six) hours as needed for wheezing or shortness of breath.    . amphetamine-dextroamphetamine (ADDERALL) 5 MG tablet Take 5 mg by mouth 2 (two) times daily with a meal.    . benztropine (COGENTIN) 1 MG tablet Take 1 tablet (1 mg total) by mouth 2 (two) times daily. 28 tablet 0  . clonazePAM (KLONOPIN) 0.5 MG tablet Take 0.5 mg by mouth 3 (three) times daily as needed for anxiety.    . hydrOXYzine (ATARAX/VISTARIL) 25 MG tablet Take 1 tablet (25 mg total) by mouth every 6 (six) hours as needed for anxiety. 30 tablet 0  . OLANZapine zydis (ZYPREXA) 15 MG disintegrating tablet Take 1 tablet (15 mg total) by mouth 2 (two) times daily after a meal. 28 tablet 0  . OXcarbazepine (TRILEPTAL) 600 MG tablet Take 1 tablet (600 mg total) by mouth 2 (two) times daily. 28 tablet 0  . oxyCODONE-acetaminophen (PERCOCET/ROXICET) 5-325 MG tablet Take 1 tablet by mouth every 4 (four) hours as needed for severe pain.    . promethazine (PHENERGAN) 25 MG tablet Take 25 mg by mouth every 6 (six) hours as needed for nausea or vomiting.    . traZODone (DESYREL) 100 MG tablet Take 1 tablet (100 mg total) by mouth at bedtime as needed (agitation). 14 tablet 0     Musculoskeletal: Strength & Muscle Tone: within normal limits Gait & Station: normal Patient leans: N/A  Psychiatric Specialty Exam: Review of Systems  Constitutional: Negative.   HENT: Negative.   Eyes: Negative.   Respiratory: Negative.   Cardiovascular: Negative.   Gastrointestinal: Negative.   Genitourinary: Negative.   Musculoskeletal: Negative.   Skin: Negative.   Neurological: Negative.   Endo/Heme/Allergies: Negative.   Psychiatric/Behavioral: Positive for substance abuse.    Blood pressure 138/75, pulse 88, temperature 97.8 F (36.6 C), temperature source Oral, resp. rate 18, SpO2 100 %.There is no weight on file to calculate BMI.  General Appearance: Casual  Eye Contact::  Good  Speech:  Normal Rate  Volume:  Normal  Mood:  Euthymic  Affect:  Congruent  Thought Process:  Coherent  Orientation:  Full (Time, Place, and Person)  Thought Content:  WDL  Suicidal Thoughts:  No  Homicidal Thoughts:  No  Memory:  Immediate;   Good Recent;   Good Remote;   Good  Judgement:  Fair  Insight:  Fair  Psychomotor Activity:  Normal  Concentration:  Good  Recall:  Good  Fund of Knowledge:Good  Language: Good  Akathisia:  No  Handed:  Right  AIMS (if indicated):     Assets:  Leisure Time Physical Health Resilience  ADL's:  Intact  Cognition: WNL  Sleep:      Treatment Plan Summary: Daily contact with patient to assess and evaluate symptoms and progress in treatment, Medication management and Plan adjustment disorder with disturbance of emotions and conduct:  -Crisis stabiliziation -Individual and substance abuse counseling -Medication Management:  No changes to his medications--continued the Zyprexa 15 mg BID for psychosis, Trileptal 600 mg BID for mood stabilization, Trazodone 100 mg at bedtime for sleep PRN, Vistaril 25 mg every 6 hours PRN anxiety, and Cogentin 1 mg BID to prevent EPS -Shelter resources provided with a bus pass and snacks  Disposition: No  evidence of imminent risk to self or others at present.    Nanine Means, PMH-NP 11/14/2015 12:14 PM Patient seen face-to-face for psychiatric evaluation, chart reviewed and case discussed with the physician extender and developed treatment plan. Reviewed the information documented and agree with the treatment plan. Thedore Mins, MD

## 2015-11-14 NOTE — BH Assessment (Signed)
Tele Assessment Note   Philip [name] is an 25 y.o. male, single, African-American who presents unaccompanied to [facility] after being dropped off by his sister. Pt has a history of schizoaffective disorder and was discharged from [facility] on 11/12/15 and discharged from [facility] 11/10/15. He states he feels manic and is having racing thoughts, paranoia and that the devil is telling him to do bad stuff. He reports he has not slept in 72 hours. He says he has anger problems and that his mood switches like a light switch. He denies current suicidal ideation or homicidal ideation. He reports having visual hallucinations but is vague in terms of what he has experienced. He has a history of abusing alcohol and marijuana but denies any use in four days. Pt states he is compliant with medications. He states he is receiving outpatient treatment through [facility]. He reports he is living with his sister and can return to that residence.  Pt is casually dressed, alert, oriented x4 with slightly rapid speech and restless motor behavior. Eye contact is good. Pt's mood is euthymic and affect is slightly labile. Thought process is coherent and relevant. There is no indication Pt is currently responding to internal stimuli or experiencing delusional thought content. Pt was pleasant and cooperative throughout assessment. He wants to be admitted for inpatient treatment.   Diagnosis: Schizoaffective Disorder  Past Medical History:  Past Medical History  Diagnosis Date  . Bipolar affective disorder (HCC)   . Schizophrenia (HCC)   . ADHD (attention deficit hyperactivity disorder)   . Asthma     Past Surgical History  Procedure Laterality Date  . No past surgeries      Family History:  Family History  Problem Relation Age of Onset  . Hypertension Mother     Social History:  reports that he has been smoking Cigarettes.  He has a 6 pack-year smoking history. He does not have any smokeless tobacco  history on file. He reports that he drinks about 0.6 oz of alcohol per week. He reports that he uses illicit drugs (Marijuana).  Additional Social History:  Alcohol / Drug Use Pain Medications: see chart Prescriptions: see chart Over the Counter: see chart History of alcohol / drug use?: Yes Longest period of sobriety (when/how long): 9 months Negative Consequences of Use: Financial, Legal, Personal relationships, Work / School Withdrawal Symptoms: Tremors Substance #1 Name of Substance 1: THC 1 - Age of First Use: 14 1 - Amount (size/oz): .5 ounce 1 - Frequency: daily 1 - Duration: ongoing  1 - Last Use / Amount: 11/10/14 Substance #2 Name of Substance 2: Alcohol 2 - Age of First Use: 14 2 - Amount (size/oz): Fifth 2 - Frequency: daily 2 - Duration: ongoing 2 - Last Use / Amount: 11/10/14  CIWA:   COWS:    PATIENT STRENGTHS: (choose at least two) Ability for insight Active sense of humor Average or above average intelligence Capable of independent living Communication skills Motivation for treatment/growth Physical Health Supportive family/friends  Allergies:  Allergies  Allergen Reactions  . Shrimp [Shellfish Allergy] Anaphylaxis    Home Medications:  (Not in a hospital admission)  OB/GYN Status:  No LMP for male patient.  General Assessment Data Location of Assessment: [facility] Assessment Services TTS Assessment: In system Is this a Tele or Face-to-Face Assessment?: Face-to-Face Is this an Initial Assessment or a Re-assessment for this encounter?: Initial Assessment Marital status: Single Maiden name: NA Is patient pregnant?: No Pregnancy Status: No Living  Arrangements: Other (Comment) (Sister) Can pt return to current living arrangement?: Yes Admission Status: Voluntary Is patient capable of signing voluntary admission?: Yes Referral Source: Self/Family/Friend Insurance type: Self-pay  Medical Screening Exam [facility] Walk-in ONLY) Medical Exam completed:  No Reason for MSE not completed: Other: (Pt transferred to [facility] for medical clearance)  Crisis Care Plan Living Arrangements: Other (Comment) (Sister) Legal Guardian: Other: (None) Name of Psychiatrist: [facility] Name of Therapist: [facility]  Education Status Is patient currently in school?: No Current Grade: NA Highest grade of school patient has completed: GED Name of school: na Contact person: na  Risk to self with the past 6 months Suicidal Ideation: No Has patient been a risk to self within the past 6 months prior to admission? : No Suicidal Intent: No Has patient had any suicidal intent within the past 6 months prior to admission? : No Is patient at risk for suicide?: No Suicidal Plan?: No Has patient had any suicidal plan within the past 6 months prior to admission? : No Access to Means: No What has been your use of drugs/alcohol within the last 12 months?: Pt using alcohol and marijuana Previous Attempts/Gestures: No How many times?: 0 Other Self Harm Risks: None Triggers for Past Attempts: None known Intentional Self Injurious Behavior: None Family Suicide History: No Recent stressful life event(s): Job Loss, Financial Problems Persecutory voices/beliefs?: No Depression: Yes Depression Symptoms: Feeling angry/irritable, Insomnia Substance abuse history and/or treatment for substance abuse?: Yes Suicide prevention information given to non-admitted patients: Not applicable  Risk to Others within the past 6 months Homicidal Ideation: No Does patient have any lifetime risk of violence toward others beyond the six months prior to admission? : Yes (comment) Thoughts of Harm to Others: Yes-Currently Present Comment - Thoughts of Harm to Others: Episodic thoughts of harming people Current Homicidal Intent: No Current Homicidal Plan: No Access to Homicidal Means: No Identified Victim: None History of harm to others?: No Assessment of Violence: In past 6-12 months Violent  Behavior Description: History of aggressive and threatening behavior Does patient have access to weapons?: No Criminal Charges Pending?: No Does patient have a court date: No Is patient on probation?: No  Psychosis Hallucinations: None noted Delusions: Persecutory  Mental Status Report Appearance/Hygiene: Other (Comment) (Casually dressed) Eye Contact: Good Motor Activity: Restlessness Speech: Logical/coherent Level of Consciousness: Alert Mood: Pleasant Affect: Labile Anxiety Level: None Thought Processes: Coherent, Relevant Judgement: Partial Orientation: Person, Place, Time, Situation, Appropriate for developmental age Obsessive Compulsive Thoughts/Behaviors: None  Cognitive Functioning Concentration: Fair Memory: Recent Intact, Remote Intact IQ: Average Insight: Fair Impulse Control: Fair Appetite: Good Weight Loss: 0 Weight Gain: 0 Sleep: Decreased Total Hours of Sleep: 0 (No sleep in three days) Vegetative Symptoms: None  ADLScreening [facility] Assessment Services) Patient's cognitive ability adequate to safely complete daily activities?: Yes Patient able to express need for assistance with ADLs?: Yes Independently performs ADLs?: Yes (appropriate for developmental age)  Prior Inpatient Therapy Prior Inpatient Therapy: Yes Prior Therapy Dates: 08/2014 Prior Therapy Facilty/Provider(s): [facility] Reason for Treatment: Schizoaffective Disorder  Prior Outpatient Therapy Prior Outpatient Therapy: Yes Prior Therapy Dates: 2014-Present Prior Therapy Facilty/Provider(s): [facility] Reason for Treatment: Schizoaffective disorder Does patient have an ACCT team?: No Does patient have Intensive In-House Services?  : No Does patient have [facility] services? : Yes Does patient have P4CC services?: No  ADL Screening (condition at time of admission) Patient's cognitive ability adequate to safely complete daily activities?: Yes Is the patient deaf or have difficulty hearing?:  No Does  the patient have difficulty seeing, even when wearing glasses/contacts?: No Does the patient have difficulty concentrating, remembering, or making decisions?: No Patient able to express need for assistance with ADLs?: Yes Does the patient have difficulty dressing or bathing?: No Independently performs ADLs?: Yes (appropriate for developmental age) Does the patient have difficulty walking or climbing stairs?: No Weakness of Legs: None Weakness of Arms/Hands: None  Home Assistive Devices/Equipment Home Assistive Devices/Equipment: None    Abuse/Neglect Assessment (Assessment to be complete while patient is alone) Physical Abuse: Denies Verbal Abuse: Denies Sexual Abuse: Denies Exploitation of patient/patient's resources: Denies Self-Neglect: Denies     Merchant navy officerAdvance Directives (For Healthcare) Does patient have an advance directive?: No Would patient like information on creating an advanced directive?: No - patient declined information    Additional Information 1:1 In Past 12 Months?: No CIRT Risk: No Elopement Risk: No Does patient have medical clearance?: No     Disposition: Binnie RailJoann Glover, [facility], confirmed adult unit is at capacity. Gave clinical report to Maryjean Mornharles Kober, PA who [facility]. [facility]. Contacted Lyla Sonarrie, Consulting civil engineercharge RN at [facility], and gave report. [facility] [employer] [facility].  Disposition Initial Assessment Completed for this Encounter: Yes Disposition of Patient: Other dispositions Type of inpatient treatment program: Adult Other disposition(s): Other (Comment) ([facility])   Harlin RainFord Ellis Patsy BaltimoreWarrick Jr, [facility], [facility], [facility] Triage Specialist [phone]   Patsy BaltimoreWarrick Jr, Harlin RainFord Ellis 11/14/2015 7:15 AM

## 2015-11-14 NOTE — ED Notes (Signed)
Philip DistanceRodney feels that he can utilize his outside resources to manage his mental health and medications.  He denies SI/HI.  Support and encouragement given.
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
## 2015-11-14 NOTE — [facility] Suicide Risk Assessment (Signed)
Suicide Risk Assessment  Discharge Assessment   [facility] Discharge Suicide Risk Assessment   Demographic Factors:  Male and Adolescent or young adult  Total Time spent with patient: 45 minutes  Musculoskeletal: Strength & Muscle Tone: within normal limits Gait & Station: normal Patient leans: N/A  Psychiatric Specialty Exam: Review of Systems  Constitutional: Negative.   HENT: Negative.   Eyes: Negative.   Respiratory: Negative.   Cardiovascular: Negative.   Gastrointestinal: Negative.   Genitourinary: Negative.   Musculoskeletal: Negative.   Skin: Negative.   Neurological: Negative.   Endo/Heme/Allergies: Negative.   Psychiatric/Behavioral: Positive for substance abuse.    Blood pressure 138/75, pulse 88, temperature 97.8 F (36.6 C), temperature source Oral, resp. rate 18, SpO2 100 %.There is no weight on file to calculate BMI.  General Appearance: Casual  Eye Contact::  Good  Speech:  Normal Rate  Volume:  Normal  Mood:  Euthymic  Affect:  Congruent  Thought Process:  Coherent  Orientation:  Full (Time, Place, and Person)  Thought Content:  WDL  Suicidal Thoughts:  No  Homicidal Thoughts:  No  Memory:  Immediate;   Good Recent;   Good Remote;   Good  Judgement:  Fair  Insight:  Fair  Psychomotor Activity:  Normal  Concentration:  Good  Recall:  Good  Fund of Knowledge:Good  Language: Good  Akathisia:  No  Handed:  Right  AIMS (if indicated):     Assets:  Leisure Time Physical Health Resilience  ADL's:  Intact  Cognition: WNL  Sleep:         Has this patient used any form of tobacco in the last 30 days? (Cigarettes, Smokeless Tobacco, Cigars, and/or Pipes) Yes, A prescription for an FDA-approved tobacco cessation medication was offered at discharge and the patient refused  Mental Status Per Nursing Assessment::   On Admission:   Paranoia  Current Mental Status by Physician: NA  Loss Factors: NA  Historical Factors: NA  Risk Reduction Factors:    Sense of responsibility to family and Positive coping skills or problem solving skills  Continued Clinical Symptoms:  None  Cognitive Features That Contribute To Risk:  None    Suicide Risk:  Minimal: No identifiable suicidal ideation.  Patients presenting with no risk factors but with morbid ruminations; may be classified as minimal risk based on the severity of the depressive symptoms  Principal Problem: Adjustment disorder with mixed disturbance of emotions and conduct Discharge Diagnoses:  Patient Active Problem List   Diagnosis Date Noted  . Adjustment disorder with mixed disturbance of emotions and conduct [F43.25] 11/05/2015    Priority: High  . Polysubstance abuse [F19.10] 09/16/2014    Priority: High  . Substance induced mood disorder (HCC) [F19.94] 09/16/2014    Priority: High  . Schizoaffective disorder, bipolar type (HCC) [F25.0]     Priority: High  . Schizoaffective disorder (HCC) [F25.9] 11/09/2015  . Mood disorder (HCC) [F39]   . Bipolar I disorder with mania (HCC) [F31.10]   . Agitation [R45.1] 09/16/2014  . Cannabis use disorder, severe, dependence (HCC) [F12.20]   . Hyperammonemia (HCC) [E72.20] 09/07/2014  . Psychotic disorder [F29] 09/01/2014      Plan Of Care/Follow-up recommendations:  Activity:  as tolerated Diet:  heart healthy diet  Is patient on multiple antipsychotic therapies at discharge:  No   Has Patient had three or more failed trials of antipsychotic monotherapy by history:  No  Recommended Plan for Multiple Antipsychotic Therapies: NA    Philip [name], PMH-NP  11/14/2015, 12:24 PM

## 2015-11-15 ENCOUNTER — Ambulatory Visit (HOSPITAL_COMMUNITY)
Admission: AD | Admit: 2015-11-15 | Discharge: 2015-11-15 | Disposition: A | Discharge status: Home / Self Care | Payer: Federal, State, Local not specified - Other | Source: Intra-hospital | Attending: Psychiatry | Admitting: Psychiatry

## 2015-11-15 DIAGNOSIS — F17210 Nicotine dependence, cigarettes, uncomplicated: Secondary | ICD-10-CM | POA: Insufficient documentation

## 2015-11-15 DIAGNOSIS — Z91013 Allergy to seafood: Secondary | ICD-10-CM | POA: Insufficient documentation

## 2015-11-15 DIAGNOSIS — F1220 Cannabis dependence, uncomplicated: Secondary | ICD-10-CM | POA: Insufficient documentation

## 2015-11-15 DIAGNOSIS — F209 Schizophrenia, unspecified: Secondary | ICD-10-CM | POA: Insufficient documentation

## 2015-11-15 DIAGNOSIS — Z590 Homelessness: Secondary | ICD-10-CM | POA: Insufficient documentation

## 2015-11-15 DIAGNOSIS — F909 Attention-deficit hyperactivity disorder, unspecified type: Secondary | ICD-10-CM | POA: Insufficient documentation

## 2015-11-15 DIAGNOSIS — J45909 Unspecified asthma, uncomplicated: Secondary | ICD-10-CM | POA: Insufficient documentation

## 2015-11-15 DIAGNOSIS — F4325 Adjustment disorder with mixed disturbance of emotions and conduct: Secondary | ICD-10-CM | POA: Insufficient documentation

## 2015-11-15 DIAGNOSIS — F319 Bipolar disorder, unspecified: Secondary | ICD-10-CM | POA: Insufficient documentation

## 2015-11-15 DIAGNOSIS — F1010 Alcohol abuse, uncomplicated: Secondary | ICD-10-CM | POA: Insufficient documentation

## 2015-11-15 NOTE — BH Assessment (Addendum)
Tele Assessment Note   Philip [name] is an 25 y.o. male, single, African-American who [facility] inpatient treatment "so I can learn to function in a regular atmosphere." [facility] less than 24-hours ago, [facility] and [facility] (see additional clinical notes for additional details). Pt says he went home, slept and then his sister brought him [facility] so he "can get a bed." Pt denies current suicidal ideation, homicidal ideation or psychotic symptoms. He denies using any alcohol or drugs in several days. Pt appears less hypomanic than yesterday. Pt states he is homeless and cannot return to his sister's house. He doesn't have any other supports.  Pt is casually dressed, alert, oriented x4 with normal speech and normal motor behavior. Eye contact is good. Pt's mood is euthymic and affect is congruent with mood. Thought process is coherent and relevant. There is no indication Pt is currently responding to internal stimuli or experiencing delusional thought content. Pt was calm and cooperative throughout assessment.    Diagnosis: Adjustment disorder with mixed disturbance of emotions and conduct; Alcohol Use Disorder; Cannabis Use Disorder  Past Medical History:  Past Medical History  Diagnosis Date  . Bipolar affective disorder (HCC)   . Schizophrenia (HCC)   . ADHD (attention deficit hyperactivity disorder)   . Asthma     Past Surgical History  Procedure Laterality Date  . No past surgeries      Family History:  Family History  Problem Relation Age of Onset  . Hypertension Mother     Social History:  reports that he has been smoking Cigarettes.  He has a 6 pack-year smoking history. He does not have any smokeless tobacco history on file. He reports that he drinks about 0.6 oz of alcohol per week. He reports that he uses illicit drugs (Marijuana).  Additional Social  History:  Alcohol / Drug Use Pain Medications: see chart Prescriptions: see chart Over the Counter: see chart History of alcohol / drug use?: Yes Longest period of sobriety (when/how long): 9 months Negative Consequences of Use: Financial, Legal, Personal relationships, Work / School Withdrawal Symptoms: Tremors Substance #1 Name of Substance 1: THC 1 - Age of First Use: 14 1 - Amount (size/oz): .5 ounce 1 - Frequency: daily 1 - Duration: ongoing  1 - Last Use / Amount: 11/10/14 Substance #2 Name of Substance 2: Alcohol 2 - Age of First Use: 14 2 - Amount (size/oz): Fifth 2 - Frequency: daily 2 - Duration: ongoing 2 - Last Use / Amount: 11/10/14  CIWA:   COWS:    PATIENT STRENGTHS: (choose at least two) Ability for insight Average or above average intelligence Capable of independent living Communication skills General fund of knowledge Motivation for treatment/growth Physical Health Supportive family/friends  Allergies:  Allergies  Allergen Reactions  . Shrimp [Shellfish Allergy] Anaphylaxis    Home Medications:  (Not in a hospital admission)  OB/GYN Status:  No LMP for male patient.  General Assessment Data [facility] TTS Assessment: In system Is this a Tele or Face-to-Face Assessment?: Face-to-Face Is this an Initial Assessment or a Re-assessment for this encounter?: Initial Assessment Marital status: Single Maiden name: NA Is patient pregnant?: No Pregnancy Status: No Living Arrangements: Other (Comment) (Homeless) Can pt return to current living arrangement?: Yes Admission Status: Voluntary Is patient capable of signing voluntary admission?: Yes Referral Source: Self/Family/Friend Insurance type: Self-pay  Medical Screening Exam [facility] Walk-in ONLY) Medical Exam completed: No Reason for MSE not completed: Patient Refused  Crisis Care Plan Living Arrangements: Other (Comment) (Homeless) Legal Guardian: Other:  (None) Name of Psychiatrist: [facility] Name of Therapist: [facility]  Education Status Is patient currently in school?: No Current Grade: NA Highest grade of school patient has completed: GED Name of school: NA Contact person: NA  Risk to self with the past 6 months Suicidal Ideation: No Has patient been a risk to self within the past 6 months prior to admission? : No Suicidal Intent: No Has patient had any suicidal intent within the past 6 months prior to admission? : No Is patient at risk for suicide?: No Suicidal Plan?: No Has patient had any suicidal plan within the past 6 months prior to admission? : No Access to Means: No What has been your use of drugs/alcohol within the last 12 months?: Pt uses alcohol and marijuana Previous Attempts/Gestures: No How many times?: 0 Other Self Harm Risks: None Triggers for Past Attempts: None known Intentional Self Injurious Behavior: None Family Suicide History: No Recent stressful life event(s): Job Loss, Financial Problems, Other (Comment) (Homeless) Persecutory voices/beliefs?: No Depression: Yes Depression Symptoms: Despondent, Feeling angry/irritable Substance abuse history and/or treatment for substance abuse?: Yes Suicide prevention information given to non-admitted patients: Yes  Risk to Others within the past 6 months Homicidal Ideation: No Does patient have any lifetime risk of violence toward others beyond the six months prior to admission? : Yes (comment) Thoughts of Harm to Others: No Comment - Thoughts of Harm to Others: No current thoughts of harming other Current Homicidal Intent: No Current Homicidal Plan: No Access to Homicidal Means: No Identified Victim: None History of harm to others?: No Assessment of Violence: In past 6-12 months Violent Behavior Description: History of aggressive and threatening behavior Does patient have access to weapons?: No Criminal Charges Pending?: No Does patient have a court date:  No Is patient on probation?: No  Psychosis Hallucinations: None noted Delusions: None noted  Mental Status Report Appearance/Hygiene: Other (Comment) (Casually dressed) Eye Contact: Good Motor Activity: Unremarkable Speech: Logical/coherent Level of Consciousness: Alert Mood: Euthymic Affect: Appropriate to circumstance Anxiety Level: None Thought Processes: Coherent, Relevant Judgement: Unimpaired Orientation: Person, Place, Time, Situation, Appropriate for developmental age Obsessive Compulsive Thoughts/Behaviors: None  Cognitive Functioning Concentration: Normal Memory: Recent Intact, Remote Intact IQ: Average Insight: Fair Impulse Control: Fair Appetite: Good Weight Loss: 0 Weight Gain: 0 Sleep: No Change Total Hours of Sleep: 7 Vegetative Symptoms: None  ADLScreening [facility] Assessment Services) Patient's cognitive ability adequate to safely complete daily activities?: Yes Patient able to express need for assistance with ADLs?: Yes Independently performs ADLs?: Yes (appropriate for developmental age)  Prior Inpatient Therapy Prior Inpatient Therapy: Yes Prior Therapy Dates: 08/2014 Prior Therapy Facilty/Provider(s): [facility] Reason for Treatment: Schizoaffective Disorder  Prior Outpatient Therapy Prior Outpatient Therapy: Yes Prior Therapy Dates: 2014-Present Prior Therapy Facilty/Provider(s): [facility] Reason for Treatment: Schizoaffective disorder Does patient have an ACCT team?: No Does patient have Intensive In-House Services?  : No Does patient have [facility] services? : Yes Does patient have P4CC services?: No  ADL Screening (condition at time of admission) Patient's cognitive ability adequate to safely complete daily activities?: Yes Is the patient deaf or have difficulty hearing?: No Does the patient have difficulty seeing, even when wearing glasses/contacts?: No Does the patient have difficulty concentrating, remembering, or making decisions?: No Patient  able to express need for assistance with ADLs?: Yes Does the patient have  difficulty dressing or bathing?: No Independently performs ADLs?: Yes (appropriate for developmental age) Does the patient have difficulty walking or climbing stairs?: No Weakness of Legs: None Weakness of Arms/Hands: None  Home Assistive Devices/Equipment Home Assistive Devices/Equipment: None    Abuse/Neglect Assessment (Assessment to be complete while patient is alone) Physical Abuse: Denies Verbal Abuse: Denies Sexual Abuse: Denies Exploitation of patient/patient's resources: Denies Self-Neglect: Denies     Merchant navy officer (For Healthcare) Does patient have an advance directive?: No Would patient like information on creating an advanced directive?: No - patient declined information    Additional Information 1:1 In Past 12 Months?: No CIRT Risk: No Elopement Risk: No Does patient have medical clearance?: No     Disposition: Consulted with Hulan Fess, NP who said Pt does not meet criteria for inpatient treatment and recommends Pt follow up with current outpatient provider. Explained to Pt that he doesn't meet criteria and that inpatient crisis stabilization is no necessary at this time.  Recommended to Pt that he work with [employer] staff on securing admission to a half-way house or other residential treatment. Pt agrees to follow up with [facility]. Pt refused to sign MSE Refusal Form.  Disposition Initial Assessment Completed for this Encounter: Yes Disposition of Patient: Outpatient treatment Type of outpatient treatment: Adult   Pamalee Leyden, [facility], [facility], [facility] Triage Specialist [phone]   Pamalee Leyden 11/15/2015 3:04 AM

## 2018-06-13 ENCOUNTER — Other Ambulatory Visit: Payer: Self-pay

## 2018-06-13 ENCOUNTER — Encounter (HOSPITAL_COMMUNITY): Payer: Self-pay

## 2018-06-13 ENCOUNTER — Emergency Department (HOSPITAL_COMMUNITY): Admission: EM | Admit: 2018-06-13 | Discharge: 2018-06-13 | Discharge status: Against Medical Advice | Payer: Self-pay

## 2018-06-13 ENCOUNTER — Emergency Department (HOSPITAL_COMMUNITY)
Admission: EM | Admit: 2018-06-13 | Discharge: 2018-06-14 | Disposition: A | Discharge status: Home / Self Care | Payer: Self-pay | Attending: Emergency Medicine | Admitting: Emergency Medicine

## 2018-06-13 DIAGNOSIS — F259 Schizoaffective disorder, unspecified: Secondary | ICD-10-CM

## 2018-06-13 DIAGNOSIS — J45909 Unspecified asthma, uncomplicated: Secondary | ICD-10-CM | POA: Insufficient documentation

## 2018-06-13 DIAGNOSIS — F250 Schizoaffective disorder, bipolar type: Secondary | ICD-10-CM | POA: Diagnosis present

## 2018-06-13 DIAGNOSIS — Z79899 Other long term (current) drug therapy: Secondary | ICD-10-CM | POA: Insufficient documentation

## 2018-06-13 DIAGNOSIS — F17210 Nicotine dependence, cigarettes, uncomplicated: Secondary | ICD-10-CM | POA: Insufficient documentation

## 2018-06-13 DIAGNOSIS — F29 Unspecified psychosis not due to a substance or known physiological condition: Secondary | ICD-10-CM | POA: Insufficient documentation

## 2018-06-13 LAB — COMPREHENSIVE METABOLIC PANEL
ALT: 48 U/L — ABNORMAL HIGH (ref 0–44)
AST: 107 U/L — ABNORMAL HIGH (ref 15–41)
Albumin: 4.9 g/dL (ref 3.5–5.0)
Alkaline Phosphatase: 85 U/L (ref 38–126)
Anion gap: 11 (ref 5–15)
BUN: 29 mg/dL — ABNORMAL HIGH (ref 6–20)
CHLORIDE: 101 mmol/L (ref 98–111)
CO2: 25 mmol/L (ref 22–32)
Calcium: 10.1 mg/dL (ref 8.9–10.3)
Creatinine, Ser: 1.73 mg/dL — ABNORMAL HIGH (ref 0.61–1.24)
GFR, EST NON AFRICAN AMERICAN: 52 mL/min — AB (ref >60)
Glucose, Bld: 140 mg/dL — ABNORMAL HIGH (ref 70–99)
POTASSIUM: 3.6 mmol/L (ref 3.5–5.1)
Sodium: 137 mmol/L (ref 135–145)
Total Bilirubin: 0.9 mg/dL (ref 0.3–1.2)
Total Protein: 8.7 g/dL — ABNORMAL HIGH (ref 6.5–8.1)

## 2018-06-13 LAB — CBC
HCT: 42.5 % (ref 39.0–52.0)
Hemoglobin: 14.5 g/dL (ref 13.0–17.0)
MCH: 29.5 pg (ref 26.0–34.0)
MCHC: 34.1 g/dL (ref 30.0–36.0)
MCV: 86.4 fL (ref 78.0–100.0)
PLATELETS: 346 10*3/uL (ref 150–400)
RBC: 4.92 MIL/uL (ref 4.22–5.81)
RDW: 12.5 % (ref 11.5–15.5)
WBC: 11.1 10*3/uL — AB (ref 4.0–10.5)

## 2018-06-13 LAB — SALICYLATE LEVEL: Salicylate Lvl: <7 mg/dL (ref 2.8–30.0)

## 2018-06-13 LAB — ACETAMINOPHEN LEVEL

## 2018-06-13 LAB — ETHANOL

## 2018-06-13 MED ORDER — HYDROXYZINE HCL 25 MG PO TABS: 25.0000 mg | ORAL_TABLET | Freq: Three times a day (TID) | ORAL | Status: DC

## 2018-06-13 MED ORDER — LORAZEPAM 1 MG PO TABS: 2.0000 mg | ORAL_TABLET | Freq: Once | ORAL | Status: DC | PRN

## 2018-06-13 MED ORDER — HYDROXYZINE HCL 25 MG PO TABS: 25.0000 mg | ORAL_TABLET | Freq: Three times a day (TID) | ORAL | Status: DC | PRN

## 2018-06-13 MED ORDER — STERILE WATER FOR INJECTION IJ SOLN
INTRAMUSCULAR | Status: AC
  Administered 2018-06-13: 1.2 mL
  Filled 2018-06-13: qty 10

## 2018-06-13 MED ORDER — HALOPERIDOL LACTATE 5 MG/ML IJ SOLN: 10.0000 mg | Freq: Once | INTRAMUSCULAR | Status: DC

## 2018-06-13 MED ORDER — OLANZAPINE 5 MG PO TABS: 5.0000 mg | ORAL_TABLET | Freq: Every day | ORAL | Status: DC

## 2018-06-13 MED ORDER — LORAZEPAM 2 MG/ML IJ SOLN
2.0000 mg | Freq: Once | INTRAMUSCULAR | Status: AC
  Administered 2018-06-13: 2 mg via INTRAMUSCULAR
  Filled 2018-06-13: qty 1

## 2018-06-13 MED ORDER — ZIPRASIDONE MESYLATE 20 MG IM SOLR
10.0000 mg | Freq: Once | INTRAMUSCULAR | Status: AC
  Administered 2018-06-13: 10 mg via INTRAMUSCULAR
  Filled 2018-06-13: qty 20

## 2018-06-13 MED ORDER — ZIPRASIDONE HCL 20 MG PO CAPS: 60.0000 mg | ORAL_CAPSULE | Freq: Once | ORAL | Status: DC | PRN

## 2018-06-13 MED ORDER — DIPHENHYDRAMINE HCL 25 MG PO CAPS: 50.0000 mg | ORAL_CAPSULE | Freq: Once | ORAL | Status: DC | PRN

## 2018-06-13 MED ORDER — DIPHENHYDRAMINE HCL 50 MG/ML IJ SOLN
50.0000 mg | Freq: Once | INTRAMUSCULAR | Status: AC
  Administered 2018-06-13: 50 mg via INTRAMUSCULAR
  Filled 2018-06-13: qty 1

## 2018-06-13 MED ORDER — TRAZODONE HCL 100 MG PO TABS: 100.0000 mg | ORAL_TABLET | Freq: Every day | ORAL | Status: DC

## 2018-06-13 NOTE — ED Notes (Addendum)
Pt ambulatory from [facility] with [employer], talkative, pressurred apeech, reports that he has problems with anger.  Pt reports that he has been off of his medications for unknown period of time and needs to get back on them becuase they keep him calm.  Pt changed into scrubs and wanded after arrival to unit.

## 2018-06-13 NOTE — ED Notes (Signed)
Patient has checked in and left the ED x4  When he gets called back and keeps checking in again but won't go back to be seen.

## 2018-06-13 NOTE — ED Notes (Addendum)
Pt alert/oriented, pressured and rambling speech reports th at he has AVH.  Pt reports that he sees demons that tell him to hurt others.  Pt reports that he hasn't slept in 6 days and has been taking care of his mother who has had 2 strokes, for the past 6 months.  , shaking.  Pt denies si at this time.--note assessed at 1724 not at 1624

## 2018-06-13 NOTE — ED Notes (Addendum)
Dr Adriana Simascook into see, is aware of BP

## 2018-06-13 NOTE — ED Notes (Signed)
Up in the hall walking. 

## 2018-06-13 NOTE — ED Notes (Signed)
PT talkative, animated, pressurred speech rapid. Talking about his mother and their difficulties.

## 2018-06-13 NOTE — ED Notes (Signed)
Resting quielty, nad, much calmer, trying to sleep.

## 2018-06-13 NOTE — BH Assessment (Addendum)
Assessment Note  Philip BandRodney D [name] is an 27 y.o. male, who [facility]. Pt was a poor historian during the assessment. Clinician asked the pt, "what brought you to the hospital?" Pt reported, "not sure." Pt reported, not sleeping in days. Pt reported, "I feel like my body is about to shut down." Pt denies, SI, HI, AVH, self-injurious behaviors and access to weapons.   Per RN note: "Pt alert/oriented, pressured and rambling speech reports th at he has AVH.  Pt reports that he sees demons that tell him to hurt others.  Pt reports that he hasn't slept in 6 days and has been taking care of his mother who has had 2 strokes, for the past 6 months."   Clinician was unable to assess the following: living situation, OPT resources, stressors, family support, appetite, symptoms of depression, previous inpatient admissions, history of violence, history of abuse, contract for safety, vegetative symptoms. Pt's UDS is pending.   Pt presents sleeping in scrubs with soft speech. Pt's eye contact was poor. Pt's mood was pleasant. Pt's thought process was circumstantial. Pt's judgement was impaired. Pt was oriented x1. Pt's concentration and impulse control are fair. Pt's insight was poor.   Diagnosis: Schizophrenia.  Past Medical History:  Past Medical History:  Diagnosis Date  . ADHD (attention deficit hyperactivity disorder)   . Asthma   . Bipolar affective disorder (HCC)   . Schizophrenia [facility](HCC)     Past Surgical History:  Procedure Laterality Date  . NO PAST SURGERIES      Family History:  Family History  Problem Relation Age of Onset  . Hypertension Mother     Social History:  reports that he has been smoking cigarettes. He has a 6.00 pack-year smoking history. He does not have any smokeless tobacco history on file. He reports that he drinks about 1.0 standard drinks of alcohol per week. He reports that he has current or past drug history. Drug:  Marijuana.  Additional Social History:  Alcohol / Drug Use Pain Medications: See MAR Prescriptions: See MAR Over the Counter: See MAR History of alcohol / drug use?: (UDS is pending. )  CIWA: CIWA-Ar BP: (!) 160/92 Pulse Rate: (!) 114 COWS:    Allergies:  Allergies  Allergen Reactions  . Shrimp [Shellfish Allergy] Anaphylaxis    Home Medications:  (Not in a hospital admission)  OB/GYN Status:  No LMP for male patient.  General Assessment Data Location of Assessment: [facility] TTS Assessment: In system Is this a Tele or Face-to-Face Assessment?: Face-to-Face Is this an Initial Assessment or a Re-assessment for this encounter?: Initial Assessment Marital status: Single Living Arrangements: Other (Comment)(UTA) Can pt return to current living arrangement?: (UTA) Admission Status: Voluntary Is patient capable of signing voluntary admission?: Yes Referral Source: Self/Family/Friend Insurance type: Self-pay.      Crisis Care Plan Living Arrangements: Other (Comment)(UTA) Legal Guardian: Other:(Self. ) Name of Psychiatrist: UTA Name of Therapist: UTA  Education Status Is patient currently in school?: No Is the patient employed, unemployed or receiving disability?: (UTA)  Risk to self with the past 6 months Suicidal Ideation: No(Pt denies. ) Has patient been a risk to self within the past 6 months prior to admission? : No Suicidal Intent: No Has patient had any suicidal intent within the past 6 months prior to admission? : No Is patient at risk for suicide?: No Suicidal Plan?: No Has patient had any suicidal plan within the past 6 months prior to admission? : No  Access to Means: No What has been your use of drugs/alcohol within the last 12 months?: UDS is pending.  Previous Attempts/Gestures: No How many times?: 0 Other Self Harm Risks: Pt denies.  Triggers for Past Attempts: None known Intentional Self Injurious Behavior: None Family Suicide History: Unable to  assess Recent stressful life event(s): Other (Comment)(Not sleeping. ) Persecutory voices/beliefs?: Yes(Per chart.) Depression: (UTA) Depression Symptoms: (UTA) Substance abuse history and/or treatment for substance abuse?: (UTA) Suicide prevention information given to non-admitted patients: Not applicable  Risk to Others within the past 6 months Homicidal Ideation: No(Pt denies. ) Does patient have any lifetime risk of violence toward others beyond the six months prior to admission? : (UTA) Thoughts of Harm to Others: No(Pt denies. ) Current Homicidal Intent: No Current Homicidal Plan: No Access to Homicidal Means: No Identified Victim: NA History of harm to others?: (UTA) Assessment of Violence: (UTA) Violent Behavior Description: UTA Does patient have access to weapons?: No(Pt denies. ) Criminal Charges Pending?: No Does patient have a court date: No Is patient on probation?: No  Psychosis Hallucinations: Auditory(Per chart, however pt denies. ) Delusions: Unspecified(Per chart, however pt denies. )  Mental Status Report Appearance/Hygiene: In scrubs Eye Contact: Poor Motor Activity: Restlessness Speech: Logical/coherent Level of Consciousness: Sleeping Affect: Flat Anxiety Level: None Thought Processes: Circumstantial Judgement: Impaired Orientation: Person Obsessive Compulsive Thoughts/Behaviors: None  Cognitive Functioning Concentration: Fair Memory: Recent Impaired Is patient IDD: No Is patient DD?: No Insight: Poor Impulse Control: Fair Appetite: (UTA) Sleep: Unable to Assess Vegetative Symptoms: Unable to Assess  ADLScreening [facility] Assessment Services) Patient's cognitive ability adequate to safely complete daily activities?: Yes Patient able to express need for assistance with ADLs?: Yes Independently performs ADLs?: Yes (appropriate for developmental age)  Prior Inpatient Therapy Prior Inpatient Therapy: (UTA)  Prior Outpatient Therapy Prior  Outpatient Therapy: (UTA)  ADL Screening (condition at time of admission) Patient's cognitive ability adequate to safely complete daily activities?: Yes Is the patient deaf or have difficulty hearing?: No Does the patient have difficulty seeing, even when wearing glasses/contacts?: (UTA) Does the patient have difficulty concentrating, remembering, or making decisions?: Yes Patient able to express need for assistance with ADLs?: Yes Does the patient have difficulty dressing or bathing?: No Independently performs ADLs?: Yes (appropriate for developmental age) Does the patient have difficulty walking or climbing stairs?: No Weakness of Legs: None Weakness of Arms/Hands: None       Abuse/Neglect Assessment (Assessment to be complete while patient is alone) Abuse/Neglect Assessment Can Be Completed: Unable to assess, patient is non-responsive or altered mental status     Advance Directives (For Healthcare) Does Patient Have a Medical Advance Directive?: (UTA)          Disposition: Elta GuadeloupeLaurie Parks, NP recommends inpatient treatment. disposition discussed with Dr. Adriana Simasook and Joanie CoddingtonLatricia, RN. TTS to seek placement.    Disposition Initial Assessment Completed for this Encounter: Yes  On Site Evaluation by: Redmond Pullingreylese D Darwin Guastella, MS, LPC, CRC. Reviewed with Physician:  Dr. Adriana Simasook and Elta GuadeloupeLaurie Parks, NP.  Redmond Pullingreylese D Demarqus Jocson 06/13/2018 7:21 PM

## 2018-06-13 NOTE — ED Triage Notes (Signed)
Pt voluntary to ED to " go back on psych meds"  Patient states he quit his meds cold [place] and hasnt sleep in long time. Pt has flight of thoughts, paranoia, very suspicious of everyone, says he " feels oppressed" . Sts his mother had 2 strokes and he is overwhelmed at home. Patient balls up fists and raises voice when angry.  [employer] and [facility] had to intervene because patient was showing aggression towards people in waiting room. Pt thought someone was " looking at him funny"

## 2018-06-13 NOTE — ED Notes (Signed)
Pt talkative, polite, cooperative and redirectable, PO fluids encouraged.

## 2018-06-13 NOTE — ED Notes (Signed)
Pt sleeping at present, no distress noted, calm & cooperative at present.  Monitoring for safety, Q 15 min checks in effect. 

## 2018-06-13 NOTE — ED Notes (Signed)
On the phone 

## 2018-06-13 NOTE — ED Notes (Addendum)
Pt eating, Toniann FailWendy into draw blood

## 2018-06-13 NOTE — ED Notes (Signed)
Pt yelling and threatening pts and their family members in the lobby, security called and pt went out of the lobby

## 2018-06-13 NOTE — ED Notes (Signed)
Up talking with [employer] officer in hall

## 2018-06-13 NOTE — ED Notes (Signed)
Dr Adriana Simasook updated with BP

## 2018-06-13 NOTE — ED Notes (Signed)
Bed: WA30 Expected date:  Expected time:  Means of arrival:  Comments: 

## 2018-06-13 NOTE — ED Notes (Signed)
Pt was called to be triaged and treated.  Pt was found outside and stated he wasn't ready to come in and be treated because he "wanted to smoke a cigarette" Pt states that he is voluntary and will come in to be seen when he's ready.

## 2018-06-13 NOTE — ED Notes (Signed)
Pt verbally aggressive with Security and [employer] and another resident in dept.  PA Allyson SabalBerry called for orders.

## 2018-06-14 DIAGNOSIS — F250 Schizoaffective disorder, bipolar type: Secondary | ICD-10-CM

## 2018-06-14 LAB — RAPID URINE DRUG SCREEN, HOSP PERFORMED
Amphetamines: NOT DETECTED
BENZODIAZEPINES: NOT DETECTED
Barbiturates: NOT DETECTED
COCAINE: NOT DETECTED
Tetrahydrocannabinol: POSITIVE — AB

## 2018-06-14 NOTE — ED Provider Notes (Signed)
[facility]-EMERGENCY DEPT Provider Note   CSN: [id number] Arrival date & time: 06/13/18  1617     History   Chief Complaint Chief Complaint  Patient presents with  . Psychiatric Evaluation    pt from home, said S/I, then said just needs to restart meds " lactulose,codiene promethazine" to help him sleep.  Flight of thoughts, paranoia, aggression in the waiting room    HPI Philip [name] is a 27 y.o. male.  Level 5 caveat for psychiatric illness.  Patient has a known history of schizoaffective disorder, bipolar 1 disorder with mania, polysubstance abuse, several others.  He states "I see demons that make me want to hurt others".  He is paranoid and has been aggressive with Visual merchandisersecurity staff.  Flight of ideas and rambling speech noted.  No sleep in several days.     Past Medical History:  Diagnosis Date  . ADHD (attention deficit hyperactivity disorder)   . Asthma   . Bipolar affective disorder (HCC)   . Schizophrenia [facility](HCC)     Patient Active Problem List   Diagnosis Date Noted  . Schizoaffective disorder (HCC) 11/09/2015  . Mood disorder (HCC)   . 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 Bipolar I disorder with mania (HCC)   . Agitation 09/16/2014  . Polysubstance abuse (HCC) 09/16/2014  . Substance induced mood disorder (HCC) 09/16/2014  . Cannabis use disorder, severe, dependence (HCC)   . Hyperammonemia (HCC) 09/07/2014  . Schizoaffective disorder, bipolar type (HCC)   . 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 NO PAST SURGERIES          Home Medications    Prior to Admission medications   Medication Sig Start Date End Date Taking? Authorizing Provider  albuterol (PROVENTIL HFA;VENTOLIN HFA) 108 (90 BASE) MCG/ACT inhaler Inhale 2 puffs into the lungs every 6 (six) hours as needed for wheezing or shortness of breath.    [provider]    amphetamine-dextroamphetamine (ADDERALL) 5 MG tablet Take 5 mg by mouth 2 (two) times daily with a meal.    [provider]  benztropine (COGENTIN) 1 MG tablet Take 1 tablet (1 mg total) by mouth 2 (two) times daily. 11/10/15   Withrow, Everardo AllJohn C, FNP  clonazePAM (KLONOPIN) 0.5 MG tablet Take 0.5 mg by mouth 3 (three) times daily as needed for anxiety.    [provider]  hydrOXYzine (ATARAX/VISTARIL) 25 MG tablet Take 1 tablet (25 mg total) by mouth every 6 (six) hours as needed for anxiety. 11/10/15   Withrow, Everardo AllJohn C, FNP  OLANZapine zydis (ZYPREXA) 15 MG disintegrating tablet Take 1 tablet (15 mg total) by mouth 2 (two) times daily after a meal. 11/10/15   Withrow, Everardo AllJohn C, FNP  OXcarbazepine (TRILEPTAL) 600 MG tablet Take 1 tablet (600 mg total) by mouth 2 (two) times daily. 11/10/15   Withrow, Everardo AllJohn C, FNP  oxyCODONE-acetaminophen (PERCOCET/ROXICET) 5-325 MG tablet Take 1 tablet by mouth every 4 (four) hours as needed for severe pain.    [provider]  promethazine (PHENERGAN) 25 MG tablet Take 25 mg by mouth every 6 (six) hours as needed for nausea or vomiting.    [provider]  traZODone (DESYREL) 100 MG tablet Take 1 tablet (100 mg total) by mouth at bedtime as needed (agitation). 11/10/15   Withrow, Everardo AllJohn C, FNP    Family History Family History  Problem Relation Age of Onset  .  Hypertension Mother     Social History Social History   Tobacco Use  . Smoking status: Current Every Day Smoker    Packs/day: 1.00    Years: 6.00    Pack years: 6.00    Types: Cigarettes  . Tobacco comment: unable to assess  Substance Use Topics  . Alcohol use: Yes    Alcohol/week: 1.0 standard drinks    Types: 1 Cans of beer per week    Comment: unable to assess  . Drug use: Yes    Types: Marijuana    Comment: unable to assess     Allergies   Shrimp [shellfish allergy]   Review of Systems Review of Systems  Unable to perform ROS: Psychiatric disorder      Physical Exam Updated Vital Signs BP (!) 160/92 (BP Location: Right Arm)   Pulse (!) 114   Temp 98.3 F (36.8 C) (Oral)   Resp 16   SpO2 97%   Physical Exam  Constitutional: He is oriented to person, place, and time.  Ambulatory  HENT:  Head: Normocephalic and atraumatic.  Eyes: Conjunctivae are normal.  Neck: Neck supple.  Cardiovascular: Normal rate and regular rhythm.  Pulmonary/Chest: Effort normal and breath sounds normal.  Abdominal: Soft. Bowel sounds are normal.  Musculoskeletal: Normal range of motion.  Neurological: He is alert and oriented to person, place, and time.  Skin: Skin is warm and dry.  Psychiatric:  Flight of ideas, pressured speech, tangential thinking, paranoid, manic  Nursing note and vitals reviewed.    ED Treatments / Results  Labs (all labs ordered are listed, but only abnormal results are displayed) Labs Reviewed  COMPREHENSIVE METABOLIC PANEL - Abnormal; Notable for the following components:      Result Value   Glucose, Bld 140 (*)    BUN 29 (*)    Creatinine, Ser 1.73 (*)    Total Protein 8.7 (*)    AST 107 (*)    ALT 48 (*)    GFR calc non Af Amer 52 (*)    All other components within normal limits  ACETAMINOPHEN LEVEL - Abnormal; Notable for the following components:   Acetaminophen (Tylenol), Serum <10 (*)    All other components within normal limits  CBC - Abnormal; Notable for the following components:   WBC 11.1 (*)    All other components within normal limits  RAPID URINE DRUG SCREEN, HOSP PERFORMED - Abnormal; Notable for the following components:   Tetrahydrocannabinol POSITIVE (*)    All other components within normal limits  ETHANOL  SALICYLATE LEVEL    EKG None  Radiology No results found.  Procedures Procedures (including critical care time)  Medications Ordered in ED Medications  ziprasidone (GEODON) capsule 60 mg (has no administration in time range)  LORazepam (ATIVAN) tablet 2 mg (has no  administration in time range)  diphenhydrAMINE (BENADRYL) capsule 50 mg (has no administration in time range)  traZODone (DESYREL) tablet 100 mg (100 mg Oral Refused 06/13/18 2252)  OLANZapine (ZYPREXA) tablet 5 mg (5 mg Oral Refused 06/13/18 2252)  hydrOXYzine (ATARAX/VISTARIL) tablet 25 mg (has no administration in time range)  haloperidol lactate (HALDOL) injection 10 mg (has no administration in time range)  ziprasidone (GEODON) injection 10 mg (10 mg Intramuscular Given 06/13/18 2334)    And  diphenhydrAMINE (BENADRYL) injection 50 mg (50 mg Intramuscular Given 06/13/18 2336)    And  LORazepam (ATIVAN) injection 2 mg (2 mg Intramuscular Given 06/13/18 2335)  sterile water (preservative free) injection (1.2 mLs  Given 06/13/18 2336)     Initial Impression / Assessment and Plan / ED Course  I have reviewed the triage vital signs and the nursing notes.  Pertinent labs & imaging results that were available during my care of the patient were reviewed by me and considered in my medical decision making (see chart for details).     Patient with a known history of multiple psychiatric disorders presents with tangential thinking, flight of ideas, paranoid behavior, evidence of psychosis.  Drug screen positive for THC.  He required Haldol 10 mg IM to reduce his agitation.  Involuntary commitment papers filled out.   CRITICAL CARE Performed by: Donnetta HutchingBrian Analycia Khokhar  ?  Total critical care time: 30 minutes  Critical care time was exclusive of separately billable procedures and treating other patients.  Critical care was necessary to treat or prevent imminent or life-threatening deterioration.  Critical care was time spent personally by me on the following activities: development of treatment plan with patient and/or surrogate as well as nursing, discussions with consultants, evaluation of patient's response to treatment, examination of patient, obtaining history from patient or surrogate, ordering and  performing treatments and interventions, ordering and review of laboratory studies, ordering and review of radiographic studies, pulse oximetry and re-evaluation of patient's condition.  Final Clinical Impressions(s) / ED Diagnoses   Final diagnoses:  Psychosis, unspecified psychosis type [facility](HCC)    ED Discharge Orders    None       Donnetta Hutchingook, Renne Cornick, MD 06/14/18 0025

## 2018-06-14 NOTE — ED Notes (Signed)
Pt d/c home per MD order. Discharge summary reviewed with pt. Pt verbalizes understanding. Denies SI/HI/AVH. Personal property returned. Pt signed e-signature. Ambulatory off unit with MHT.  

## 2018-06-14 NOTE — BH Assessment (Signed)
[facility]  Per Juanetta BeetsJacqueline Norman, DO, this pt does not require psychiatric hospitalization at this time.  Pt presents under IVC initiated by EDP Donnetta HutchingBrian Cook, MD, which Dr Sharma CovertNorman has rescinded.  [facility]  This has been included in pt's discharge instructions.  Pt's nurse, Diane, has been notified.  Doylene Canninghomas Shanan Fitzpatrick, MA Triage Specialist [phone]

## 2018-06-14 NOTE — ED Notes (Signed)
Reports feeling better and slept well last night. Denies SI/HI/AVH. Behavior appropriate with high energy, talkative and is friendly. States that he is ready for discharge and [facility]

## 2018-06-14 NOTE — Consult Note (Addendum)
[facility] ED Discharge  06/14/2018 11:10 AM Philip [name]  MRN:  [id number] Principal Problem: Schizoaffective disorder, bipolar type [facility]) Discharge Diagnoses:  Patient Active Problem List   Diagnosis Date Noted  . Schizoaffective disorder (HCC) [F25.9] 11/09/2015  . Mood disorder (HCC) [F39]   . Adjustment disorder with mixed disturbance of emotions and conduct [F43.25] 11/05/2015  . Bipolar I disorder with mania (HCC) [F31.10]   . Agitation [R45.1] 09/16/2014  . Polysubstance abuse (HCC) [F19.10] 09/16/2014  . Substance induced mood disorder (HCC) [F19.94] 09/16/2014  . Cannabis use disorder, severe, dependence (HCC) [F12.20]   . Hyperammonemia (HCC) [E72.20] 09/07/2014  . Schizoaffective disorder, bipolar type (HCC) [F25.0]   . Psychotic disorder (HCC) [F29] 09/01/2014    Subjective: Pt was seen and chart reviewed with treatment team and Dr Sharma Covert. Pt denies suicidal/homicidal ideation, denies auditory/visual hallucinations and does not appear to be responding to internal stimuli. Pt stated he lives with his mother and she has had two strokes in the past 6 months. Pt stated he was living in [place] and came here to take care of his mother. Pt's UDS positive for THC, BAL negative. Pt goes to [facility] for his medication management and stated he takes all of his medications as prescribed. Pt's labs and diagnostics were reviewed and are WNL. Pt is able to contract for safety upon discharge. Pt is somewhat tangential in his thought process but is pleasant, cooperative and easily redirectable. Pt is polite and is seen in the milieu interacting appropriately with staff and peers. Pt is stable and psychiatrically clear for discharge.   Total Time spent with patient: 45 minutes  Past Psychiatric History: As above  Past Medical History:  Past Medical History:  Diagnosis Date  . ADHD (attention deficit hyperactivity disorder)   . Asthma   . Bipolar affective disorder (HCC)   .  Schizophrenia [facility])    Past Surgical History:  Procedure Laterality Date  . NO PAST SURGERIES     Family History:  Family History  Problem Relation Age of Onset  . Hypertension Mother    Family Psychiatric  History: Unknown Social History:  Social History   Substance and Sexual Activity  Alcohol Use Yes  . Alcohol/week: 1.0 standard drinks  . Types: 1 Cans of beer per week   Comment: unable to assess    Social History   Substance and Sexual Activity  Drug Use Yes  . Types: Marijuana   Comment: unable to assess   Social History   Socioeconomic History  . Marital status: Married    Spouse name: Not on file  . Number of children: Not on file  . Years of education: Not on file  . Highest education level: Not on file  Occupational History  . Not on file  Social Needs  . Financial resource strain: Not on file  . Food insecurity:    Worry: Not on file    Inability: Not on file  . Transportation needs:    Medical: Not on file    Non-medical: Not on file  Tobacco Use  . Smoking status: Current Every Day Smoker    Packs/day: 1.00    Years: 6.00    Pack years: 6.00    Types: Cigarettes  . Tobacco comment: unable to assess  Substance and Sexual Activity  . Alcohol use: Yes    Alcohol/week: 1.0 standard drinks    Types: 1 Cans of beer per week    Comment: unable to assess  .  Drug use: Yes    Types: Marijuana    Comment: unable to assess  . Sexual activity: Yes    Comment: unable to access  Lifestyle  . Physical activity:    Days per week: Not on file    Minutes per session: Not on file  . Stress: Not on file  Relationships  . Social connections:    Talks on phone: Not on file    Gets together: Not on file    Attends religious service: Not on file    Active member of club or organization: Not on file    Attends meetings of clubs or organizations: Not on file    Relationship status: Not on file  Other Topics Concern  . Not on file  Social History Narrative   . Not on file    Has this patient used any form of tobacco in the last 30 days? (Cigarettes, Smokeless Tobacco, Cigars, and/or Pipes) Prescription not provided because: Pt does not use nicotine  Current Medications: Current Facility-Administered Medications  Medication Dose Route Frequency Provider Last Rate Last Dose  . diphenhydrAMINE (BENADRYL) capsule 50 mg  50 mg Oral Once PRN Laveda AbbeParks, Laurie Britton, NP      . haloperidol lactate (HALDOL) injection 10 mg  10 mg Intramuscular Once Donnetta Hutchingook, Brian, MD      . hydrOXYzine (ATARAX/VISTARIL) tablet 25 mg  25 mg Oral TID PRN Donnetta Hutchingook, Brian, MD      . LORazepam (ATIVAN) tablet 2 mg  2 mg Oral Once PRN Laveda AbbeParks, Laurie Britton, NP      . OLANZapine [facility](ZYPREXA) tablet 5 mg  5 mg Oral QHS Laveda AbbeParks, Laurie Britton, NP      . traZODone (DESYREL) tablet 100 mg  100 mg Oral QHS Laveda AbbeParks, Laurie Britton, NP      . ziprasidone (GEODON) capsule 60 mg  60 mg Oral Once PRN Laveda AbbeParks, Laurie Britton, NP       Current Outpatient Medications  Medication Sig Dispense Refill  . amitriptyline (ELAVIL) 25 MG tablet Take 25 mg by mouth at bedtime.    Marland Kitchen. amphetamine-dextroamphetamine (ADDERALL) 5 MG tablet Take 5 mg by mouth 2 (two) times daily with a meal.    . benztropine (COGENTIN) 1 MG tablet Take 1 tablet (1 mg total) by mouth 2 (two) times daily. (Patient not taking: Reported on 06/14/2018) 28 tablet 0  . hydrOXYzine (ATARAX/VISTARIL) 25 MG tablet Take 1 tablet (25 mg total) by mouth every 6 (six) hours as needed for anxiety. (Patient not taking: Reported on 06/14/2018) 30 tablet 0  . OLANZapine zydis (ZYPREXA) 15 MG disintegrating tablet Take 1 tablet (15 mg total) by mouth 2 (two) times daily after a meal. (Patient not taking: Reported on 06/14/2018) 28 tablet 0  . Oxcarbazepine (TRILEPTAL) 300 MG tablet Take 300 mg by mouth 2 (two) times daily.    . promethazine-codeine (PHENERGAN WITH CODEINE) 6.25-10 MG/5ML syrup Take 5 mLs by mouth every 6 (six) hours as needed for cough.    .  traZODone (DESYREL) 100 MG tablet Take 1 tablet (100 mg total) by mouth at bedtime as needed (agitation). (Patient not taking: Reported on 06/14/2018) 14 tablet 0    Musculoskeletal: Strength & Muscle Tone: within normal limits Gait & Station: normal Patient leans: N/A  Psychiatric Specialty Exam: Physical Exam  Nursing note and vitals reviewed. Constitutional: He is oriented to person, place, and time. He appears well-developed and well-nourished.  HENT:  Head: Normocephalic and atraumatic.  Neck: Normal range of motion.  Respiratory: Effort normal.  Musculoskeletal: Normal range of motion.  Neurological: He is alert and oriented to person, place, and time.  Psychiatric: His behavior is normal. Thought content normal. His mood appears anxious. His speech is rapid and/or pressured. Cognition and memory are normal. He expresses impulsivity.    Review of Systems  Psychiatric/Behavioral: Negative for depression, hallucinations, memory loss, substance abuse and suicidal ideas. The patient is nervous/anxious. The patient does not have insomnia.   All other systems reviewed and are negative.   Blood pressure (!) 160/92, pulse (!) 114, temperature 98.3 F (36.8 C), temperature source Oral, resp. rate 16, SpO2 97 %.There is no height or weight on file to calculate BMI.  General Appearance: Casual  Eye Contact:  Good  Speech:  Clear and Coherent and Pressured  Volume:  Normal  Mood:  Euthymic  Affect:  Congruent  Thought Process:  Coherent, Goal Directed, Linear and Descriptions of Associations: Intact  Orientation:  Full (Time, Place, and Person)  Thought Content:  Tangential  Suicidal Thoughts:  No  Homicidal Thoughts:  No  Memory:  Immediate;   Good Recent;   Good Remote;   Fair  Judgement:  Fair  Insight:  Fair  Psychomotor Activity:  Increased  Concentration:  Concentration: Good and Attention Span: Good  Recall:  Good  Fund of Knowledge:  Good  Language:  Good  Akathisia:   No  Handed:  Right  AIMS (if indicated):   N/A  Assets:  ArchitectCommunication Skills Financial Resources/Insurance Housing  ADL's:  Intact  Cognition:  WNL  Sleep:   Good     Demographic Factors:  Male, Adolescent or young adult, Low socioeconomic status and Unemployed  Loss Factors: Financial problems/change in socioeconomic status  Historical Factors: Impulsivity  Risk Reduction Factors:   Living with another person, especially a relative  Continued Clinical Symptoms:  Bipolar Disorder:   Mixed State Depression:   Impulsivity  Cognitive Features That Contribute To Risk:  Closed-mindedness    Suicide Risk:  Minimal: No identifiable suicidal ideation.  Patients presenting with no risk factors but with morbid ruminations; may be classified as minimal risk based on the severity of the depressive symptoms   Plan Of Care/Follow-up recommendations:  Activity:  as tolerated Diet:  heart healthy  Disposition: Discharge home Take all medications as prescribed. Keep all follow-up appointments as scheduled.  Do not consume alcohol or use illegal drugs while on prescription medications. Report any adverse effects from your medications to your primary care provider promptly.  In the event of recurrent symptoms or worsening symptoms, call 911, a crisis hotline, or go to the nearest emergency department for evaluation.   Laveda AbbeLaurie Britton Parks, NP 06/14/2018, 11:10 AM   Patient seen face-to-face for psychiatric evaluation, chart reviewed and case discussed with the physician extender and developed treatment plan. Reviewed the information documented and agree with the treatment plan.  Juanetta BeetsJacqueline Havanah Nelms, DO 06/14/18 5:01 PM

## 2018-06-14 NOTE — ED Notes (Signed)
Pt talking on hallway phone.  

## 2018-06-14 NOTE — Discharge Instructions (Signed)
For your mental health needs, you are advised to continue treatment with [facility]: ° °     [facility] °     [address] °     [place], [place] [address] °     [phone]

## 2018-06-15 ENCOUNTER — Emergency Department (HOSPITAL_COMMUNITY)
Admission: EM | Admit: 2018-06-15 | Discharge: 2018-06-16 | Disposition: A | Discharge status: Home / Self Care | Payer: Self-pay | Attending: Emergency Medicine | Admitting: Emergency Medicine

## 2018-06-15 ENCOUNTER — Encounter (HOSPITAL_COMMUNITY): Payer: Self-pay

## 2018-06-15 DIAGNOSIS — J45909 Unspecified asthma, uncomplicated: Secondary | ICD-10-CM | POA: Insufficient documentation

## 2018-06-15 DIAGNOSIS — F3010 Manic episode without psychotic symptoms, unspecified: Secondary | ICD-10-CM

## 2018-06-15 DIAGNOSIS — Z046 Encounter for general psychiatric examination, requested by authority: Secondary | ICD-10-CM | POA: Insufficient documentation

## 2018-06-15 DIAGNOSIS — R451 Restlessness and agitation: Secondary | ICD-10-CM

## 2018-06-15 DIAGNOSIS — F17210 Nicotine dependence, cigarettes, uncomplicated: Secondary | ICD-10-CM | POA: Insufficient documentation

## 2018-06-15 DIAGNOSIS — R45851 Suicidal ideations: Secondary | ICD-10-CM | POA: Insufficient documentation

## 2018-06-15 DIAGNOSIS — F12122 Cannabis abuse with intoxication with perceptual disturbance: Secondary | ICD-10-CM

## 2018-06-15 DIAGNOSIS — Z79899 Other long term (current) drug therapy: Secondary | ICD-10-CM | POA: Insufficient documentation

## 2018-06-15 DIAGNOSIS — F250 Schizoaffective disorder, bipolar type: Secondary | ICD-10-CM | POA: Insufficient documentation

## 2018-06-15 LAB — COMPREHENSIVE METABOLIC PANEL
ALK PHOS: 87 U/L (ref 38–126)
ALT: 69 U/L — ABNORMAL HIGH (ref 0–44)
AST: 119 U/L — ABNORMAL HIGH (ref 15–41)
Albumin: 4.5 g/dL (ref 3.5–5.0)
Anion gap: 13 (ref 5–15)
BUN: 32 mg/dL — ABNORMAL HIGH (ref 6–20)
CALCIUM: 9.4 mg/dL (ref 8.9–10.3)
CO2: 23 mmol/L (ref 22–32)
Chloride: 103 mmol/L (ref 98–111)
Creatinine, Ser: 1.39 mg/dL — ABNORMAL HIGH (ref 0.61–1.24)
Glucose, Bld: 108 mg/dL — ABNORMAL HIGH (ref 70–99)
Potassium: 3.7 mmol/L (ref 3.5–5.1)
SODIUM: 139 mmol/L (ref 135–145)
Total Bilirubin: 1.2 mg/dL (ref 0.3–1.2)
Total Protein: 8.1 g/dL (ref 6.5–8.1)

## 2018-06-15 LAB — ETHANOL

## 2018-06-15 LAB — CBC
HCT: 39.5 % (ref 39.0–52.0)
HEMOGLOBIN: 13.2 g/dL (ref 13.0–17.0)
MCH: 28.9 pg (ref 26.0–34.0)
MCHC: 33.4 g/dL (ref 30.0–36.0)
MCV: 86.6 fL (ref 78.0–100.0)
PLATELETS: 320 10*3/uL (ref 150–400)
RBC: 4.56 MIL/uL (ref 4.22–5.81)
RDW: 12.2 % (ref 11.5–15.5)
WBC: 10.7 10*3/uL — ABNORMAL HIGH (ref 4.0–10.5)

## 2018-06-15 LAB — ACETAMINOPHEN LEVEL: Acetaminophen (Tylenol), Serum: <10 ug/mL — ABNORMAL LOW (ref 10–30)

## 2018-06-15 LAB — SALICYLATE LEVEL

## 2018-06-15 MED ORDER — ZIPRASIDONE MESYLATE 20 MG IM SOLR
20.0000 mg | Freq: Once | INTRAMUSCULAR | Status: AC
  Administered 2018-06-15: 20 mg via INTRAMUSCULAR

## 2018-06-15 MED ORDER — LORAZEPAM 2 MG/ML IJ SOLN
2.0000 mg | Freq: Once | INTRAMUSCULAR | Status: AC | PRN
  Administered 2018-06-15: 2 mg via INTRAMUSCULAR
  Filled 2018-06-15: qty 1

## 2018-06-15 MED ORDER — STERILE WATER FOR INJECTION IJ SOLN
INTRAMUSCULAR | Status: AC
  Administered 2018-06-15: 10 mL
  Filled 2018-06-15: qty 10

## 2018-06-15 MED ORDER — STERILE WATER FOR INJECTION IJ SOLN
INTRAMUSCULAR | Status: AC
  Administered 2018-06-15: 2.1 mL
  Filled 2018-06-15: qty 10

## 2018-06-15 MED ORDER — DIPHENHYDRAMINE HCL 50 MG/ML IJ SOLN
INTRAMUSCULAR | Status: AC
  Administered 2018-06-15: 50 mg via INTRAMUSCULAR
  Filled 2018-06-15: qty 1

## 2018-06-15 MED ORDER — LORAZEPAM 2 MG/ML IJ SOLN
2.0000 mg | Freq: Once | INTRAMUSCULAR | Status: AC
  Administered 2018-06-15: 2 mg via INTRAMUSCULAR

## 2018-06-15 MED ORDER — ZIPRASIDONE MESYLATE 20 MG IM SOLR
20.0000 mg | Freq: Once | INTRAMUSCULAR | Status: AC | PRN
  Administered 2018-06-15: 20 mg via INTRAMUSCULAR
  Filled 2018-06-15: qty 20

## 2018-06-15 MED ORDER — LITHIUM CARBONATE ER 300 MG PO TBCR
300.0000 mg | EXTENDED_RELEASE_TABLET | Freq: Two times a day (BID) | ORAL | Status: DC
  Administered 2018-06-15: 300 mg via ORAL
  Filled 2018-06-15 (×2): qty 1

## 2018-06-15 MED ORDER — ONDANSETRON HCL 4 MG PO TABS
4.0000 mg | ORAL_TABLET | Freq: Three times a day (TID) | ORAL | Status: DC | PRN
Start: 2018-06-15 — End: 2018-06-16
  Filled 2018-06-15: qty 1

## 2018-06-15 MED ORDER — ZIPRASIDONE MESYLATE 20 MG IM SOLR
INTRAMUSCULAR | Status: AC
  Administered 2018-06-15: 20 mg via INTRAMUSCULAR
  Filled 2018-06-15: qty 20

## 2018-06-15 MED ORDER — LORAZEPAM 2 MG/ML IJ SOLN
INTRAMUSCULAR | Status: AC
  Administered 2018-06-15: 2 mg via INTRAMUSCULAR
  Filled 2018-06-15: qty 1

## 2018-06-15 MED ORDER — BENZTROPINE MESYLATE 0.5 MG PO TABS
0.5000 mg | ORAL_TABLET | Freq: Two times a day (BID) | ORAL | Status: DC
  Administered 2018-06-15: 0.5 mg via ORAL
  Filled 2018-06-15 (×2): qty 1

## 2018-06-15 MED ORDER — DIPHENHYDRAMINE HCL 50 MG/ML IJ SOLN
50.0000 mg | Freq: Once | INTRAMUSCULAR | Status: AC | PRN
  Administered 2018-06-15: 50 mg via INTRAMUSCULAR
  Filled 2018-06-15: qty 1

## 2018-06-15 MED ORDER — DIPHENHYDRAMINE HCL 50 MG/ML IJ SOLN
50.0000 mg | Freq: Once | INTRAMUSCULAR | Status: AC
  Administered 2018-06-15: 50 mg via INTRAMUSCULAR

## 2018-06-15 NOTE — ED Notes (Signed)
Pt awake, refused VS. Pt reports the vital sign machine makes him angry.

## 2018-06-15 NOTE — ED Notes (Signed)
Bed: [facility] Expected date:  Expected time:  Means of arrival:  Comments: 

## 2018-06-15 NOTE — ED Notes (Signed)
SBAR Report received from previous nurse. Pt received pacing and labile on unit. Pt denies current SI/ HI, A/V H, depression, anxiety, or pain at this time, and appears otherwise stable and free of distress. Pt reminded of camera surveillance, q 15 min rounds, and rules of the milieu. Will continue to assess.

## 2018-06-15 NOTE — BH Assessment (Signed)
[facility] Assessment Progress Note Case was staffed with Arville CareParks FNP who recommended a inpatient admission to assist with stabalization as appropriate bed placement is investigated.

## 2018-06-15 NOTE — ED Triage Notes (Addendum)
Pt presents with [employer] under IVC papers. IVC papers reporting that pt is bi-polar and manic depressive. [facility]. Pt is rambling loudly in triage, word salad, not making a lot of sense. Pt denies SI/HI but when asked about suicidal thoughts, he says that he has had suicidal thoughts.

## 2018-06-15 NOTE — ED Provider Notes (Signed)
[name] Provider Note   CSN: [id number] Arrival date & time: 06/15/18  1213     History   Chief Complaint Chief Complaint  Patient presents with  . IVC  . Suicidal    HPI Philip [name] is a 27 y.o. male.  With a past medical history of bipolar, schizophrenia, polysubstance abuse, who presents today under IVC by [employer].  According to [employer] patient was in traffic dodging cars and blocking the flow of traffic.  Patient reports that he sees things others do not see, and he hears things others people do not hear.  He denies any physical complaints or concerns.    Remainder of HPI limited by psychiatric condition. HPI  Past Medical History:  Diagnosis Date  . ADHD (attention deficit hyperactivity disorder)   . Asthma   . Bipolar affective disorder (HCC)   . Schizophrenia [facility](HCC)     Patient Active Problem List   Diagnosis Date Noted  . Schizoaffective disorder (HCC) 11/09/2015  . Mood disorder (HCC)   . 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 Bipolar I disorder with mania (HCC)   . Agitation 09/16/2014  . Polysubstance abuse (HCC) 09/16/2014  . Substance induced mood disorder (HCC) 09/16/2014  . Cannabis use disorder, severe, dependence (HCC)   . Hyperammonemia (HCC) 09/07/2014  . Schizoaffective disorder, bipolar type (HCC)   . 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 NO PAST SURGERIES          Home Medications    Prior to Admission medications   Medication Sig Start Date End Date Taking? Authorizing Provider  amitriptyline (ELAVIL) 25 MG tablet Take 25 mg by mouth at bedtime.    [provider]  amphetamine-dextroamphetamine (ADDERALL) 5 MG tablet Take 5 mg by mouth 2 (two) times daily with a meal.    [provider]  benztropine (COGENTIN) 1 MG tablet Take 1 tablet (1 mg total) by mouth 2 (two) times daily. Patient not  taking: Reported on 06/14/2018 11/10/15   [name], [name], FNP  hydrOXYzine (ATARAX/VISTARIL) 25 MG tablet Take 1 tablet (25 mg total) by mouth every 6 (six) hours as needed for anxiety. Patient not taking: Reported on 06/14/2018 11/10/15   Withrow, Everardo AllJohn C, FNP  OLANZapine zydis (ZYPREXA) 15 MG disintegrating tablet Take 1 tablet (15 mg total) by mouth 2 (two) times daily after a meal. Patient not taking: Reported on 06/14/2018 11/10/15   Withrow, Everardo AllJohn C, FNP  Oxcarbazepine (TRILEPTAL) 300 MG tablet Take 300 mg by mouth 2 (two) times daily.    [provider]  promethazine-codeine (PHENERGAN WITH CODEINE) 6.25-10 MG/5ML syrup Take 5 mLs by mouth every 6 (six) hours as needed for cough.    [provider]  traZODone (DESYREL) 100 MG tablet Take 1 tablet (100 mg total) by mouth at bedtime as needed (agitation). Patient not taking: Reported on 06/14/2018 11/10/15   [name], [name], FNP    Family History Family History  Problem Relation Age of Onset  . Hypertension Mother     Social History Social History   Tobacco Use  . Smoking status: Current Every Day Smoker    Packs/day: 1.00    Years: 6.00    Pack years: 6.00    Types: Cigarettes  . Tobacco comment: unable to assess  Substance Use Topics  . Alcohol use: Yes    Alcohol/week: 1.0 standard drinks  Types: 1 Cans of beer per week    Comment: unable to assess  . Drug use: Yes    Types: Marijuana    Comment: unable to assess     Allergies   Shrimp [shellfish allergy]   Review of Systems Review of Systems  Unable to perform ROS: Psychiatric disorder     Physical Exam Updated Vital Signs BP (!) 111/99 (BP Location: Left Arm)   Pulse (!) 124   Temp 97.9 F (36.6 C) (Oral)   Resp 18   SpO2 97%   Physical Exam  Constitutional: He is oriented to person, place, and time. He appears well-developed and well-nourished. No distress.  HENT:  Head: Normocephalic and atraumatic.  Eyes: Conjunctivae are normal.  Right eye exhibits no discharge. Left eye exhibits no discharge. No scleral icterus.  Neck: Normal range of motion.  Cardiovascular:  Normal skin color, not pale.    Pulmonary/Chest: Effort normal. No stridor. No respiratory distress.  Abdominal: He exhibits no distension.  Musculoskeletal: He exhibits no edema or deformity.  Neurological: He is alert and oriented to person, place, and time. He exhibits normal muscle tone.  Skin: Skin is warm and dry. He is not diaphoretic.  Psychiatric: His affect is labile and inappropriate. His speech is rapid and/or pressured and tangential. He is agitated, hyperactive and actively hallucinating. Thought content is paranoid.  Patient is responding to internal stimuli.  He exhibits word salad, flight of ideas.  He reports he cut the cords and broke his phone so no one could listen in on him.  He is inattentive.  Nursing note and vitals reviewed.    ED Treatments / Results  Labs (all labs ordered are listed, but only abnormal results are displayed) Labs Reviewed  COMPREHENSIVE METABOLIC PANEL - Abnormal; Notable for the following components:      Result Value   Glucose, Bld 108 (*)    BUN 32 (*)    Creatinine, Ser 1.39 (*)    AST 119 (*)    ALT 69 (*)    All other components within normal limits  ACETAMINOPHEN LEVEL - Abnormal; Notable for the following components:   Acetaminophen (Tylenol), Serum <10 (*)    All other components within normal limits  CBC - Abnormal; Notable for the following components:   WBC 10.7 (*)    All other components within normal limits  ETHANOL  SALICYLATE LEVEL  RAPID URINE DRUG SCREEN, HOSP PERFORMED    EKG None  Radiology No results found.  Procedures Procedures (including critical care time)  Medications Ordered in ED Medications  lithium carbonate (LITHOBID) CR tablet 300 mg (300 mg Oral Given 06/15/18 1358)  benztropine (COGENTIN) tablet 0.5 mg (0.5 mg Oral Given 06/15/18 1358)  ondansetron (ZOFRAN)  tablet 4 mg (4 mg Oral Not Given 06/15/18 1422)  ziprasidone (GEODON) injection 20 mg (20 mg Intramuscular Given 06/15/18 1355)  LORazepam (ATIVAN) injection 2 mg (2 mg Intramuscular Given 06/15/18 1356)  diphenhydrAMINE (BENADRYL) injection 50 mg (50 mg Intramuscular Given 06/15/18 1355)  sterile water (preservative free) injection (2.1 mLs  Given 06/15/18 1401)     Initial Impression / Assessment and Plan / ED Course  I have reviewed the triage vital signs and the nursing notes.  Pertinent labs & imaging results that were available during my care of the patient were reviewed by me and considered in my medical decision making (see chart for details).  Clinical Course as of Jun 15 1534  Sat Jun 15, 2018  1247  Patient is medically clear for TCU/SAPPU placement   [EH]    Clinical Course User Index [EH] Cristina GongHammond, Alexya Mcdaris W, PA-C   Patient presents today under IVC after he was found running in between cars, stopping traffic, and generally not making sense prior to arrival.  On my exam he denies any physical complaints or concerns.  He appears floridly manic.  His vitals were mildly tachycardic, however patient is also agitated, constantly picking and moving and suspect that this may be contributing.  Patient medically cleared for psychiatric disposition.  Limited history secondary to psychiatric condition.  Final Clinical Impressions(s) / ED Diagnoses   Final diagnoses:  Manic behavior [facility](HCC)  Agitation    ED Discharge Orders    None       Norman ClayHammond, Gabrial Domine W, PA-C 06/15/18 1536    Linwood DibblesKnapp, Jon, MD 06/16/18 [phone]

## 2018-06-15 NOTE — ED Notes (Signed)
Pt taking a shower 

## 2018-06-15 NOTE — ED Notes (Signed)
Patient started becoming loud and aggressive, yelling at staff and knocking things around in his room.  NP notified.  Meds ordered.
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
## 2018-06-15 NOTE — ED Notes (Signed)
Patient let nurse give him his IM medications without security intervention.  Security on standby.  Patient also took his po medications.

## 2018-06-16 DIAGNOSIS — F12122 Cannabis abuse with intoxication with perceptual disturbance: Secondary | ICD-10-CM

## 2018-06-16 MED ORDER — ZIPRASIDONE MESYLATE 20 MG IM SOLR: 20.0000 mg | Freq: Once | INTRAMUSCULAR | Status: DC | PRN

## 2018-06-16 MED ORDER — DIPHENHYDRAMINE HCL 50 MG/ML IJ SOLN
50.0000 mg | Freq: Once | INTRAMUSCULAR | Status: AC | PRN
  Administered 2018-06-16: 50 mg via INTRAMUSCULAR
  Filled 2018-06-16: qty 1

## 2018-06-16 MED ORDER — LORAZEPAM 2 MG/ML IJ SOLN
2.0000 mg | Freq: Once | INTRAMUSCULAR | Status: AC | PRN
  Administered 2018-06-16: 2 mg via INTRAMUSCULAR
  Filled 2018-06-16: qty 1

## 2018-06-16 MED ORDER — STERILE WATER FOR INJECTION IJ SOLN
INTRAMUSCULAR | Status: AC
  Filled 2018-06-16: qty 10

## 2018-06-16 MED ORDER — ZIPRASIDONE MESYLATE 20 MG IM SOLR
INTRAMUSCULAR | Status: AC
  Administered 2018-06-16: 20 mg
  Filled 2018-06-16: qty 20
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
## 2018-06-16 NOTE — ED Notes (Signed)
Pt requests writer make a note in his chart that he is "off balance due to the mixing medications." pt was encouraged to lay down and if he is unsteady. Pt currently standing in his room door looking out.

## 2018-06-16 NOTE — ED Notes (Signed)
Pt in bed and tearful. Writer face to face with pt and no further treatment needed at this time and pt appears to be calming down.

## 2018-06-16 NOTE — Progress Notes (Signed)
CSW attempted to contact patient's mother, Brita RompLori Bruins, in an effort to obtain collateral information. Brita RompLori Waskey contact number listed on IVC paperwork [phone]) diverts to an automated message stating the phone number has been changed or disconnected.  Enid Cutterharlotte Jya Hughston, LCSW-A Clinical Social Worker [phone]

## 2018-06-16 NOTE — ED Notes (Signed)
Pt pacing in hall after Field seismologistcalling writer a "cracker bitch" and telling Clinical research associatewriter to "get the fuck out my face."

## 2018-06-16 NOTE — Discharge Instructions (Signed)
For your mental health needs, you are advised to follow up with [facility].  New and returning patients are seen at their walk-in clinic.  Walk-in hours are Monday - Friday from 8:00 am - 3:00 pm.  Walk-in patients are seen on a first come, first served basis.  Try to arrive as early as possible for he best chance of being seen the same day: ° °     [facility] °     [address] °     [place], [place] [address] °     [phone] °
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
## 2018-06-16 NOTE — Progress Notes (Signed)
CSW provided patient's nurse with bus pass. Patient medically and psychiatrically cleared for discharge.  CSW signing off.  Enid Cutterharlotte Julius Matus, LCSW-A Clinical Social Worker [phone]

## 2018-06-16 NOTE — ED Notes (Signed)
Pt discharged safely after reviewing discharge instructions.  All belongings were returned to patient.
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
## 2018-06-16 NOTE — BH Assessment (Signed)
[facility] Assessment Progress Note     Per Elta GuadeloupeLaurie Parks, NP and Dr Jannifer FranklinAkintayo, patient is appropriate for discharge and can [facility] and medication management.

## 2018-06-19 ENCOUNTER — Other Ambulatory Visit: Payer: Self-pay

## 2018-06-19 ENCOUNTER — Emergency Department (HOSPITAL_COMMUNITY)
Admission: EM | Admit: 2018-06-19 | Discharge: 2018-06-21 | Disposition: A | Discharge status: Home / Self Care | Payer: Self-pay | Attending: Emergency Medicine | Admitting: Emergency Medicine

## 2018-06-19 ENCOUNTER — Encounter (HOSPITAL_COMMUNITY): Payer: Self-pay | Admitting: *Deleted

## 2018-06-19 DIAGNOSIS — F250 Schizoaffective disorder, bipolar type: Secondary | ICD-10-CM | POA: Diagnosis present

## 2018-06-19 DIAGNOSIS — F1910 Other psychoactive substance abuse, uncomplicated: Secondary | ICD-10-CM | POA: Diagnosis present

## 2018-06-19 DIAGNOSIS — Z79899 Other long term (current) drug therapy: Secondary | ICD-10-CM | POA: Insufficient documentation

## 2018-06-19 DIAGNOSIS — J45909 Unspecified asthma, uncomplicated: Secondary | ICD-10-CM | POA: Insufficient documentation

## 2018-06-19 DIAGNOSIS — F17210 Nicotine dependence, cigarettes, uncomplicated: Secondary | ICD-10-CM | POA: Insufficient documentation

## 2018-06-19 DIAGNOSIS — F3010 Manic episode without psychotic symptoms, unspecified: Secondary | ICD-10-CM

## 2018-06-19 DIAGNOSIS — F259 Schizoaffective disorder, unspecified: Secondary | ICD-10-CM | POA: Insufficient documentation

## 2018-06-19 LAB — COMPREHENSIVE METABOLIC PANEL
ALT: 72 U/L — AB (ref 0–44)
ANION GAP: 16 — AB (ref 5–15)
AST: 88 U/L — ABNORMAL HIGH (ref 15–41)
Albumin: 4.4 g/dL (ref 3.5–5.0)
Alkaline Phosphatase: 82 U/L (ref 38–126)
BUN: 27 mg/dL — ABNORMAL HIGH (ref 6–20)
CHLORIDE: 97 mmol/L — AB (ref 98–111)
CO2: 25 mmol/L (ref 22–32)
CREATININE: 1.61 mg/dL — AB (ref 0.61–1.24)
Calcium: 9.4 mg/dL (ref 8.9–10.3)
GFR calc non Af Amer: 57 mL/min — ABNORMAL LOW (ref >60)
Glucose, Bld: 104 mg/dL — ABNORMAL HIGH (ref 70–99)
POTASSIUM: 3.6 mmol/L (ref 3.5–5.1)
SODIUM: 138 mmol/L (ref 135–145)
Total Bilirubin: 1.4 mg/dL — ABNORMAL HIGH (ref 0.3–1.2)
Total Protein: 7.6 g/dL (ref 6.5–8.1)

## 2018-06-19 LAB — CBC
HCT: 35.5 % — ABNORMAL LOW (ref 39.0–52.0)
HEMOGLOBIN: 12.1 g/dL — AB (ref 13.0–17.0)
MCH: 29.5 pg (ref 26.0–34.0)
MCHC: 34.1 g/dL (ref 30.0–36.0)
MCV: 86.6 fL (ref 78.0–100.0)
PLATELETS: 339 10*3/uL (ref 150–400)
RBC: 4.1 MIL/uL — AB (ref 4.22–5.81)
RDW: 12 % (ref 11.5–15.5)
WBC: 10.1 10*3/uL (ref 4.0–10.5)

## 2018-06-19 LAB — RAPID URINE DRUG SCREEN, HOSP PERFORMED
AMPHETAMINES: NOT DETECTED
Barbiturates: NOT DETECTED
Benzodiazepines: NOT DETECTED
COCAINE: NOT DETECTED
TETRAHYDROCANNABINOL: POSITIVE — AB

## 2018-06-19 LAB — SALICYLATE LEVEL

## 2018-06-19 LAB — ACETAMINOPHEN LEVEL

## 2018-06-19 LAB — ETHANOL: Alcohol, Ethyl (B): <10 mg/dL (ref <10)

## 2018-06-19 MED ORDER — OLANZAPINE 10 MG IM SOLR
10.0000 mg | Freq: Two times a day (BID) | INTRAMUSCULAR | Status: DC | PRN
  Administered 2018-06-21: 10 mg via INTRAMUSCULAR
  Filled 2018-06-19: qty 10

## 2018-06-19 MED ORDER — DIPHENHYDRAMINE HCL 50 MG/ML IJ SOLN
50.0000 mg | Freq: Two times a day (BID) | INTRAMUSCULAR | Status: DC | PRN
  Filled 2018-06-19 (×3): qty 1

## 2018-06-19 MED ORDER — STERILE WATER FOR INJECTION IJ SOLN
INTRAMUSCULAR | Status: AC
  Filled 2018-06-19: qty 10

## 2018-06-19 MED ORDER — OLANZAPINE 10 MG PO TABS
10.0000 mg | ORAL_TABLET | Freq: Every day | ORAL | Status: DC
  Administered 2018-06-19: 10 mg via ORAL
  Filled 2018-06-19: qty 1

## 2018-06-19 MED ORDER — ZIPRASIDONE MESYLATE 20 MG IM SOLR
20.0000 mg | Freq: Once | INTRAMUSCULAR | Status: AC
  Administered 2018-06-19: 20 mg via INTRAMUSCULAR
  Filled 2018-06-19: qty 20

## 2018-06-19 MED ORDER — STERILE WATER FOR INJECTION IJ SOLN
INTRAMUSCULAR | Status: AC
  Administered 2018-06-19: 1.2 mL
  Filled 2018-06-19: qty 10

## 2018-06-19 MED ORDER — HYDROXYZINE HCL 25 MG PO TABS: 25.0000 mg | ORAL_TABLET | Freq: Three times a day (TID) | ORAL | Status: DC | PRN

## 2018-06-19 MED ORDER — LORAZEPAM 1 MG PO TABS: 1.0000 mg | ORAL_TABLET | Freq: Once | ORAL | Status: DC

## 2018-06-19 MED ORDER — BENZTROPINE MESYLATE 0.5 MG PO TABS
0.5000 mg | ORAL_TABLET | Freq: Every day | ORAL | Status: DC
  Administered 2018-06-19 – 2018-06-20 (×2): 0.5 mg via ORAL
  Filled 2018-06-19 (×2): qty 1

## 2018-06-19 MED ORDER — TRAZODONE HCL 50 MG PO TABS
50.0000 mg | ORAL_TABLET | Freq: Every evening | ORAL | Status: DC | PRN
  Filled 2018-06-19: qty 1

## 2018-06-19 MED ORDER — LORAZEPAM 2 MG/ML IJ SOLN
2.0000 mg | Freq: Once | INTRAMUSCULAR | Status: AC
  Administered 2018-06-19: 2 mg via INTRAMUSCULAR

## 2018-06-19 MED ORDER — LORAZEPAM 2 MG/ML IJ SOLN
INTRAMUSCULAR | Status: AC
  Filled 2018-06-19: qty 1

## 2018-06-19 MED ORDER — LORAZEPAM 2 MG/ML IJ SOLN: 2.0000 mg | Freq: Once | INTRAMUSCULAR | Status: DC

## 2018-06-19 NOTE — ED Notes (Signed)
Patient dressed into scrubs and belongings are behind nurses station labeled with green tag and patient label.

## 2018-06-19 NOTE — ED Notes (Signed)
Pt is agitated upon arrival to unit.  He appears manic and is getting other patient agitated on unit.  Security was able to redirect patient to his room without difficulty.  15 minute checks and video monitoring in place.

## 2018-06-19 NOTE — ED Notes (Addendum)
Patient in triage room stating to writer and triage RN, "I want a Malaw[place]key sandwich and to go to the 'back back' to lay down in a bed and watch [place] [employer]". Patient very anxious and hyper in triage room.
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
## 2018-06-19 NOTE — ED Notes (Signed)
Bed: WBH35 Expected date:  Expected time:  Means of arrival:  Comments: 

## 2018-06-19 NOTE — ED Notes (Addendum)
Patient attempted to give urine sample in bathroom but instead washed his feet in the sink with hand-sanitizer. Will continue to try to obtain urine sample.

## 2018-06-19 NOTE — ED Notes (Signed)
Pt belongings locked up in locker #35. Belongings: Black sandals, green long sleeved shirt, blue & red [place] with white number, black socks (1 pair), blue socks (1 pair), gray fleece pants with white string, black flip phone. Belongings were itemized on pt belongings sheet located in pt chart at nurses station. Pt was unable to sign belongings sheet. Pts RN also signed as belongings were itemized in front of her.

## 2018-06-19 NOTE — ED Notes (Signed)
Patient continually pacing back and forth in room. Has been told multiply times to sit down, follows commands well.

## 2018-06-19 NOTE — ED Notes (Signed)
Patient drinking from sink in triage room.

## 2018-06-19 NOTE — ED Notes (Signed)
Pt A&O x 3, no distress noted, calm & cooperative at present.  Monitoring for safety, Q 15 min checks in effect. 

## 2018-06-19 NOTE — ED Notes (Signed)
Patient's belongings placed in cabinets at nurses station labeled "Res A". Primary RN notified where belongings were placed.

## 2018-06-19 NOTE — ED Notes (Signed)
Bed: [facility]1 Expected date:  Expected time:  Means of arrival:  Comments: 

## 2018-06-19 NOTE — ED Provider Notes (Signed)
[facility]-EMERGENCY DEPT Provider Note   CSN: [id number] Arrival date & time: 06/19/18  0228     History   Chief Complaint Chief Complaint  Patient presents with  . Medical Clearance    HPI Philip [name] is a 27 y.o. male.  Patient presents to the emergency department with a chief complaint of manic state.  Patient brings himself to the emergency department.  He is complaining of seeing shadows and needing to get a [place] sandwich and watch [place] [employer].  He denies any suicidal or homicidal ideation.  He denies any drug use.  Reports compliance with his medications except for Seroquel, which she cannot afford.  Denies being in any pain.  The history is provided by the patient. No language interpreter was used.    Past Medical History:  Diagnosis Date  . ADHD (attention deficit hyperactivity disorder)   . Asthma   . Bipolar affective disorder (HCC)   . Schizophrenia [facility](HCC)     Patient Active Problem List   Diagnosis Date Noted  . 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 Schizoaffective disorder (HCC) 11/09/2015  . Mood disorder (HCC)   . 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 Bipolar I disorder with mania (HCC)   . Agitation 09/16/2014  . Polysubstance abuse (HCC) 09/16/2014  . Substance induced mood disorder (HCC) 09/16/2014  . Cannabis use disorder, severe, dependence (HCC)   . Hyperammonemia (HCC) 09/07/2014  . Schizoaffective disorder, bipolar type (HCC)   . 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 NO PAST SURGERIES          Home Medications    Prior to Admission medications   Medication Sig Start Date End Date Taking? Authorizing Provider  amitriptyline (ELAVIL) 25 MG tablet Take 25 mg by mouth at bedtime.    [provider]  amphetamine-dextroamphetamine (ADDERALL) 5 MG tablet Take 5 mg by  mouth 2 (two) times daily with a meal.    [provider]  benztropine (COGENTIN) 1 MG tablet Take 1 tablet (1 mg total) by mouth 2 (two) times daily. Patient not taking: Reported on 06/14/2018 11/10/15   [name], [name], FNP  hydrOXYzine (ATARAX/VISTARIL) 25 MG tablet Take 1 tablet (25 mg total) by mouth every 6 (six) hours as needed for anxiety. Patient not taking: Reported on 06/14/2018 11/10/15   Withrow, Everardo AllJohn C, FNP  OLANZapine zydis (ZYPREXA) 15 MG disintegrating tablet Take 1 tablet (15 mg total) by mouth 2 (two) times daily after a meal. Patient not taking: Reported on 06/14/2018 11/10/15   Withrow, Everardo AllJohn C, FNP  Oxcarbazepine (TRILEPTAL) 300 MG tablet Take 300 mg by mouth 2 (two) times daily.    [provider]  promethazine-codeine (PHENERGAN WITH CODEINE) 6.25-10 MG/5ML syrup Take 5 mLs by mouth every 6 (six) hours as needed for cough.    [provider]  traZODone (DESYREL) 100 MG tablet Take 1 tablet (100 mg total) by mouth at bedtime as needed (agitation). Patient not taking: Reported on 06/14/2018 11/10/15   [name], [name], FNP    Family History Family History  Problem Relation Age of Onset  . Hypertension Mother     Social History Social History   Tobacco Use  . Smoking status: Current Every Day Smoker    Packs/day: 1.00    Years: 6.00    Pack years: 6.00    Types: Cigarettes  .  Tobacco comment: unable to assess  Substance Use Topics  . Alcohol use: Yes    Alcohol/week: 1.0 standard drinks    Types: 1 Cans of beer per week    Comment: unable to assess  . Drug use: Yes    Types: Marijuana    Comment: unable to assess     Allergies   Shrimp [shellfish allergy]   Review of Systems Review of Systems  All other systems reviewed and are negative.    Physical Exam Updated Vital Signs BP (!) 135/95 (BP Location: Left Arm)   Pulse (!) 119   Temp 99.4 F (37.4 C) (Oral)   Resp (!) 22   Wt 68.9 kg   SpO2 100%   BMI 20.61 kg/m    Physical Exam  Constitutional: He is oriented to person, place, and time. He appears well-developed and well-nourished.  Washing feet with hand sanitizer during my exam  HENT:  Head: Normocephalic and atraumatic.  Eyes: Pupils are equal, round, and reactive to light. Conjunctivae and EOM are normal. Right eye exhibits no discharge. Left eye exhibits no discharge. No scleral icterus.  Neck: Normal range of motion. Neck supple. No JVD present.  Cardiovascular: Normal rate, regular rhythm and normal heart sounds. Exam reveals no gallop and no friction rub.  No murmur heard. Pulmonary/Chest: Effort normal and breath sounds normal. No respiratory distress. He has no wheezes. He has no rales. He exhibits no tenderness.  Abdominal: Soft. He exhibits no distension and no mass. There is no tenderness. There is no rebound and no guarding.  Musculoskeletal: Normal range of motion. He exhibits no edema or tenderness.  Neurological: He is alert and oriented to person, place, and time.  Skin: Skin is warm and dry.  Psychiatric:  Rapid tangential speech manic  Nursing note and vitals reviewed.    ED Treatments / Results  Labs (all labs ordered are listed, but only abnormal results are displayed) Labs Reviewed  COMPREHENSIVE METABOLIC PANEL  ETHANOL  SALICYLATE LEVEL  ACETAMINOPHEN LEVEL  CBC  RAPID URINE DRUG SCREEN, HOSP PERFORMED    EKG None  Radiology No results found.  Procedures Procedures (including critical care time)  Medications Ordered in ED Medications - No data to display   Initial Impression / Assessment and Plan / ED Course  I have reviewed the triage vital signs and the nursing notes.  Pertinent labs & imaging results that were available during my care of the patient were reviewed by me and considered in my medical decision making (see chart for details).    Patient here in manic state.  Reports seeing shadows.  Rapid tangential speaking and thoughts.    Labs  pending.  TTS evaluation.  Laboratory work-up is at or near baseline.  Patient is medically clear for psychiatric evaluation.  Final Clinical Impressions(s) / ED Diagnoses   Final diagnoses:  Manic behavior [facility](HCC)    ED Discharge Orders    None       Roxy HorsemanBrowning, Ruthanne Mcneish, PA-C 06/19/18 0555    Nira Connardama, Pedro Eduardo, MD 06/19/18 [phone]

## 2018-06-19 NOTE — ED Notes (Signed)
Pt cursing and making derogatory remarks towards staff, cur[facility] at [facility] and staff members.

## 2018-06-19 NOTE — ED Triage Notes (Signed)
Pt arrives ambulatory to triage room, he says "the police brought me here" - no one with the patient at this time. He has rapid, rambling speech. He says he needs "to go to the back, back, get me a [place]key sandwich and kick it up and watch some [place] [employer]" He says that he has been taking his medications, but then says that he cannot afford his seroquel. He denies SI or HI.

## 2018-06-19 NOTE — BH Assessment (Signed)
[facility]  Per Juanetta BeetsJacqueline Norman, DO, this pt requires psychiatric hospitalization.  Pt presents under IVC initiated by EDP Raeford RazorStephen Kohut, MD.  At 16:07 this writer spoke to FultonNatalie at [facility] and obtained [facility], authorization [id number] from 06/19/2018 - 06/25/2018.  Please note that authorization does not mean that pt has been accepted to the facility.  At 16:10 I called [facility] and spoke to Vonna KotykJay, who accepted demographic information by telephone.  I specified to him that, due to pt's combative behavioral [facility], we are requesting that pt be prioritized.  [facility].  York GriceJonathan Riffey, LCSW, agrees to follow up on this referral by calling [facility] at [phone].  As of this writing a final decision is pending.  Additionally, the following facilities have been contacted to seek placement for this pt, with results as noted:  Beds available, information sent, decision pending:  [facility]   Declined:  [place] (due to pt acuity)   At capacity:  [facility]  Nicolae Vasek, [place] Behavioral Health Coordinator [phone]   Doylene Canninghomas Tamy Accardo, MA Triage Specialist [phone]
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
## 2018-06-19 NOTE — BH Assessment (Signed)
Philip ConnJason Berry, NP, overnight observation for safety, reevaluate in the morning. Doctor and Philip RuizJohn, RN notified of disposition.

## 2018-06-19 NOTE — ED Notes (Signed)
Pt pushing bedside table into exit doors.  Security and [employer] at bedside for assistance.
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
## 2018-06-19 NOTE — ED Notes (Signed)
Patient did not come when called for triage/vitals. First attempt.
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
## 2018-06-19 NOTE — Progress Notes (Signed)
Patient ID: Philip [name], male   DOB: July 01, 1991, 27 y.o.   MRN: [id number]  Review[facility]. Pt is not appropriate for our unit due to acuity.

## 2018-06-20 DIAGNOSIS — F17210 Nicotine dependence, cigarettes, uncomplicated: Secondary | ICD-10-CM

## 2018-06-20 DIAGNOSIS — F250 Schizoaffective disorder, bipolar type: Secondary | ICD-10-CM

## 2018-06-20 DIAGNOSIS — F419 Anxiety disorder, unspecified: Secondary | ICD-10-CM

## 2018-06-20 LAB — CBC
HCT: 33.8 % — ABNORMAL LOW (ref 39.0–52.0)
HEMOGLOBIN: 11.3 g/dL — AB (ref 13.0–17.0)
MCH: 29 pg (ref 26.0–34.0)
MCHC: 33.4 g/dL (ref 30.0–36.0)
MCV: 86.7 fL (ref 78.0–100.0)
Platelets: 342 10*3/uL (ref 150–400)
RBC: 3.9 MIL/uL — ABNORMAL LOW (ref 4.22–5.81)
RDW: 12 % (ref 11.5–15.5)
WBC: 6.8 10*3/uL (ref 4.0–10.5)

## 2018-06-20 LAB — COMPREHENSIVE METABOLIC PANEL
ALT: 68 U/L — ABNORMAL HIGH (ref 0–44)
ANION GAP: 11 (ref 5–15)
AST: 82 U/L — AB (ref 15–41)
Albumin: 4.1 g/dL (ref 3.5–5.0)
Alkaline Phosphatase: 80 U/L (ref 38–126)
BILIRUBIN TOTAL: 0.7 mg/dL (ref 0.3–1.2)
BUN: 18 mg/dL (ref 6–20)
CO2: 29 mmol/L (ref 22–32)
Calcium: 9.4 mg/dL (ref 8.9–10.3)
Chloride: 93 mmol/L — ABNORMAL LOW (ref 98–111)
Creatinine, Ser: 1.17 mg/dL (ref 0.61–1.24)
GFR calc Af Amer: >60 mL/min (ref >60)
GFR calc non Af Amer: >60 mL/min (ref >60)
GLUCOSE: 106 mg/dL — AB (ref 70–99)
Potassium: 3.6 mmol/L (ref 3.5–5.1)
Sodium: 133 mmol/L — ABNORMAL LOW (ref 135–145)
Total Protein: 7.1 g/dL (ref 6.5–8.1)

## 2018-06-20 MED ORDER — ZIPRASIDONE MESYLATE 20 MG IM SOLR
20.0000 mg | Freq: Once | INTRAMUSCULAR | Status: AC
  Administered 2018-06-20: 20 mg via INTRAMUSCULAR
  Filled 2018-06-20: qty 20

## 2018-06-20 MED ORDER — HYDROXYZINE HCL 25 MG PO TABS
50.0000 mg | ORAL_TABLET | Freq: Three times a day (TID) | ORAL | Status: DC
  Administered 2018-06-20 – 2018-06-21 (×3): 50 mg via ORAL
  Filled 2018-06-20 (×3): qty 2

## 2018-06-20 MED ORDER — STERILE WATER FOR INJECTION IJ SOLN
INTRAMUSCULAR | Status: AC
  Administered 2018-06-20: 10 mL
  Filled 2018-06-20: qty 10

## 2018-06-20 MED ORDER — LORAZEPAM 2 MG/ML IJ SOLN
2.0000 mg | Freq: Once | INTRAMUSCULAR | Status: AC
  Administered 2018-06-20: 2 mg via INTRAMUSCULAR
  Filled 2018-06-20: qty 1

## 2018-06-20 MED ORDER — ZOLPIDEM TARTRATE 10 MG PO TABS
10.0000 mg | ORAL_TABLET | Freq: Every day | ORAL | Status: DC
  Administered 2018-06-20: 10 mg via ORAL
  Filled 2018-06-20: qty 1

## 2018-06-20 MED ORDER — ASENAPINE MALEATE 5 MG SL SUBL
10.0000 mg | SUBLINGUAL_TABLET | Freq: Two times a day (BID) | SUBLINGUAL | Status: DC
  Administered 2018-06-20 – 2018-06-21 (×3): 10 mg via SUBLINGUAL
  Filled 2018-06-20 (×3): qty 2

## 2018-06-20 MED ORDER — DIPHENHYDRAMINE HCL 50 MG/ML IJ SOLN
50.0000 mg | Freq: Once | INTRAMUSCULAR | Status: AC
  Administered 2018-06-20: 50 mg via INTRAMUSCULAR

## 2018-06-20 NOTE — ED Notes (Signed)
Pt continues in seclusion room. Reports that he still needs to be in there. Lunch tray given to him. Pt improving. Continues to be on 1:1.

## 2018-06-20 NOTE — ED Notes (Signed)
Pt A&O x 3, no distress, calm & cooperative, ambulatory about the department.  Monitoring for safety, Q 15 min checks in effect.
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
## 2018-06-20 NOTE — ED Notes (Signed)
Pt awake, hyper, talking a lot, but pleasant and redirectable.
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
## 2018-06-20 NOTE — BH Assessment (Signed)
[facility]  Per Juanetta BeetsJacqueline Norman, DO this pt continues to require psychiatric hospitalization.  Archie BalboaMackenzie Irwin, LCSW shares voice mail with this writer consisting of [facility] that I made yesterday.  At 11:05 I called them and spoke to [place]. She requests past records of pt's CMET's, along with a repeat CMET and CBC.  She asks if pt has known record of hepatitis, and after reviewing pt's medical history, I verbally inform her that pt does not.  She reports that she does not need testing for this at this time.  Dr Sharma CovertNorman agrees to order repeat CMET and CBC.  Thea SilversmithMackenzie agrees to [facility] once results are returned, and to follow up with them by phone.  I have also [facility], and at 11:30 Delice Bisonara confirms receipt.  Final decision is pending as of this writing.  Doylene Canninghomas Ronnie Mallette, [place] Behavioral Health Coordinator [phone]

## 2018-06-21 MED ORDER — BENZTROPINE MESYLATE 0.5 MG PO TABS: 0.5000 mg | ORAL_TABLET | Freq: Every day | ORAL | 0 refills | Status: DC

## 2018-06-21 MED ORDER — ACETAMINOPHEN 325 MG PO TABS: 650.0000 mg | ORAL_TABLET | Freq: Four times a day (QID) | ORAL | Status: DC | PRN

## 2018-06-21 MED ORDER — STERILE WATER FOR INJECTION IJ SOLN
INTRAMUSCULAR | Status: AC
  Administered 2018-06-21: 10 mL
  Filled 2018-06-21: qty 10

## 2018-06-21 MED ORDER — IBUPROFEN 800 MG PO TABS
800.0000 mg | ORAL_TABLET | Freq: Three times a day (TID) | ORAL | Status: DC | PRN
  Administered 2018-06-21: 800 mg via ORAL
  Filled 2018-06-21: qty 1

## 2018-06-21 MED ORDER — TRAZODONE HCL 50 MG PO TABS: 50.0000 mg | ORAL_TABLET | Freq: Every day | ORAL | 0 refills | Status: DC

## 2018-06-21 MED ORDER — TRAZODONE HCL 50 MG PO TABS: 50.0000 mg | ORAL_TABLET | Freq: Every day | ORAL | Status: DC

## 2018-06-21 MED ORDER — HYDROXYZINE HCL 50 MG PO TABS: 50.0000 mg | ORAL_TABLET | Freq: Three times a day (TID) | ORAL | 0 refills | Status: DC

## 2018-06-21 MED ORDER — ASENAPINE MALEATE 5 MG SL SUBL: 10.0000 mg | SUBLINGUAL_TABLET | Freq: Two times a day (BID) | SUBLINGUAL | 0 refills | Status: DC

## 2018-06-21 NOTE — ED Notes (Signed)
Pt discharged home. Discharged instructions read to pt who verbalized understanding. All belongings returned to pt who signed for same. Denies SI/HI, is not delusional and not responding to internal stimuli. Escorted pt to the ED exit.    

## 2018-06-21 NOTE — BH Assessment (Signed)
[facility]  Per Juanetta BeetsJacqueline Norman, DO, this pt does not require psychiatric hospitalization at this time.  Pt presents under IVC initiated by EDP Raeford RazorStephen Kohut, MD, which Dr Sharma CovertNorman has rescinded.  [facility]  This has been included in pt's discharge instructions.  Pt's nurse, Diane, has been notified.  Doylene Canninghomas Braidyn Peace, MA Triage Specialist [phone]

## 2018-06-21 NOTE — Consult Note (Signed)
[facility] Psych ED Discharge  06/21/2018 11:40 AM SEKOU ZUCKERMAN  MRN:  [id number] Principal Problem: Schizoaffective disorder, bipolar type [facility]) Discharge Diagnoses:  Patient Active Problem List   Diagnosis Date Noted  . 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 Schizoaffective disorder (HCC) [F25.9] 11/09/2015  . Mood disorder (HCC) [F39]   . Bipolar I disorder with mania (HCC) [F31.10]   . Agitation [R45.1] 09/16/2014  . Polysubstance abuse (HCC) [F19.10] 09/16/2014  . Cannabis use disorder, severe, dependence (HCC) [F12.20]   . Hyperammonemia (HCC) [E72.20] 09/07/2014  . Schizoaffective disorder, bipolar type (HCC) [F25.0]   . 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 He reports that Seroquel has helped in the past. He slept well yesterday although more during the day. He denies SI, HI or AVH.   Total Time spent with patient: 30 minutes  Past Psychiatric History: Schizoaffective disorder, BPAD and ADHD.   Past Medical History:  Past Medical History:  Diagnosis Date  . ADHD (attention deficit hyperactivity disorder)   . Asthma   . Bipolar affective disorder (HCC)   . Schizophrenia [facility])    Past Surgical History:  Procedure Laterality Date  . NO PAST SURGERIES     Family History:  Family History  Problem Relation Age of Onset  . Hypertension Mother    Family Psychiatric  History: Unknown  Social History:  Social History   Substance and Sexual Activity  Alcohol Use Yes  . Alcohol/week: 1.0 standard drinks  . Types: 1 Cans of beer per week   Comment: unable to assess    Social History   Substance and Sexual Activity  Drug Use Yes  . Types: Marijuana   Comment: unable to assess   Social History   Socioeconomic History  . Marital status: Married    Spouse name: Not on file  . Number of children: Not on file  .  Years of education: Not on file  . Highest education level: Not on file  Occupational History  . Not on file  Social Needs  . Financial resource strain: Not on file  . Food insecurity:    Worry: Not on file    Inability: Not on file  . Transportation needs:    Medical: Not on file    Non-medical: Not on file  Tobacco Use  . Smoking status: Current Every Day Smoker    Packs/day: 1.00    Years: 6.00    Pack years: 6.00    Types: Cigarettes  . Tobacco comment: unable to assess  Substance and Sexual Activity  . Alcohol use: Yes    Alcohol/week: 1.0 standard drinks    Types: 1 Cans of beer per week    Comment: unable to assess  . Drug use: Yes    Types: Marijuana    Comment: unable to assess  . Sexual activity: Yes    Comment: unable to access  Lifestyle  . Physical activity:    Days per week: Not on file    Minutes per session: Not on file  . Stress: Not on file  Relationships  . Social connections:    Talks on phone: Not on file    Gets together: Not on file    Attends religious service: Not on file    Active member of club or organization: Not on file    Attends meetings of clubs or organizations: Not  on file    Relationship status: Not on file  Other Topics Concern  . Not on file  Social History Narrative  . Not on file    Has this patient used any form of tobacco in the last 30 days? (Cigarettes, Smokeless Tobacco, Cigars, and/or Pipes) Prescription not provided because: N/A  Current Medications: Current Facility-Administered Medications  Medication Dose Route Frequency Provider Last Rate Last Dose  . acetaminophen (TYLENOL) tablet 650 mg  650 mg Oral Q6H PRN Garlon HatchetSanders, Lisa M, PA-C      . asenapine (SAPHRIS) sublingual tablet 10 mg  10 mg Sublingual BID Charm RingsLord, Jamison Y, NP   10 mg at 06/21/18 1018  . benztropine (COGENTIN) tablet 0.5 mg  0.5 mg Oral QHS Cherly Beachorman, Kenneshia Rehm J, DO   0.5 mg at 06/20/18 2126  . OLANZapine (ZYPREXA) injection 10 mg  10 mg  Intramuscular BID PRN Cherly Beachorman, Lorraine Cimmino J, DO   10 mg at 06/21/18 [id number]   Or  . diphenhydrAMINE (BENADRYL) injection 50 mg  50 mg Intramuscular BID PRN Cherly BeachNorman, Adriyanna Christians J, DO      . hydrOXYzine (ATARAX/VISTARIL) tablet 50 mg  50 mg Oral TID Charm RingsLord, Jamison Y, NP   50 mg at 06/21/18 1018  . ibuprofen (ADVIL,MOTRIN) tablet 800 mg  800 mg Oral Q8H PRN Garlon HatchetSanders, Lisa M, PA-C   800 mg at 06/21/18 [id number]  . zolpidem (AMBIEN) tablet 10 mg  10 mg Oral QHS Charm RingsLord, Jamison Y, NP   10 mg at 06/20/18 2126   Current Outpatient Medications  Medication Sig Dispense Refill  . benztropine (COGENTIN) 0.5 MG tablet Take 0.5 mg by mouth 2 (two) times daily as needed for tremors.   1  . promethazine (PHENERGAN) 6.25 MG/5ML syrup Take 5 mLs by mouth every 6 (six) hours as needed for nausea.   0  . amphetamine-dextroamphetamine (ADDERALL) 5 MG tablet Take 5 mg by mouth 2 (two) times daily with a meal.    . hydrOXYzine (ATARAX/VISTARIL) 25 MG tablet Take 1 tablet (25 mg total) by mouth every 6 (six) hours as needed for anxiety. (Patient not taking: Reported on 06/14/2018) 30 tablet 0  . OLANZapine zydis (ZYPREXA) 15 MG disintegrating tablet Take 1 tablet (15 mg total) by mouth 2 (two) times daily after a meal. (Patient not taking: Reported on 06/14/2018) 28 tablet 0  . traZODone (DESYREL) 100 MG tablet Take 1 tablet (100 mg total) by mouth at bedtime as needed (agitation). (Patient not taking: Reported on 06/14/2018) 14 tablet 0   PTA Medications:  (Not in a hospital admission)  Musculoskeletal: Strength & Muscle Tone: within normal limits Gait & Station: normal Patient leans: N/A  Psychiatric Specialty Exam: Physical Exam  Nursing note and vitals reviewed. Constitutional: He is oriented to person, place, and time. He appears well-developed and well-nourished.  HENT:  Head: Normocephalic and atraumatic.  Neck: Normal range of motion.  Respiratory: Effort normal.  Musculoskeletal: Normal range of motion.   Neurological: He is alert and oriented to person, place, and time.  Skin: No rash noted.  Psychiatric: He has a normal mood and affect. His speech is normal and behavior is normal. Thought content normal. Cognition and memory are normal. He expresses impulsivity.    Review of Systems  Psychiatric/Behavioral: Positive for substance abuse. Negative for depression, hallucinations and suicidal ideas. The patient is not nervous/anxious and does not have insomnia.   All other systems reviewed and are negative.   Blood pressure (!) 148/95, pulse 100, temperature 98.2 F (36.8  C), temperature source Oral, resp. rate 20, weight 68.9 kg, SpO2 100 %.Body mass index is 20.61 kg/m.  General Appearance: Fairly Groomed, young, African American male, wearing paper hospital scrubs and sitting in a chair. NAD.   Eye Contact:  Good  Speech:  Clear and Coherent and Normal Rate  Volume:  Normal  Mood:  Euthymic  Affect:  Appropriate and Congruent  Thought Process:  Goal Directed, Linear and Descriptions of Associations: Intact although tangential at times when elaborating on information.   Orientation:  Full (Time, Place, and Person)  Thought Content:  Logical  Suicidal Thoughts:  No  Homicidal Thoughts:  No  Memory:  Immediate;   Good Recent;   Good Remote;   Good  Judgement:  Fair  Insight:  Fair  Psychomotor Activity:  Normal  Concentration:  Concentration: Good and Attention Span: Good   Recall:  Good  Fund of Knowledge:  Good  Language:  Good  Akathisia:  No  Handed:  Right  AIMS (if indicated):   N/A  Assets:  Communication Skills Desire for Improvement Physical Health Resilience Social Support  ADL's:  Intact  Cognition:  WNL  Sleep:   Fair   Assessment:  Philip [name] is a 27 y.o. male who was admitted with mania. He improved with medication management and is stable for discharge. He denies SI, HI or AVH. He is more organized in thought process. His presentation was also  significantly believed to be behavioral in nature too since he did well with negative reinforcement. He will follow up with his outpatient psychiatrist.   Demographic Factors:  Male and Adolescent or young adult  Loss Factors: NA  Historical Factors: Impulsivity  Risk Reduction Factors:   Living with another person, especially a relative, Positive social support and Positive therapeutic relationship  Continued Clinical Symptoms:  Schizoaffective disorder   Cognitive Features That Contribute To Risk:  Closed-mindedness    Suicide Risk:  Minimal: No identifiable suicidal ideation.  Patients presenting with no risk factors but with morbid ruminations; may be classified as minimal risk based on the severity of the depressive symptoms    Plan Of Care/Follow-up recommendations:  -Continue Saphris 10 mg BID for mood stabilization. -Continue Cogentin 0.5 mg qhs for EPS prevention. -Continue Atarax 50 mg TID for anxiety.  -Continue Trazodone 50 mg qhs PRN for insomnia.  -Follow up with your outpatient provider at [facility].    Disposition: Discharge to home. Cherly Beach, DO 06/21/2018, 11:40 AM

## 2018-06-21 NOTE — BH Assessment (Signed)
[facility] Assessment Progress Note  Repeat CMET and CBC [facility], and at 09:35 "EJ" confirms receipt.  As of this writing, decision is pending.  Philip [name], [place] Behavioral Health Coordinator [phone]

## 2018-06-21 NOTE — ED Notes (Signed)
Pt up early and being loud and intrusive. Other patients complaining and getting upset. Medication given to help pt de-escalate.

## 2018-06-21 NOTE — ED Notes (Signed)
Compliant of right foot pain.

## 2018-06-21 NOTE — Discharge Instructions (Signed)
For your mental health needs, you are advised to follow up with [facility].  New and returning patients are seen at their walk-in clinic.  Walk-in hours are Monday - Friday from 8:00 am - 3:00 pm.  Walk-in patients are seen on a first come, first served basis.  Try to arrive as early as possible for he best chance of being seen the same day: ° °     [facility] °     [address] °     [place], [place] [address] °     [phone] °

## 2018-06-24 ENCOUNTER — Emergency Department (HOSPITAL_COMMUNITY)
Admission: EM | Admit: 2018-06-24 | Discharge: 2018-06-25 | Disposition: A | Discharge status: Home / Self Care | Payer: Self-pay | Attending: Emergency Medicine | Admitting: Emergency Medicine

## 2018-06-24 ENCOUNTER — Encounter (HOSPITAL_COMMUNITY): Payer: Self-pay | Admitting: *Deleted

## 2018-06-24 DIAGNOSIS — Z79899 Other long term (current) drug therapy: Secondary | ICD-10-CM | POA: Insufficient documentation

## 2018-06-24 DIAGNOSIS — F17210 Nicotine dependence, cigarettes, uncomplicated: Secondary | ICD-10-CM | POA: Insufficient documentation

## 2018-06-24 DIAGNOSIS — F1910 Other psychoactive substance abuse, uncomplicated: Secondary | ICD-10-CM | POA: Diagnosis present

## 2018-06-24 DIAGNOSIS — J45909 Unspecified asthma, uncomplicated: Secondary | ICD-10-CM | POA: Insufficient documentation

## 2018-06-24 DIAGNOSIS — F250 Schizoaffective disorder, bipolar type: Secondary | ICD-10-CM | POA: Diagnosis present

## 2018-06-24 LAB — RAPID URINE DRUG SCREEN, HOSP PERFORMED
Amphetamines: NOT DETECTED
BARBITURATES: NOT DETECTED
Benzodiazepines: NOT DETECTED
Cocaine: NOT DETECTED
Opiates: NOT DETECTED
TETRAHYDROCANNABINOL: POSITIVE — AB

## 2018-06-24 LAB — CBC WITH DIFFERENTIAL/PLATELET
BASOS ABS: 0 10*3/uL (ref 0.0–0.1)
BASOS PCT: 0 %
Eosinophils Absolute: 0.2 10*3/uL (ref 0.0–0.7)
Eosinophils Relative: 2 %
HEMATOCRIT: 32.7 % — AB (ref 39.0–52.0)
Hemoglobin: 10.4 g/dL — ABNORMAL LOW (ref 13.0–17.0)
Lymphocytes Relative: 18 %
Lymphs Abs: 1.7 10*3/uL (ref 0.7–4.0)
MCH: 28.9 pg (ref 26.0–34.0)
MCHC: 31.8 g/dL (ref 30.0–36.0)
MCV: 90.8 fL (ref 78.0–100.0)
MONO ABS: 0.8 10*3/uL (ref 0.1–1.0)
Monocytes Relative: 9 %
NEUTROS ABS: 6.7 10*3/uL (ref 1.7–7.7)
NEUTROS PCT: 71 %
Platelets: 402 10*3/uL — ABNORMAL HIGH (ref 150–400)
RBC: 3.6 MIL/uL — ABNORMAL LOW (ref 4.22–5.81)
RDW: 12.7 % (ref 11.5–15.5)
WBC: 9.4 10*3/uL (ref 4.0–10.5)

## 2018-06-24 LAB — COMPREHENSIVE METABOLIC PANEL
ALBUMIN: 4 g/dL (ref 3.5–5.0)
ALK PHOS: 75 U/L (ref 38–126)
ALT: 86 U/L — AB (ref 0–44)
AST: 119 U/L — ABNORMAL HIGH (ref 15–41)
Anion gap: 10 (ref 5–15)
BUN: 16 mg/dL (ref 6–20)
CALCIUM: 9.1 mg/dL (ref 8.9–10.3)
CO2: 24 mmol/L (ref 22–32)
CREATININE: 1.1 mg/dL (ref 0.61–1.24)
Chloride: 110 mmol/L (ref 98–111)
GFR calc Af Amer: >60 mL/min (ref >60)
GFR calc non Af Amer: >60 mL/min (ref >60)
GLUCOSE: 79 mg/dL (ref 70–99)
Potassium: 4.1 mmol/L (ref 3.5–5.1)
Sodium: 144 mmol/L (ref 135–145)
Total Bilirubin: 0.6 mg/dL (ref 0.3–1.2)
Total Protein: 6.8 g/dL (ref 6.5–8.1)

## 2018-06-24 LAB — ETHANOL: Alcohol, Ethyl (B): <10 mg/dL (ref <10)

## 2018-06-24 MED ORDER — LORAZEPAM 1 MG PO TABS: 0.0000 mg | ORAL_TABLET | Freq: Two times a day (BID) | ORAL | Status: DC

## 2018-06-24 MED ORDER — LORAZEPAM 2 MG/ML IJ SOLN
2.0000 mg | Freq: Once | INTRAMUSCULAR | Status: AC
  Administered 2018-06-24: 2 mg via INTRAMUSCULAR
  Filled 2018-06-24: qty 1

## 2018-06-24 MED ORDER — LORAZEPAM 1 MG PO TABS: 0.0000 mg | ORAL_TABLET | Freq: Four times a day (QID) | ORAL | Status: DC

## 2018-06-24 MED ORDER — DIPHENHYDRAMINE HCL 50 MG/ML IJ SOLN
50.0000 mg | Freq: Once | INTRAMUSCULAR | Status: DC
  Filled 2018-06-24: qty 1

## 2018-06-24 MED ORDER — STERILE WATER FOR INJECTION IJ SOLN
INTRAMUSCULAR | Status: AC
  Administered 2018-06-24: 1.2 mL
  Filled 2018-06-24: qty 10

## 2018-06-24 MED ORDER — LORAZEPAM 2 MG/ML IJ SOLN: 0.0000 mg | Freq: Two times a day (BID) | INTRAMUSCULAR | Status: DC

## 2018-06-24 MED ORDER — ZIPRASIDONE MESYLATE 20 MG IM SOLR
20.0000 mg | Freq: Once | INTRAMUSCULAR | Status: AC
  Administered 2018-06-24: 20 mg via INTRAMUSCULAR
  Filled 2018-06-24: qty 20

## 2018-06-24 MED ORDER — VITAMIN B-1 100 MG PO TABS: 100.0000 mg | ORAL_TABLET | Freq: Every day | ORAL | Status: DC

## 2018-06-24 MED ORDER — THIAMINE HCL 100 MG/ML IJ SOLN: 100.0000 mg | Freq: Every day | INTRAMUSCULAR | Status: DC

## 2018-06-24 MED ORDER — GABAPENTIN 300 MG PO CAPS
300.0000 mg | ORAL_CAPSULE | Freq: Three times a day (TID) | ORAL | Status: DC
  Administered 2018-06-24: 300 mg via ORAL
  Filled 2018-06-24: qty 1

## 2018-06-24 MED ORDER — DIPHENHYDRAMINE HCL 50 MG/ML IJ SOLN
50.0000 mg | Freq: Once | INTRAMUSCULAR | Status: AC
  Administered 2018-06-24: 50 mg via INTRAMUSCULAR

## 2018-06-24 MED ORDER — HYDROXYZINE HCL 25 MG PO TABS
50.0000 mg | ORAL_TABLET | Freq: Three times a day (TID) | ORAL | Status: DC
  Administered 2018-06-24: 50 mg via ORAL
  Filled 2018-06-24: qty 2

## 2018-06-24 MED ORDER — ASENAPINE MALEATE 5 MG SL SUBL
10.0000 mg | SUBLINGUAL_TABLET | Freq: Two times a day (BID) | SUBLINGUAL | Status: DC
  Administered 2018-06-24: 10 mg via SUBLINGUAL
  Filled 2018-06-24: qty 2

## 2018-06-24 MED ORDER — LORAZEPAM 2 MG/ML IJ SOLN: 0.0000 mg | Freq: Four times a day (QID) | INTRAMUSCULAR | Status: DC

## 2018-06-24 MED ORDER — BENZTROPINE MESYLATE 0.5 MG PO TABS
0.5000 mg | ORAL_TABLET | Freq: Every day | ORAL | Status: DC
  Administered 2018-06-24: 0.5 mg via ORAL
  Filled 2018-06-24: qty 1

## 2018-06-24 MED ORDER — TRAZODONE HCL 50 MG PO TABS
50.0000 mg | ORAL_TABLET | Freq: Every day | ORAL | Status: DC
  Administered 2018-06-24: 50 mg via ORAL
  Filled 2018-06-24: qty 1
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
## 2018-06-24 NOTE — ED Notes (Signed)
Bed: [facility] Expected date:  Expected time:  Means of arrival:  Comments: 
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
## 2018-06-24 NOTE — BH Assessment (Signed)
[facility] Assessment Progress Note Case was staffed with Lord DNP who recommended patient be observed and monitored for safety. Patient will be seen by psychiatry in the a.m.     
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
## 2018-06-24 NOTE — Discharge Instructions (Addendum)
For your mental health needs, you are advised to follow up with [facility].  New and returning patients are seen at their walk-in clinic.  Walk-in hours are Monday - Friday from 8:00 am - 3:00 pm.  Walk-in patients are seen on a first come, first served basis.  Try to arrive as early as possible for he best chance of being seen the same day: ° °     [facility] °     [address] °     [place], [place] [address] °     [phone] °

## 2018-06-24 NOTE — ED Notes (Signed)
Patient calm and cooperative.  He reports he has been hallucinating seeing demons and hearing voices telling him to harm others.  Patient requesting food saying he is very hungry.

## 2018-06-24 NOTE — ED Notes (Signed)
Patient is intrusive and argumentative at this time with his peers. Patient continues to try and pick fights with other patients in the milieu. Patient to be escorted back to his room by [employer]. Encouragement and  Support provided and safety maintain. Q 15 min safety checks and video monitoring remain in place.
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
## 2018-06-24 NOTE — ED Triage Notes (Addendum)
Pt arrived via police. Pt states he wants "to go to the back, get my [place] sandwich and lay down". Pt denies SI, states he wants to hurt others. Pt is not answering questions in triage, states "run my name and send me to the back."

## 2018-06-25 DIAGNOSIS — F17210 Nicotine dependence, cigarettes, uncomplicated: Secondary | ICD-10-CM

## 2018-06-25 DIAGNOSIS — F419 Anxiety disorder, unspecified: Secondary | ICD-10-CM

## 2018-06-25 DIAGNOSIS — F12122 Cannabis abuse with intoxication with perceptual disturbance: Secondary | ICD-10-CM

## 2018-06-25 DIAGNOSIS — F250 Schizoaffective disorder, bipolar type: Secondary | ICD-10-CM

## 2018-06-25 MED ORDER — ZIPRASIDONE MESYLATE 20 MG IM SOLR
10.0000 mg | Freq: Once | INTRAMUSCULAR | Status: AC
  Administered 2018-06-25: 10 mg via INTRAMUSCULAR

## 2018-06-25 MED ORDER — DIPHENHYDRAMINE HCL 50 MG/ML IJ SOLN
50.0000 mg | Freq: Once | INTRAMUSCULAR | Status: AC
  Administered 2018-06-25: 50 mg via INTRAMUSCULAR

## 2018-06-25 MED ORDER — LORAZEPAM 2 MG/ML IJ SOLN
2.0000 mg | Freq: Once | INTRAMUSCULAR | Status: AC
  Administered 2018-06-25: 2 mg via INTRAMUSCULAR

## 2018-06-25 MED ORDER — LORAZEPAM 2 MG/ML IJ SOLN
INTRAMUSCULAR | Status: AC
  Administered 2018-06-25: 2 mg via INTRAMUSCULAR
  Filled 2018-06-25: qty 1

## 2018-06-25 MED ORDER — DIPHENHYDRAMINE HCL 50 MG/ML IJ SOLN
INTRAMUSCULAR | Status: AC
  Administered 2018-06-25: 50 mg via INTRAMUSCULAR
  Filled 2018-06-25: qty 1

## 2018-06-25 MED ORDER — ZIPRASIDONE MESYLATE 20 MG IM SOLR
INTRAMUSCULAR | Status: AC
  Administered 2018-06-25: 10 mg via INTRAMUSCULAR
  Filled 2018-06-25: qty 20

## 2018-06-25 NOTE — ED Notes (Signed)
Patient continues to try and go over to other side of hallway to engage room 42. Staff has to constantly re-direct him back to his side of the hallway.

## 2018-06-25 NOTE — BH Assessment (Signed)
[facility] Assessment Progress Note  Per Jacqueline Norman, DO, this pt does not require psychiatric hospitalization at this time.  Pt is to be discharged from [facility] with recommendation to follow up with [facility].  This has been included in pt's discharge instructions.  Pt's nurse, Ashley, has been notified.  Philip Shelvin, MA Triage Specialist [phone]     

## 2018-06-25 NOTE — ED Notes (Signed)
Provider at bedside speaking with pt.

## 2018-06-25 NOTE — [facility] Suicide Risk Assessment (Cosign Needed)
Suicide Risk Assessment  Discharge Assessment   [facility]   Principal Problem: Schizoaffective disorder, bipolar type [facility](HCC) Discharge Diagnoses:  Patient Active Problem List   Diagnosis Date Noted  . 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 Schizoaffective disorder (HCC) [F25.9] 11/09/2015  . Mood disorder (HCC) [F39]   . Bipolar I disorder with mania (HCC) [F31.10]   . Agitation [R45.1] 09/16/2014  . Polysubstance abuse (HCC) [F19.10] 09/16/2014  . Cannabis use disorder, severe, dependence (HCC) [F12.20]   . Hyperammonemia (HCC) [E72.20] 09/07/2014  . Schizoaffective disorder, bipolar type (HCC) [F25.0]   . Psychotic disorder (HCC) [F29] 09/01/2014    Total Time spent with patient: 20 minutes  Musculoskeletal: Strength & Muscle Tone: within normal limits Gait & Station: normal Patient leans: N/A  Psychiatric Specialty Exam: Review of Systems  Constitutional: Negative.   HENT: Negative.   Eyes: Negative.   Respiratory: Negative.   Cardiovascular: Negative.   Gastrointestinal: Negative.   Genitourinary: Negative.   Musculoskeletal: Negative.   Skin: Negative.   Neurological: Negative.   Endo/Heme/Allergies: Negative.   Psychiatric/Behavioral: The patient is nervous/anxious.   Physical Exam  Constitutional: He is oriented to person, place, and time and well-developed, well-nourished, and in no distress.  Cardiovascular: Normal rate.  Pulmonary/Chest: Effort normal.  Musculoskeletal: Normal range of motion.  Neurological: He is alert and oriented to person, place, and time.  Skin: Skin is warm.  Vitals reviewed.   Blood pressure 123/86, pulse 98, temperature 98.2 F (36.8 C), temperature source Oral, resp. rate 18, SpO2 98 %.There is no height or weight on file to calculate BMI.  General Appearance: Casual  Eye Contact::  Good  Speech:  Clear and Coherent and Normal Rate409  Volume:   Normal  Mood:  Anxious and Irritable  Affect:  Congruent  Thought Process:  Goal Directed and Descriptions of Associations: Intact  Orientation:  Full (Time, Place, and Person)  Thought Content:  WDL  Suicidal Thoughts:  No  Homicidal Thoughts:  No  Memory:  Immediate;   Good Recent;   Good Remote;   Good  Judgement:  Fair  Insight:  Fair  Psychomotor Activity:  Normal  Concentration:  Fair  Recall:  Good  Fund of Knowledge:Good  Language: Good  Akathisia:  No  Handed:  Right  AIMS (if indicated):     Assets:  Communication Skills Desire for Improvement Financial Resources/Insurance Housing Physical Health Social Support  Sleep:     Cognition: WNL  ADL's:  Intact   Mental Status Per Nursing Assessment::   On Admission:    "Psychiatric/Behavioral: Positive for agitation, behavioral problems and homicidal ideas. Negative for suicidal ideas. The patient is hyperactive."    Demographic Factors:  Male and Low socioeconomic status  Loss Factors: Legal issues and Financial problems/change in socioeconomic status  Historical Factors: Impulsivity  Risk Reduction Factors:   Positive social support  Continued Clinical Symptoms:  Schizoaffective  Cognitive Features That Contribute To Risk:  Closed-mindedness and Thought constriction (tunnel vision)    Suicide Risk:  Minimal: No identifiable suicidal ideation.  Patients presenting with no risk factors but with morbid ruminations; may be classified as minimal risk based on the severity of the depressive symptoms    Plan Of Care/Follow-up recommendations:  Other:  discharge home and continue psychiatric medications.  Gerlene Burdockravis B Dymin Dingledine, FNP 06/25/2018, 8:48 AM

## 2018-06-25 NOTE — ED Notes (Signed)
Pt d/c home per MD order. Pt uninterested, verbally aggressive, threatening.Not endorsing SI. Personal property returned. [employer] and security present.
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
## 2018-06-25 NOTE — ED Notes (Addendum)
Pt verbally aggressive, yelling, cussing. Pt demanding to leave. NP made aware . security and [employer] present.

## 2018-06-25 NOTE — ED Notes (Signed)
Pt argumentative, demanding, loud, disturbing the milieu. Refusing to follow simple directions. Special checks q 15 mins in place for safety, Video monitoring in place. Will continue to monitor.

## 2018-11-22 ENCOUNTER — Emergency Department (HOSPITAL_COMMUNITY)
Admission: EM | Admit: 2018-11-22 | Discharge: 2018-11-23 | Disposition: A | Discharge status: Home / Self Care | Payer: Self-pay | Attending: Emergency Medicine | Admitting: Emergency Medicine

## 2018-11-22 ENCOUNTER — Other Ambulatory Visit: Payer: Self-pay

## 2018-11-22 DIAGNOSIS — J45909 Unspecified asthma, uncomplicated: Secondary | ICD-10-CM | POA: Insufficient documentation

## 2018-11-22 DIAGNOSIS — F250 Schizoaffective disorder, bipolar type: Secondary | ICD-10-CM | POA: Insufficient documentation

## 2018-11-22 DIAGNOSIS — F17210 Nicotine dependence, cigarettes, uncomplicated: Secondary | ICD-10-CM | POA: Insufficient documentation

## 2018-11-22 DIAGNOSIS — R4689 Other symptoms and signs involving appearance and behavior: Secondary | ICD-10-CM | POA: Insufficient documentation

## 2018-11-22 DIAGNOSIS — Z79899 Other long term (current) drug therapy: Secondary | ICD-10-CM | POA: Insufficient documentation

## 2018-11-22 LAB — CBC
HEMATOCRIT: 39.3 % (ref 39.0–52.0)
Hemoglobin: 12 g/dL — ABNORMAL LOW (ref 13.0–17.0)
MCH: 27.5 pg (ref 26.0–34.0)
MCHC: 30.5 g/dL (ref 30.0–36.0)
MCV: 90.1 fL (ref 80.0–100.0)
Platelets: 360 10*3/uL (ref 150–400)
RBC: 4.36 MIL/uL (ref 4.22–5.81)
RDW: 14.3 % (ref 11.5–15.5)
WBC: 7.2 10*3/uL (ref 4.0–10.5)
nRBC: 0 % (ref 0.0–0.2)

## 2018-11-22 LAB — RAPID URINE DRUG SCREEN, HOSP PERFORMED
Amphetamines: NOT DETECTED
BARBITURATES: NOT DETECTED
Benzodiazepines: NOT DETECTED
Cocaine: NOT DETECTED
Opiates: NOT DETECTED
Tetrahydrocannabinol: POSITIVE — AB

## 2018-11-22 LAB — COMPREHENSIVE METABOLIC PANEL
ALT: 14 U/L (ref 0–44)
AST: 19 U/L (ref 15–41)
Albumin: 4.5 g/dL (ref 3.5–5.0)
Alkaline Phosphatase: 78 U/L (ref 38–126)
Anion gap: 10 (ref 5–15)
BILIRUBIN TOTAL: 0.6 mg/dL (ref 0.3–1.2)
BUN: 12 mg/dL (ref 6–20)
CALCIUM: 9.4 mg/dL (ref 8.9–10.3)
CO2: 24 mmol/L (ref 22–32)
Chloride: 107 mmol/L (ref 98–111)
Creatinine, Ser: 0.88 mg/dL (ref 0.61–1.24)
GFR calc Af Amer: >60 mL/min (ref >60)
Glucose, Bld: 95 mg/dL (ref 70–99)
Potassium: 3.4 mmol/L — ABNORMAL LOW (ref 3.5–5.1)
Sodium: 141 mmol/L (ref 135–145)
TOTAL PROTEIN: 7.3 g/dL (ref 6.5–8.1)

## 2018-11-22 LAB — SALICYLATE LEVEL: Salicylate Lvl: <7 mg/dL (ref 2.8–30.0)

## 2018-11-22 LAB — ACETAMINOPHEN LEVEL: Acetaminophen (Tylenol), Serum: <10 ug/mL — ABNORMAL LOW (ref 10–30)

## 2018-11-22 LAB — ETHANOL

## 2018-11-22 MED ORDER — STERILE WATER FOR INJECTION IJ SOLN
INTRAMUSCULAR | Status: AC
  Administered 2018-11-22: 10 mL
  Filled 2018-11-22: qty 10

## 2018-11-22 MED ORDER — ZIPRASIDONE MESYLATE 20 MG IM SOLR
INTRAMUSCULAR | Status: AC
  Administered 2018-11-22: 10 mg
  Filled 2018-11-22: qty 20

## 2018-11-22 NOTE — ED Triage Notes (Signed)
11/22/2018  1857  Per police patient states he has not been taking medicine in the last week and a half. Patient was found walking through traffic, clutched fist, cursing, and yelling.

## 2018-11-22 NOTE — Progress Notes (Signed)
11/22/2018  1910  Labs drawn. Dark green top sent to mini lab. Dark green, light green, purple, and gold top sent to main lab.

## 2018-11-22 NOTE — Progress Notes (Signed)
Received Philip [name] from [place] after receiving Geodon IM. He took a shower and received a snack. He is sleeping without distress in his bed.He woke up a couple of times throughout the night and returned to bed shortly thereafter.

## 2018-11-22 NOTE — ED Notes (Signed)
Bed: WA26 Expected date:  Expected time:  Means of arrival:  Comments: [employer] IVC 

## 2018-11-22 NOTE — ED Provider Notes (Signed)
[facility]-EMERGENCY DEPT Provider Note   CSN: [id number] Arrival date & time: 11/22/18  1840     History   Chief Complaint Chief Complaint  Patient presents with  . Delusional    HPI Philip [name] is a 28 y.o. male.  HPI Patient with history of bipolar and schizophrenia.  States he has been out of his medication for more than a month.  States he has had increased agitation and easily becoming angry with very minimal provocation here recently.  States he was walking down the street and felt that he was being harassed by the police.  He then got into a verbal altercation.  Was brought in by [employer].  He denies any suicidal ideations. Past Medical History:  Diagnosis Date  . ADHD (attention deficit hyperactivity disorder)   . Asthma   . Bipolar affective disorder ([facility])   . Schizophrenia [facility])     Patient Active Problem List   Diagnosis Date Noted  . 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 Schizoaffective disorder ([facility]) 11/09/2015  . Mood disorder ([facility])   . Bipolar I disorder with mania ([facility])   . Agitation 09/16/2014  . Polysubstance abuse ([facility]) 09/16/2014  . Cannabis use disorder, severe, dependence ([facility])   . Hyperammonemia ([facility]) 09/07/2014  . Schizoaffective disorder, bipolar type ([facility])   . 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 NO PAST SURGERIES          Home Medications    Prior to Admission medications   Medication Sig Start Date End Date Taking? Authorizing Provider  asenapine (SAPHRIS) 5 MG SUBL 24 hr tablet Place 2 tablets (10 mg total) under the tongue 2 (two) times daily. Patient not taking: Reported on 11/22/2018 06/21/18   Cherly Beach, DO  benztropine (COGENTIN) 0.5 MG tablet Take 1 tablet (0.5 mg total) by mouth at bedtime. Patient not taking: Reported on 11/22/2018 06/21/18   Cherly Beach, DO  hydrOXYzine (ATARAX/VISTARIL)  50 MG tablet Take 1 tablet (50 mg total) by mouth 3 (three) times daily. 06/21/18   Cherly Beach, DO  lamoTRIgine (LAMICTAL) 200 MG tablet Take 200 mg by mouth every evening. 09/09/18   [provider]  mirtazapine (REMERON) 15 MG tablet TAKE ONE-HALF TABLET BY MOUTH IN THE EVENING 09/09/18   [provider]  OLANZapine (ZYPREXA) 20 MG tablet Take 20 mg by mouth every evening. 09/09/18   [provider]  Promethazine HCl (PHENERGAN PO) Take 1 tablet by mouth daily as needed (nausea and vomiting).    [provider]  traZODone (DESYREL) 50 MG tablet Take 1 tablet (50 mg total) by mouth at bedtime. Patient not taking: Reported on 11/22/2018 06/21/18   Cherly Beach, DO    Family History Family History  Problem Relation Age of Onset  . Hypertension Mother     Social History Social History   Tobacco Use  . Smoking status: Current Every Day Smoker    Packs/day: 1.00    Years: 6.00    Pack years: 6.00    Types: Cigarettes  . Tobacco comment: unable to assess  Substance Use Topics  . Alcohol use: Yes    Alcohol/week: 1.0 standard drinks    Types: 1 Cans of beer per week    Comment: unable to assess  . Drug use: Yes    Types: Marijuana    Comment: unable to assess  Allergies   Shrimp [shellfish allergy] and Pork-derived products   Review of Systems Review of Systems  Constitutional: Negative for chills and fever.  HENT: Negative for sore throat and trouble swallowing.   Eyes: Negative for visual disturbance.  Respiratory: Negative for cough and shortness of breath.   Cardiovascular: Negative for chest pain.  Gastrointestinal: Negative for abdominal pain, constipation, diarrhea, nausea and vomiting.  Genitourinary: Negative for dysuria, flank pain and frequency.  Musculoskeletal: Negative for back pain and neck pain.  Skin: Negative for rash and wound.  Neurological: Negative for dizziness, weakness, light-headedness,  numbness and headaches.  Psychiatric/Behavioral: Positive for agitation. Negative for suicidal ideas. The patient is not nervous/anxious.   All other systems reviewed and are negative.    Physical Exam Updated Vital Signs BP 136/89 (BP Location: Right Arm)   Pulse 86   Temp 98.4 F (36.9 C) (Oral)   Resp 16   SpO2 98%   Physical Exam Vitals signs and nursing note reviewed.  Constitutional:      Appearance: Normal appearance. He is well-developed.  HENT:     Head: Normocephalic and atraumatic.  Eyes:     Pupils: Pupils are equal, round, and reactive to light.  Neck:     Musculoskeletal: Normal range of motion and neck supple.  Cardiovascular:     Rate and Rhythm: Normal rate and regular rhythm.  Pulmonary:     Effort: Pulmonary effort is normal.     Breath sounds: Normal breath sounds.  Abdominal:     General: Bowel sounds are normal.     Palpations: Abdomen is soft.     Tenderness: There is no abdominal tenderness. There is no guarding or rebound.  Musculoskeletal: Normal range of motion.        General: No tenderness.  Skin:    General: Skin is warm and dry.     Findings: No erythema or rash.  Neurological:     General: No focal deficit present.     Mental Status: He is alert and oriented to person, place, and time.  Psychiatric:     Comments: Mildly pressured speech and hyperactive.  Does not appear to be at risk responding to internal stimuli.  No SI.      ED Treatments / Results  Labs (all labs ordered are listed, but only abnormal results are displayed) Labs Reviewed  COMPREHENSIVE METABOLIC PANEL - Abnormal; Notable for the following components:      Result Value   Potassium 3.4 (*)    All other components within normal limits  ACETAMINOPHEN LEVEL - Abnormal; Notable for the following components:   Acetaminophen (Tylenol), Serum <10 (*)    All other components within normal limits  CBC - Abnormal; Notable for the following components:   Hemoglobin  12.0 (*)    All other components within normal limits  RAPID URINE DRUG SCREEN, HOSP PERFORMED - Abnormal; Notable for the following components:   Tetrahydrocannabinol POSITIVE (*)    All other components within normal limits  ETHANOL  SALICYLATE LEVEL    EKG None  Radiology No results found.  Procedures Procedures (including critical care time)  Medications Ordered in ED Medications  ziprasidone (GEODON) 20 MG injection (10 mg  Given 11/22/18 2203)  sterile water (preservative free) injection (10 mLs  Given 11/22/18 2203)     Initial Impression / Assessment and Plan / ED Course  I have reviewed the triage vital signs and the nursing notes.  Pertinent labs & imaging results that were  available during my care of the patient were reviewed by me and considered in my medical decision making (see chart for details).     Medically cleared for psychiatric evaluation.  Patient becoming increasingly agitated needing IM sedation. Seen by TTS and recommend inpatient treatment.  Final Clinical Impressions(s) / ED Diagnoses   Final diagnoses:  Behavior concern    ED Discharge Orders    None       Loren Racer, MD 11/22/18 2223

## 2018-11-22 NOTE — Progress Notes (Signed)
Per Nira Conn, NP pt meets criteria for inpt treatment. EDP Loren Racer, MD and pt's nurse have been advised. TTS to seek placement. No 500 hall beds at [facility] per [facility].  Princess Bruins, MSW, LCSW Therapeutic Triage Specialist  [phone]

## 2018-11-22 NOTE — ED Notes (Signed)
Attempted to call report [facility] unable to take at this time.

## 2018-11-22 NOTE — ED Triage Notes (Signed)
Patient states he wants to kill everyone in the world.
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
## 2018-11-23 DIAGNOSIS — F250 Schizoaffective disorder, bipolar type: Secondary | ICD-10-CM

## 2018-11-23 MED ORDER — LAMOTRIGINE 200 MG PO TABS: 200.0000 mg | ORAL_TABLET | Freq: Every evening | ORAL | 0 refills | Status: DC

## 2018-11-23 MED ORDER — MIRTAZAPINE 15 MG PO TABS: 15.0000 mg | ORAL_TABLET | Freq: Every day | ORAL | 0 refills | Status: DC

## 2018-11-23 MED ORDER — OLANZAPINE 20 MG PO TABS: 20.0000 mg | ORAL_TABLET | Freq: Every evening | ORAL | 0 refills | Status: DC

## 2018-11-23 MED ORDER — HYDROXYZINE HCL 50 MG PO TABS: 50.0000 mg | ORAL_TABLET | Freq: Three times a day (TID) | ORAL | 0 refills | Status: DC

## 2018-11-23 MED ORDER — DIPHENHYDRAMINE HCL 50 MG/ML IJ SOLN
50.0000 mg | Freq: Once | INTRAMUSCULAR | Status: AC
  Administered 2018-11-23: 50 mg via INTRAMUSCULAR
  Filled 2018-11-23: qty 1

## 2018-11-23 NOTE — ED Notes (Signed)
Philip Nottingham DNP updated, order recieved

## 2018-11-23 NOTE — ED Notes (Addendum)
Sitting quietly in room,calm, medicated w/o difficulty, encouraged to stay in his room. Smiling, cooperative.

## 2018-11-23 NOTE — Consult Note (Addendum)
[facility]  11/23/2018 9:27 AM Raymondo BandRodney D Bakke  MRN:  [id number] Principal Problem: Schizoaffective disorder, bipolar type [facility](HCC) Discharge Diagnoses: Principal Problem:   Schizoaffective disorder, bipolar type [facility](HCC)  Subjective: 28 yo male who presented to the ED with hypomania and thoughts to kill people.  He is well known to this ED with typically substance abuse but none today.  Medications were restarted and he stabilized to his baseline, grandiose and energetic.  No suicidal/homicidal ideations, hallucinations, or substance abuse.  His behaviors/mania increases without his medications and worsens with drug use.  No mania today. Stable for discharge.  Total Time spent with patient: 45 minutes  Past Psychiatric History: substance abuse, schizoaffective disorder, ADHD  Past Medical History:  Past Medical History:  Diagnosis Date  . ADHD (attention deficit hyperactivity disorder)   . Asthma   . Bipolar affective disorder (HCC)   . Schizophrenia [facility](HCC)     Past Surgical History:  Procedure Laterality Date  . NO PAST SURGERIES     Family History:  Family History  Problem Relation Age of Onset  . Hypertension Mother    Family Psychiatric  History: none Social History:  Social History   Substance and Sexual Activity  Alcohol Use Yes  . Alcohol/week: 1.0 standard drinks  . Types: 1 Cans of beer per week   Comment: unable to assess     Social History   Substance and Sexual Activity  Drug Use Yes  . Types: Marijuana   Comment: unable to assess    Social History   Socioeconomic History  . Marital status: Married    Spouse name: Not on file  . Number of children: Not on file  . Years of education: Not on file  . Highest education level: Not on file  Occupational History  . Not on file  Social Needs  . Financial resource strain: Not on file  . Food insecurity:    Worry: Not on file    Inability: Not on file  . Transportation needs:    Medical: Not on  file    Non-medical: Not on file  Tobacco Use  . Smoking status: Current Every Day Smoker    Packs/day: 1.00    Years: 6.00    Pack years: 6.00    Types: Cigarettes  . Tobacco comment: unable to assess  Substance and Sexual Activity  . Alcohol use: Yes    Alcohol/week: 1.0 standard drinks    Types: 1 Cans of beer per week    Comment: unable to assess  . Drug use: Yes    Types: Marijuana    Comment: unable to assess  . Sexual activity: Yes    Comment: unable to access  Lifestyle  . Physical activity:    Days per week: Not on file    Minutes per session: Not on file  . Stress: Not on file  Relationships  . Social connections:    Talks on phone: Not on file    Gets together: Not on file    Attends religious service: Not on file    Active member of club or organization: Not on file    Attends meetings of clubs or organizations: Not on file    Relationship status: Not on file  Other Topics Concern  . Not on file  Social History Narrative  . Not on file    Has this patient used any form of tobacco in the last 30 days? (Cigarettes, Smokeless Tobacco, Cigars, and/or Pipes) NA  Current Medications: No current facility-administered medications for this encounter.    Current Outpatient Medications  Medication Sig Dispense Refill  . asenapine (SAPHRIS) 5 MG SUBL 24 hr tablet Place 2 tablets (10 mg total) under the tongue 2 (two) times daily. (Patient not taking: Reported on 11/22/2018) 14 tablet 0  . benztropine (COGENTIN) 0.5 MG tablet Take 1 tablet (0.5 mg total) by mouth at bedtime. (Patient not taking: Reported on 11/22/2018) 7 tablet 0  . hydrOXYzine (ATARAX/VISTARIL) 50 MG tablet Take 1 tablet (50 mg total) by mouth 3 (three) times daily. 21 tablet 0  . lamoTRIgine (LAMICTAL) 200 MG tablet Take 200 mg by mouth every evening.    . mirtazapine (REMERON) 15 MG tablet TAKE ONE-HALF TABLET BY MOUTH IN THE EVENING    . OLANZapine (ZYPREXA) 20 MG tablet Take 20 mg by mouth every  evening.    . Promethazine HCl (PHENERGAN PO) Take 1 tablet by mouth daily as needed (nausea and vomiting).    . traZODone (DESYREL) 50 MG tablet Take 1 tablet (50 mg total) by mouth at bedtime. (Patient not taking: Reported on 11/22/2018) 14 tablet 0   PTA Medications: (Not in a hospital admission)   Musculoskeletal: Strength & Muscle Tone: within normal limits Gait & Station: normal Patient leans: N/A  Psychiatric Specialty Exam: Physical Exam  Nursing note and vitals reviewed. Constitutional: He is oriented to person, place, and time. He appears well-developed and well-nourished.  HENT:  Head: Normocephalic.  Neck: Normal range of motion.  Cardiovascular: Normal rate.  Respiratory: Effort normal.  Musculoskeletal: Normal range of motion.  Neurological: He is alert and oriented to person, place, and time.  Psychiatric: His speech is normal and behavior is normal. Judgment and thought content normal. His mood appears anxious. His affect is blunt. Cognition and memory are normal.    Review of Systems  Psychiatric/Behavioral: The patient is nervous/anxious.   All other systems reviewed and are negative.   Blood pressure 126/84, pulse 62, temperature 97.9 F (36.6 C), temperature source Oral, resp. rate 17, SpO2 100 %.There is no height or weight on file to calculate BMI.  General Appearance: Casual  Eye Contact:  Good  Speech:  Normal Rate  Volume:  Normal  Mood:  Anxious  Affect:  Blunt  Thought Process:  Coherent and Descriptions of Associations: Intact  Orientation:  Full (Time, Place, and Person)  Thought Content:  WDL and Logical  Suicidal Thoughts:  No  Homicidal Thoughts:  No  Memory:  Immediate;   Good Recent;   Good Remote;   Good  Judgement:  Fair  Insight:  Fair  Psychomotor Activity:  Normal  Concentration:  Concentration: Good and Attention Span: Good  Recall:  Good  Fund of Knowledge:  Fair  Language:  Good  Akathisia:  No  Handed:  Right  AIMS (if  indicated):     Assets:  Housing Leisure Time Physical Health Resilience Social Support  ADL's:  Intact  Cognition:  WNL  Sleep:        Demographic Factors:  Male and Adolescent or young adult  Loss Factors: NA  Historical Factors: NA  Risk Reduction Factors:   Sense of responsibility to family, Living with another person, especially a relative and Positive social support  Continued Clinical Symptoms:  Anxiety, mild  Cognitive Features That Contribute To Risk:  None    Suicide Risk:  Minimal: No identifiable suicidal ideation.  Patients presenting with no risk factors but with morbid ruminations; may be classified  as minimal risk based on the severity of the depressive symptoms   Plan Of Care/Follow-up recommendations:  Schizoaffective disorder, bipolar type: -Restarted Zyprexa 20 mg daily -Restarted Lamictal 200 mg daily  Anxiety: -Restarted hydroxyzine 50 mg TID PRN  Insomnia: -restarted Remeron 15 mg at bedtime -Rx provided Activity:  as tolerated Diet:  heart healthy diet  Disposition: discharge home Nanine MeansLORD, JAMISON, NP 11/23/2018, 9:27 AM  Patient seen face-to-face for psychiatric evaluation, chart reviewed and case discussed with the physician extender and developed treatment plan. Reviewed the information documented and agree with the treatment plan. Thedore MinsMojeed Hollyn Stucky, MD

## 2018-11-23 NOTE — ED Notes (Addendum)
Pt up in hall, pleasant, talkative.  Pt denies SI, but reports HI.  Pt reports that he was walking in traffic with his head phones on and that the polcie kept following him.  Pt denies that he was trying to harm his self, but reports that he was trying to "kill other people."  Pt reports that he was just released from jail for 60 days, and that his mother put him in jail.

## 2018-11-23 NOTE — ED Notes (Addendum)
Pt is aware that he is being dc'd.  Pt Up in hall yelling/cursing at other pt and staff wanting to leave. PT yelling and Threatening to harm this Clinical research associate and staff on dc.  Pt given written dc instructions and prescriptions, pt declined to review information, but did take information with him. Dr Mervyn Skeeters and Catha Nottingham DNP present and observed pt, no change is dc. Pt yelling and threatening this Clinical research associate and staff as he left.  security here , Pt ambulatory w/o difficulty, escorted  out assisted by security/ [employer].  Belongings returned after leaving the area.

## 2018-11-23 NOTE — ED Notes (Signed)
Up in hall talking to other pt, calm, pt reports constipation has resolved.

## 2018-11-23 NOTE — ED Notes (Addendum)
Dr Lenetta Quaker and Catha Nottingham DNP into see.  Pt in room, pleasant, smiling, denies HI at this time, pt aware he is going to be dc'd.

## 2018-11-23 NOTE — ED Notes (Addendum)
Pt up in hall, began to yell at staff and other pt when other pt came out of her room.  Security present and assisted in redirecting pt back to room.  Pt continued to yell and threaten staff.

## 2018-11-24 ENCOUNTER — Emergency Department (HOSPITAL_COMMUNITY)
Admission: EM | Admit: 2018-11-24 | Discharge: 2018-11-26 | Disposition: A | Discharge status: Home / Self Care | Payer: Self-pay | Attending: Emergency Medicine | Admitting: Emergency Medicine

## 2018-11-24 ENCOUNTER — Encounter (HOSPITAL_COMMUNITY): Payer: Self-pay | Admitting: Emergency Medicine

## 2018-11-24 ENCOUNTER — Other Ambulatory Visit: Payer: Self-pay

## 2018-11-24 DIAGNOSIS — J45909 Unspecified asthma, uncomplicated: Secondary | ICD-10-CM | POA: Insufficient documentation

## 2018-11-24 DIAGNOSIS — F909 Attention-deficit hyperactivity disorder, unspecified type: Secondary | ICD-10-CM | POA: Insufficient documentation

## 2018-11-24 DIAGNOSIS — F319 Bipolar disorder, unspecified: Secondary | ICD-10-CM | POA: Insufficient documentation

## 2018-11-24 DIAGNOSIS — Z046 Encounter for general psychiatric examination, requested by authority: Secondary | ICD-10-CM

## 2018-11-24 DIAGNOSIS — F17210 Nicotine dependence, cigarettes, uncomplicated: Secondary | ICD-10-CM | POA: Insufficient documentation

## 2018-11-24 DIAGNOSIS — R45851 Suicidal ideations: Secondary | ICD-10-CM | POA: Insufficient documentation

## 2018-11-24 DIAGNOSIS — F209 Schizophrenia, unspecified: Secondary | ICD-10-CM | POA: Insufficient documentation
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
## 2018-11-25 ENCOUNTER — Other Ambulatory Visit: Payer: Self-pay

## 2018-11-25 LAB — RAPID URINE DRUG SCREEN, HOSP PERFORMED
AMPHETAMINES: NOT DETECTED
Barbiturates: NOT DETECTED
Benzodiazepines: NOT DETECTED
Cocaine: NOT DETECTED
Opiates: NOT DETECTED
Tetrahydrocannabinol: POSITIVE — AB

## 2018-11-25 LAB — COMPREHENSIVE METABOLIC PANEL
ALK PHOS: 80 U/L (ref 38–126)
ALT: 18 U/L (ref 0–44)
AST: 26 U/L (ref 15–41)
Albumin: 4.5 g/dL (ref 3.5–5.0)
Anion gap: 12 (ref 5–15)
BUN: 13 mg/dL (ref 6–20)
CALCIUM: 10.5 mg/dL — AB (ref 8.9–10.3)
CO2: 27 mmol/L (ref 22–32)
Chloride: 99 mmol/L (ref 98–111)
Creatinine, Ser: 1.12 mg/dL (ref 0.61–1.24)
GFR calc Af Amer: >60 mL/min (ref >60)
GFR calc non Af Amer: >60 mL/min (ref >60)
Glucose, Bld: 100 mg/dL — ABNORMAL HIGH (ref 70–99)
Potassium: 4.1 mmol/L (ref 3.5–5.1)
Sodium: 138 mmol/L (ref 135–145)
TOTAL PROTEIN: 7.5 g/dL (ref 6.5–8.1)
Total Bilirubin: 0.8 mg/dL (ref 0.3–1.2)

## 2018-11-25 LAB — CBC
HCT: 38.7 % — ABNORMAL LOW (ref 39.0–52.0)
Hemoglobin: 12.4 g/dL — ABNORMAL LOW (ref 13.0–17.0)
MCH: 27.9 pg (ref 26.0–34.0)
MCHC: 32 g/dL (ref 30.0–36.0)
MCV: 87 fL (ref 80.0–100.0)
Platelets: 366 10*3/uL (ref 150–400)
RBC: 4.45 MIL/uL (ref 4.22–5.81)
RDW: 14.4 % (ref 11.5–15.5)
WBC: 6.8 10*3/uL (ref 4.0–10.5)
nRBC: 0 % (ref 0.0–0.2)

## 2018-11-25 LAB — ETHANOL: Alcohol, Ethyl (B): <10 mg/dL (ref <10)

## 2018-11-25 LAB — ACETAMINOPHEN LEVEL: Acetaminophen (Tylenol), Serum: <10 ug/mL — ABNORMAL LOW (ref 10–30)

## 2018-11-25 LAB — SALICYLATE LEVEL: Salicylate Lvl: <7 mg/dL (ref 2.8–30.0)

## 2018-11-25 MED ORDER — ZIPRASIDONE MESYLATE 20 MG IM SOLR
20.0000 mg | Freq: Once | INTRAMUSCULAR | Status: AC
  Administered 2018-11-25: 20 mg via INTRAMUSCULAR
  Filled 2018-11-25: qty 20

## 2018-11-25 MED ORDER — MIRTAZAPINE 15 MG PO TABS
15.0000 mg | ORAL_TABLET | Freq: Every day | ORAL | Status: DC
  Administered 2018-11-25: 15 mg via ORAL
  Filled 2018-11-25 (×2): qty 1

## 2018-11-25 MED ORDER — LORAZEPAM 2 MG/ML IJ SOLN
2.0000 mg | Freq: Once | INTRAMUSCULAR | Status: AC
  Administered 2018-11-25: 2 mg via INTRAMUSCULAR
  Filled 2018-11-25: qty 1

## 2018-11-25 MED ORDER — DIPHENHYDRAMINE HCL 50 MG/ML IJ SOLN
25.0000 mg | Freq: Once | INTRAMUSCULAR | Status: AC
  Administered 2018-11-25: 25 mg via INTRAVENOUS
  Filled 2018-11-25: qty 1

## 2018-11-25 MED ORDER — HYDROXYZINE HCL 50 MG PO TABS
50.0000 mg | ORAL_TABLET | Freq: Three times a day (TID) | ORAL | Status: DC
  Administered 2018-11-25 – 2018-11-26 (×4): 50 mg via ORAL
  Filled 2018-11-25 (×4): qty 1

## 2018-11-25 MED ORDER — LAMOTRIGINE 100 MG PO TABS
200.0000 mg | ORAL_TABLET | Freq: Every evening | ORAL | Status: DC
  Administered 2018-11-25: 200 mg via ORAL
  Filled 2018-11-25: qty 2

## 2018-11-25 MED ORDER — OLANZAPINE 5 MG PO TABS: 20.0000 mg | ORAL_TABLET | Freq: Every evening | ORAL | Status: DC

## 2018-11-25 MED ORDER — OLANZAPINE 5 MG PO TABS
10.0000 mg | ORAL_TABLET | Freq: Two times a day (BID) | ORAL | Status: DC
  Administered 2018-11-25 – 2018-11-26 (×2): 10 mg via ORAL
  Filled 2018-11-25 (×2): qty 2

## 2018-11-25 NOTE — ED Notes (Signed)
Patient wearing maroon scrubs , wanded by security , personal belongings inventoried ( 1 bag) and stored at locker#2, sitter requested for pt ,cooperative at this moment ,  no distress at this time / respirations unlabored , plan of care explained to pt.

## 2018-11-25 NOTE — ED Notes (Signed)
Breakfast tray ordered 

## 2018-11-25 NOTE — ED Triage Notes (Signed)
Pt observed to be walking from his room to BR . On return to his room Pt stood at the door of his room posturing with arms stiff at his side. Pt not talking.

## 2018-11-25 NOTE — ED Notes (Signed)
Patient denies pain and is resting comfortably.  
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
## 2018-11-25 NOTE — ED Notes (Signed)
Pt awake yelling in room. Asked pt what he is yelling about. Pt states "having a dream, the gangs, we got this, we gonna shoot them all". Asked pt to try to be a little more quiet because other people are sleeping.

## 2018-11-25 NOTE — ED Provider Notes (Signed)
TIME SEEN: 12:43 AM  CHIEF COMPLAINT: IVC  HPI: Patient is a 28 year old male with history of schizophrenia, bipolar disorder who presents to the emergency department with [facility] Department under involuntary commitment.  Please report that patient was endorsing suicidal thoughts and expressing homicidal comments towards police officers.  They report that he was holding a knife.  They state that he seemed paranoid and told him that people were out to get him.  They state that he was looking through a shed trying to find his cousin who he says is being held there.  He does admit this to me as well that his cousin has been kept in the shed by his family.  He denies SI, HI or hallucinations.  He reports he has been taking his medications.  Denies drug or alcohol use.  No medical complaints.  States that he just got out of [place] yesterday and has court in the morning.  ROS: See HPI Constitutional: no fever  Eyes: no drainage  ENT: no runny nose   Cardiovascular:  no chest pain  Resp: no SOB  GI: no vomiting GU: no dysuria Integumentary: no rash  Allergy: no hives  Musculoskeletal: no leg swelling  Neurological: no slurred speech ROS otherwise negative  PAST MEDICAL HISTORY/PAST SURGICAL HISTORY:  Past Medical History:  Diagnosis Date  . ADHD (attention deficit hyperactivity disorder)   . Asthma   . Bipolar affective disorder (HCC)   . Schizophrenia (HCC)     MEDICATIONS:  Prior to Admission medications   Medication Sig Start Date End Date Taking? Authorizing Provider  hydrOXYzine (ATARAX/VISTARIL) 50 MG tablet Take 1 tablet (50 mg total) by mouth 3 (three) times daily. 11/23/18   Charm Rings, NP  lamoTRIgine (LAMICTAL) 200 MG tablet Take 1 tablet (200 mg total) by mouth every evening. 11/23/18   Charm Rings, NP  mirtazapine (REMERON) 15 MG tablet Take 1 tablet (15 mg total) by mouth at bedtime. 11/23/18   Charm Rings, NP  OLANZapine (ZYPREXA) 20 MG tablet Take 1  tablet (20 mg total) by mouth every evening. 11/23/18   Charm Rings, NP    ALLERGIES:  Allergies  Allergen Reactions  . Shrimp [Shellfish Allergy] Anaphylaxis  . Pork-Derived Products     Does not eat it    SOCIAL HISTORY:  Social History   Tobacco Use  . Smoking status: Current Every Day Smoker    Packs/day: 1.00    Years: 6.00    Pack years: 6.00    Types: Cigarettes  . Smokeless tobacco: Never Used  . Tobacco comment: unable to assess  Substance Use Topics  . Alcohol use: Yes    Alcohol/week: 1.0 standard drinks    Types: 1 Cans of beer per week    FAMILY HISTORY: Family History  Problem Relation Age of Onset  . Hypertension Mother     EXAM: BP (!) 133/92 (BP Location: Right Arm)   Pulse 79   Temp 98.1 F (36.7 C) (Oral)   Resp 16   SpO2 100%  CONSTITUTIONAL: Alert and oriented and responds appropriately to questions. Well-appearing; well-nourished HEAD: Normocephalic EYES: Conjunctivae clear, pupils appear equal, EOMI ENT: normal nose; moist mucous membranes NECK: Supple, no meningismus, no nuchal rigidity, no LAD  CARD: RRR; S1 and S2 appreciated; no murmurs, no clicks, no rubs, no gallops RESP: Normal chest excursion without splinting or tachypnea; breath sounds clear and equal bilaterally; no wheezes, no rhonchi, no rales, no hypoxia or respiratory distress, speaking  full sentences ABD/GI: Normal bowel sounds; non-distended; soft, non-tender, no rebound, no guarding, no peritoneal signs, no hepatosplenomegaly BACK:  The back appears normal and is non-tender to palpation, there is no CVA tenderness EXT: Normal ROM in all joints; non-tender to palpation; no edema; normal capillary refill; no cyanosis, no calf tenderness or swelling    SKIN: Normal color for age and race; warm; no rash NEURO: Moves all extremities equally PSYCH: Patient has poor insight.  He has tangential thought process and rapid speech.  He has been threatening towards nursing staff.   Denies SI.  MEDICAL DECISION MAKING: Patient here under IVC.  He has been threatening towards our staff in the emergency department but currently calms down with redirection in the presence of security.  I will uphold his IVC papers at this time as I feel he is a danger to himself and others.  Screening labs, urine unremarkable other than drug screen being positive for THC.  TTS consult pending.  ED PROGRESS: TTS has evaluated patient.  They would like to reevaluate patient in the morning.   I reviewed all nursing notes, vitals, pertinent previous records, EKGs, lab and urine results, imaging (as available).      Ward, Layla Maw, DO 11/25/18 [phone]

## 2018-11-25 NOTE — ED Provider Notes (Signed)
  Physical Exam  BP 132/85 (BP Location: Right Arm)   Pulse 87   Temp 98.1 F (36.7 C) (Oral)   Resp 17   SpO2 100%   Physical Exam Vitals signs and nursing note reviewed. Exam conducted with a chaperone present.  Constitutional:      General: He is not in acute distress.    Appearance: Normal appearance.  Neurological:     Mental Status: He is alert.  Psychiatric:        Mood and Affect: Mood normal.     Comments: Sitting in bed talking, laughing with sitter. No obvious distress.     ED Course/Procedures     Procedures  MDM   0830: Pt is IVC'd by Dr Elesa Massed.  Per notes, he woke up and started yelling but redirectable and cooperative since. Discussed pt with RN Magda Paganini. Pending psych eval for disposition. Labs reviewed and unremarkable. VSS. Will need re-evaluation if psych clears him due to previous threats to hurt others.       Liberty Handy, PA-C 11/25/18 0830    Alvira Monday, MD 11/26/18 2156

## 2018-11-25 NOTE — ED Notes (Signed)
Patient is calm but continues to stand in door way with blanket covering his head-Monique,RN
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
## 2018-11-25 NOTE — ED Notes (Signed)
Regular dinner meal tray ordered
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
## 2018-11-25 NOTE — ED Triage Notes (Signed)
Pt now awake and posturing at door way but returned to his bed.
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
## 2018-11-25 NOTE — ED Triage Notes (Signed)
Pt became more agitated after a Presenter, [employer] Security Officer escorted the Tribune CompanyFamily Member of another Purple Zone PT to Area. Pt then was observed standing at BR door way with arms stiffly at his side. Pt had wrapped his hospital socks around his hands and hands were in a fist. Pt then started walking toward unsecured door leading to Green zone. Pt stood at window . Pt was talking in a low voice unable to hear words. Pt was redirected to his room.

## 2018-11-25 NOTE — Progress Notes (Signed)
Nurse, Selena Lesser informed of pt disposition.
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
## 2018-11-25 NOTE — ED Triage Notes (Signed)
Molli Knock ,NP  Arrived to eval Pt.. As Lord was entering Pt room The Pt was observed to be Physically violent pulled  away from Security and [employer]  And went toward Molli Knock ,NP.

## 2018-11-25 NOTE — ED Notes (Addendum)
IVC papers faxed to [facility]. 3 copies on pts chart. Original in red folder.

## 2018-11-25 NOTE — Progress Notes (Signed)
Patient's at his baseline:  Grandiose with many stories.  He had court today and was wanting to miss it per his admission to [facility] ED last week.  Dr Jannifer Franklin and Catha Nottingham know him well and these are his typical behaviors:  Hyperactive and grandiose and exaggerative.   Nanine Means, PMHNP
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
## 2018-11-26 ENCOUNTER — Encounter (HOSPITAL_COMMUNITY): Payer: Self-pay | Admitting: Registered Nurse

## 2018-11-26 MED ORDER — LORAZEPAM 2 MG/ML IJ SOLN
2.0000 mg | Freq: Once | INTRAMUSCULAR | Status: AC
  Administered 2018-11-26: 2 mg via INTRAMUSCULAR
  Filled 2018-11-26: qty 1

## 2018-11-26 MED ORDER — STERILE WATER FOR INJECTION IJ SOLN
INTRAMUSCULAR | Status: AC
  Administered 2018-11-26: 1.2 mL
  Filled 2018-11-26: qty 10

## 2018-11-26 MED ORDER — ZIPRASIDONE MESYLATE 20 MG IM SOLR
20.0000 mg | Freq: Once | INTRAMUSCULAR | Status: AC
  Administered 2018-11-26: 20 mg via INTRAMUSCULAR
  Filled 2018-11-26: qty 20

## 2018-11-26 MED ORDER — DIPHENHYDRAMINE HCL 50 MG/ML IJ SOLN
25.0000 mg | Freq: Once | INTRAMUSCULAR | Status: AC
  Administered 2018-11-26: 25 mg via INTRAMUSCULAR
  Filled 2018-11-26: qty 1

## 2018-11-26 NOTE — ED Notes (Signed)
Pt sitting in recliner w/eyes closed - pt intermittently wakes and asks to see the dr.

## 2018-11-26 NOTE — Progress Notes (Signed)
Pt. meets criteria for inpatient treatment per Greer Ee, MD.  Referred out to the following hospitals:   [facility]  [facility]  [facility]  [facility]  [facility]  [facility]  [facility]  [facility]  [facility]  [facility]  [facility]  [facility]  [facility]  [facility]  [facility]  [facility]  [facility]     Disposition CSW will continue to follow for placement.  Timmothy Euler. Kaylyn Lim, MSW, LCSWA Disposition Clinical Social Work [phone] (cell) [phone] (office)

## 2018-11-26 NOTE — ED Notes (Signed)
Pt awake - ambulating to desk every few minutes - noted w/repetitive statements and asking for "breakfast". When Sitter advised pt lunch will be served soon and that he has already eaten breakfast, pt states "You lying".

## 2018-11-26 NOTE — ED Notes (Signed)
Breakfast tray ordered 

## 2018-11-26 NOTE — ED Notes (Signed)
Pt ambulating in hallway - states "If I was at [facility], I would have done been gone. When is the dr coming?" Advised pt [facility] aware. Pt voiced understanding and states he remembers Alice, [facility] NP, coming to see him yesterday and was unable to d/c him d/t his inappropriate behavior. States "I know because she pissed me off". Pt sat back down in recliner - given Ginger Ale and snack as requested.
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
## 2018-11-26 NOTE — ED Notes (Signed)
Pt woke - attempted to ambulate to bathroom - Sitter stand-by-assist. Pt went back into his room and returned to sleeping.

## 2018-11-26 NOTE — ED Notes (Addendum)
Pt called his sister who advised she is not able to come pick him up d/t she works until 1800 in [place]. Pt states "they always just discharge me at [facility] by myself. I'm a grown-ass man". ALL belongings - 1 labeled belongings bag - returned to pt - Pt signed verifying all items present - No Valuables Envelope noted. Pt voiced understanding to follow up w/[facility] as instructed. IVC papers rescinded - copy faxed to [facility], copy sent to Medical Records, and original placed in folder for [employer].

## 2018-11-26 NOTE — ED Notes (Signed)
Pt continuing to ask every 5 minutes for the time and when he is going to see the dr. Andrey CotaStates he has a job interview and wants to be d/c'd. Spoke w/Chris, [facility] - advised she will notify Shuvon - second request.

## 2018-11-26 NOTE — ED Provider Notes (Signed)
Patient is alert cooperative pleasant ambulates without difficulty Glasgow Coma Score 15 vehemently denies wanting to harm himself or anyone else.  Plan discharge to home IVC rescinded by me.  He is to follow-up with Hilbert Odor, Joyous Gleghorn, MD 11/26/18 [phone]

## 2018-11-26 NOTE — ED Notes (Addendum)
Pt attempted to leave ED then went into bathroom - refusing to come out. States he wants to fight. Pt voiced he is picking out which RN and Security he wants to fight - pointing to the staff. Pt escorted to room by Off-Duty and Security. Pt yelling, cursing, refusing to stay in his room, threatening staff - including RN, Security, and Off-Duty [employer]. Bed removed from room. Mattress on floor for safety d/t pt attempting to push bed into staff. Pt singing loudly "I've got murder on my mind!" Stating "Fuck you and fuck that bitch!"

## 2018-11-26 NOTE — ED Notes (Signed)
Philip [name], [facility], to request [facility] NP to re-evaluate pt as pt repeatedly asking to see the dr so he may be d/c'd. States "I'm ready to go home". Pt noted to be sitting in chair in doorway of room - w/blanket covering him.

## 2018-11-26 NOTE — ED Notes (Signed)
Patient was given 2 Cartons of Milk.

## 2018-11-26 NOTE — ED Notes (Signed)
Pt took meds w/o difficulty. Pt given milk as requested.

## 2018-11-26 NOTE — ED Notes (Signed)
See downtime note from Dr Ethelda Chick.

## 2018-11-26 NOTE — ED Notes (Signed)
Pt standing in hallway talking w/Sitter.

## 2018-11-26 NOTE — ED Notes (Signed)
Regular Diet was ordered for Lunch. 

## 2018-12-04 ENCOUNTER — Other Ambulatory Visit: Payer: Self-pay

## 2018-12-04 ENCOUNTER — Emergency Department (HOSPITAL_COMMUNITY)
Admission: EM | Admit: 2018-12-04 | Discharge: 2018-12-05 | Disposition: A | Discharge status: Home / Self Care | Payer: Self-pay | Attending: Emergency Medicine | Admitting: Emergency Medicine

## 2018-12-04 DIAGNOSIS — F259 Schizoaffective disorder, unspecified: Secondary | ICD-10-CM | POA: Insufficient documentation

## 2018-12-04 DIAGNOSIS — Z046 Encounter for general psychiatric examination, requested by authority: Secondary | ICD-10-CM | POA: Insufficient documentation

## 2018-12-04 DIAGNOSIS — Z79899 Other long term (current) drug therapy: Secondary | ICD-10-CM | POA: Insufficient documentation

## 2018-12-04 DIAGNOSIS — J45909 Unspecified asthma, uncomplicated: Secondary | ICD-10-CM | POA: Insufficient documentation

## 2018-12-04 DIAGNOSIS — R45850 Homicidal ideations: Secondary | ICD-10-CM | POA: Insufficient documentation

## 2018-12-04 DIAGNOSIS — F250 Schizoaffective disorder, bipolar type: Secondary | ICD-10-CM | POA: Diagnosis present

## 2018-12-04 DIAGNOSIS — F17210 Nicotine dependence, cigarettes, uncomplicated: Secondary | ICD-10-CM | POA: Insufficient documentation

## 2018-12-04 LAB — CBC
HCT: 42.1 % (ref 39.0–52.0)
Hemoglobin: 12.8 g/dL — ABNORMAL LOW (ref 13.0–17.0)
MCH: 27.8 pg (ref 26.0–34.0)
MCHC: 30.4 g/dL (ref 30.0–36.0)
MCV: 91.5 fL (ref 80.0–100.0)
NRBC: 0 % (ref 0.0–0.2)
Platelets: 319 10*3/uL (ref 150–400)
RBC: 4.6 MIL/uL (ref 4.22–5.81)
RDW: 14.8 % (ref 11.5–15.5)
WBC: 8.7 10*3/uL (ref 4.0–10.5)

## 2018-12-04 LAB — RAPID URINE DRUG SCREEN, HOSP PERFORMED
Amphetamines: NOT DETECTED
BENZODIAZEPINES: NOT DETECTED
Barbiturates: NOT DETECTED
Cocaine: NOT DETECTED
Opiates: NOT DETECTED
Tetrahydrocannabinol: POSITIVE — AB
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
## 2018-12-05 LAB — COMPREHENSIVE METABOLIC PANEL
ALT: 20 U/L (ref 0–44)
ANION GAP: 9 (ref 5–15)
AST: 20 U/L (ref 15–41)
Albumin: 4.7 g/dL (ref 3.5–5.0)
Alkaline Phosphatase: 91 U/L (ref 38–126)
BUN: 16 mg/dL (ref 6–20)
CO2: 21 mmol/L — ABNORMAL LOW (ref 22–32)
Calcium: 9.6 mg/dL (ref 8.9–10.3)
Chloride: 109 mmol/L (ref 98–111)
Creatinine, Ser: 0.91 mg/dL (ref 0.61–1.24)
GFR calc Af Amer: >60 mL/min (ref >60)
GFR calc non Af Amer: >60 mL/min (ref >60)
Glucose, Bld: 71 mg/dL (ref 70–99)
POTASSIUM: 3.9 mmol/L (ref 3.5–5.1)
Sodium: 139 mmol/L (ref 135–145)
Total Bilirubin: 0.4 mg/dL (ref 0.3–1.2)
Total Protein: 7.9 g/dL (ref 6.5–8.1)

## 2018-12-05 LAB — SALICYLATE LEVEL: Salicylate Lvl: <7 mg/dL (ref 2.8–30.0)

## 2018-12-05 LAB — ACETAMINOPHEN LEVEL: Acetaminophen (Tylenol), Serum: <10 ug/mL — ABNORMAL LOW (ref 10–30)

## 2018-12-05 LAB — ETHANOL: Alcohol, Ethyl (B): <10 mg/dL (ref <10)

## 2018-12-05 NOTE — ED Notes (Signed)
Pt ambulatory to room #31. Personal property moved from locker #41 to pt new room #31.

## 2018-12-05 NOTE — BH Assessment (Signed)
Assessment Note  Philip [name] is an 28 y.o. male who [facility] after police were called to his home.  Patient states that he drank a fifth of Hennessy and states that he smoked an ounce of marijuana yesterday.  Patient states that he has been upset and angry since MaranaKobe Bryant died.  He states that when the police showed up at his home that he got angry and states that he made threats to kill others because he was  "tired of the bullshit."  Patient states that he was just released from jail on Monday.  Patient states that he was in the ED [facility] 11/26/2018 and missed his court date.  He states that he was picked up and put in jail and just got out 3 days ago and has a new court date for 12/17/2018.  His charges are assault on a male, communicating threats and destruction of property. Patient states that [facility] since [facility] release from jail.  He states that [facility].  Patient states that he came to the hospital because "I needed to let you know what was going on."  Patient denies any history of suicidal ideation, he had homicidal thoughts last pm, but states that he does not feel homicidal at present.  Patient states that he is currently not experiencing any homicidal ideation.  Patient states that he has been sleeping "all day, everyday."  He states that his appetite has been good.  He denies any history os self-mutilation or abuse.  Patient presents as alert and oriented, he speaks very loudly, but was pleasant during his assessment.  He presents with very poor impulse control and his judgment and insight are fair.  His thoughts are relatively organized and his memory is intact.  He denies any current hallucinations and he is able to contract for safety.  Diagnosis: F-25  Schizo-affective Disorder  Past Medical History:  Past Medical History:  Diagnosis Date  . ADHD (attention deficit hyperactivity disorder)   . Asthma   .  Bipolar affective disorder (HCC)   . Schizophrenia [facility](HCC)     Past Surgical History:  Procedure Laterality Date  . NO PAST SURGERIES      Family History:  Family History  Problem Relation Age of Onset  . Hypertension Mother     Social History:  reports that he has been smoking cigarettes. He has a 6.00 pack-year smoking history. He has never used smokeless tobacco. He reports current alcohol use of about 1.0 standard drinks of alcohol per week. He reports current drug use. Drug: Marijuana.  Additional Social History:  Alcohol / Drug Use Pain Medications: SEE MAR.  Prescriptions: Pt reports being prescribed medication for Schizophrenia and PTSD. Pt would not report the names of the medications.  Over the Counter: SEE MAR.  History of alcohol / drug use?: Yes Longest period of sobriety (when/how long): none reported Substance #1 Name of Substance 1: Marijuana  1 - Age of First Use: 12 1 - Amount (size/oz): 1 ounce 1 - Frequency: daily 1 - Duration: since onset 1 - Last Use / Amount: states he smoke one ounce yesterday Substance #2 Name of Substance 2: alcohol 2 - Age of First Use: states his mother put liquor in his bottle 2 - Amount (size/oz): Fifth 2 - Frequency: daily 2 - Duration: unknown 2 - Last Use / Amount: yesterday, 1 fifth  CIWA: CIWA-Ar BP: (!) 137/96  Pulse Rate: 68 COWS:    Allergies:  Allergies  Allergen Reactions  . Shrimp [Shellfish Allergy] Anaphylaxis  . Pork-Derived Products     Does not eat it    Home Medications: (Not in a hospital admission)   OB/GYN Status:  No LMP for male patient.  General Assessment Data Assessment unable to be completed: Yes Reason for not completing assessment: Multiple requests for service Location of Assessment: [facility] TTS Assessment: In system Is this a Tele or Face-to-Face Assessment?: Tele Assessment Is this an Initial Assessment or a Re-assessment for this encounter?: Initial Assessment Patient Accompanied  by:: Other(police) Language Other than English: No Living Arrangements: Other (Comment)(lives with mother) What gender do you identify as?: Male Marital status: Single Living Arrangements: Parent Can pt return to current living arrangement?: Yes Admission Status: Voluntary Is patient capable of signing voluntary admission?: Yes Referral Source: Self/Family/Friend Insurance type: self-pay     Crisis Care Plan Living Arrangements: Parent Legal Guardian: Other:([facility]) Name of Psychiatrist: Vesta Mixer Name of Therapist: [facility]  Education Status Is patient currently in school?: No Is the patient employed, unemployed or receiving disability?: Unemployed  Risk to self with the past 6 months Suicidal Ideation: No Has patient been a risk to self within the past 6 months prior to admission? : No Suicidal Intent: No Has patient had any suicidal intent within the past 6 months prior to admission? : No Is patient at risk for suicide?: No Suicidal Plan?: No Has patient had any suicidal plan within the past 6 months prior to admission? : No Access to Means: No What has been your use of drugs/alcohol within the last 12 months?: daily alcohol and marijuana Previous Attempts/Gestures: No How many times?: 0 Other Self Harm Risks: (N/A) Triggers for Past Attempts: Unknown Intentional Self Injurious Behavior: None Family Suicide History: No Recent stressful life event(s): Legal Issues(Legal issues) Persecutory voices/beliefs?: No Depression: No Substance abuse history and/or treatment for substance abuse?: Yes Suicide prevention information given to non-admitted patients: Not applicable  Risk to Others within the past 6 months Homicidal Ideation: Yes-Currently Present Does patient have any lifetime risk of violence toward others beyond the six months prior to admission? : Yes (comment) Thoughts of Harm to Others: Yes-Currently Present Comment - Thoughts of Harm to Others: thoughts, no  plan or intent, no identified victim Current Homicidal Intent: No Current Homicidal Plan: No Access to Homicidal Means: No Identified Victim: (no identified victim) History of harm to others?: Yes Assessment of Violence: On admission Violent Behavior Description: (no plan) Does patient have access to weapons?: Yes (Comment)(states that he has guns, but they are not at his home) Criminal Charges Pending?: Yes Describe Pending Criminal Charges: communicating threats, assault, destructionto property Does patient have a court date: Yes Court Date: 12/17/18 Is patient on probation?: No  Psychosis Hallucinations: None noted Delusions: None noted  Mental Status Report Appearance/Hygiene: Unremarkable Eye Contact: Good Motor Activity: Restlessness Speech: Unremarkable Level of Consciousness: Alert, Restless Mood: Pleasant Affect: Anxious Anxiety Level: None Thought Processes: Coherent, Relevant Judgement: Partial Orientation: Person, Place, Time, Situation Obsessive Compulsive Thoughts/Behaviors: None  Cognitive Functioning Concentration: Normal Memory: Recent Intact, Remote Intact Is patient IDD: No Insight: Fair Impulse Control: Poor Appetite: Fair Have you had any weight changes? : No Change Sleep: Increased Total Hours of Sleep: 14 Vegetative Symptoms: None  ADLScreening [facility]) Patient's cognitive ability adequate to safely complete daily activities?: Yes Patient able to express need for assistance with ADLs?: Yes Independently performs ADLs?: Yes (appropriate  for developmental age)  Prior Inpatient Therapy Prior Inpatient Therapy: Yes Prior Therapy Dates: 2017, 2015 [facility] Reason for Treatment: schizo-affective disorder  Prior Outpatient Therapy Prior Outpatient Therapy: Yes Prior Therapy Dates: (active) Prior Therapy Facilty/Provider(s): [facility] Reason for Treatment: medication management Does patient have  an ACCT team?: No Does patient have Intensive In-House Services?  : No Does patient have [facility] services? : Yes Does patient have P4CC services?: No  ADL Screening (condition at time of admission) Patient's cognitive ability adequate to safely complete daily activities?: Yes Is the patient deaf or have difficulty hearing?: No Does the patient have difficulty seeing, even when wearing glasses/contacts?: No Does the patient have difficulty concentrating, remembering, or making decisions?: No Patient able to express need for assistance with ADLs?: Yes Does the patient have difficulty dressing or bathing?: No Independently performs ADLs?: Yes (appropriate for developmental age) Does the patient have difficulty walking or climbing stairs?: No Weakness of Legs: None Weakness of Arms/Hands: None  Home Assistive Devices/Equipment Home Assistive Devices/Equipment: None  Therapy Consults (therapy consults require a physician order) PT Evaluation Needed: No OT Evalulation Needed: No SLP Evaluation Needed: No Abuse/Neglect Assessment (Assessment to be complete while patient is alone) Abuse/Neglect Assessment Can Be Completed: Yes Physical Abuse: Denies Verbal Abuse: Denies Sexual Abuse: Denies Exploitation of patient/patient's resources: Denies Self-Neglect: Denies Values / Beliefs Cultural Requests During Hospitalization: None Spiritual Requests During Hospitalization: None Consults Spiritual Care Consult Needed: No Social Work Consult Needed: No Merchant navy officerAdvance Directives (For Healthcare) Does Patient Have a Medical Advance Directive?: No Would patient like information on creating a medical advance directive?: No - Patient declined Nutrition Screen- [facility] Has the patient recently lost weight without trying?: No Has the patient been eating poorly because of a decreased appetite?: No Malnutrition Screening Tool Score: 0        Disposition: Per Dr. Lucianne MussKumar and Elta GuadeloupeLaurie Parks, NP,  patient does not meet inpatient admission criteria and can follow-up with [facility]. Disposition Initial Assessment Completed for this Encounter: Yes Disposition of Patient: Discharge  On Site Evaluation by:   Reviewed with Physician:    Arnoldo Lenisanny J Jep Dyas 12/05/2018 7:50 AM

## 2018-12-05 NOTE — ED Notes (Signed)
Pt alert and oriented. Pt denies any pain or discomfort. Pt denies si. C/o of hi. Pt refused to contract to safety. Pt in hallway talking to staff. Pt belongings placed in day room. Pt safe, will continue to monitor.

## 2018-12-05 NOTE — ED Notes (Signed)
Pt was yelling and threatening staff.  He was discharged safely with security escort  All belongings were sent with patient.

## 2018-12-05 NOTE — ED Notes (Signed)
Belongings placed in locker 41.

## 2018-12-05 NOTE — [facility] Suicide Risk Assessment (Cosign Needed)
Suicide Risk Assessment  Discharge Assessment   [facility] Discharge Suicide Risk Assessment   Principal Problem: Schizoaffective disorder, bipolar type [facility]) Discharge Diagnoses: Principal Problem:   Schizoaffective disorder, bipolar type (HCC)   Total Time spent with patient: 30 minutes  Musculoskeletal: Strength & Muscle Tone: within normal limits Gait & Station: normal Patient leans: N/A  Psychiatric Specialty Exam:   Blood pressure (!) 137/96, pulse 68, temperature 97.9 F (36.6 C), temperature source Oral, resp. rate 16, SpO2 100 %.There is no height or weight on file to calculate BMI.  General Appearance: Casual  Eye Contact::  Fair  Speech:  Clear and Coherent409  Volume:  Increased  Mood:  Angry and Irritable  Affect:  Congruent and Inappropriate  Thought Process:  Coherent and Descriptions of Associations: Intact  Orientation:  Full (Time, Place, and Person)  Thought Content:  Logical  Suicidal Thoughts:  No  Homicidal Thoughts:  No  Memory:  Immediate;   Good Recent;   Fair Remote;   Fair  Judgement:  Poor  Insight:  Shallow  Psychomotor Activity:  Normal  Concentration:  Fair  Recall:  Fair  Fund of Knowledge:Good  Language: Good  Akathisia:  No  Handed:  Right  AIMS (if indicated):     Assets:  Architect Housing  Sleep:     Cognition: WNL  ADL's:  Intact   Mental Status Per Nursing Assessment::   On Admission:   Pt with loud, aggressive stance in the TCU, threatening to "mess up them white bitches." Pt is known to this ED system and frequently presents with loud and aggressive behavior. Pt has had 8 visits in 6 months. He does not stay on his medications and UDS positive for THC/BAL negative. Pt is psychiatrically clear.   Demographic Factors:  Male and Adolescent or young adult  Loss Factors: Financial problems/change in socioeconomic status  Historical Factors: Family history of mental illness or substance  abuse  Risk Reduction Factors:   Sense of responsibility to family  Continued Clinical Symptoms:  Bipolar Disorder:   Mixed State  Cognitive Features That Contribute To Risk:  Closed-mindedness    Suicide Risk:  Minimal: No identifiable suicidal ideation.  Patients presenting with no risk factors but with morbid ruminations; may be classified as minimal risk based on the severity of the depressive symptoms    Plan Of Care/Follow-up recommendations:  Activity:  as tolerated Diet:  Heart Healthy  Laveda Abbe, NP 12/05/2018, 8:14 AM

## 2018-12-05 NOTE — BH Assessment (Signed)
[facility] Assessment Progress Note   Pt to be seen by TTS soon.  Multiple request for service.

## 2018-12-05 NOTE — BH Assessment (Signed)
[facility] Assessment Progress Note   Patient is sleeping now.  TTS to see patient when he is alert and oriented.

## 2018-12-05 NOTE — ED Provider Notes (Signed)
[facility]-EMERGENCY DEPT Provider Note   CSN: [id number] Arrival date & time: 12/04/18  2254     History   Chief Complaint Chief Complaint  Patient presents with  . Homicidal    HPI Philip [name] is a 28 y.o. male.  The history is provided by the patient and medical records.     28 year old male with history of ADHD, asthma, bipolar disorder, schizophrenia, polysubstance abuse, presenting to the ED with homicidal ideation.  When talking with him he states "I need to take a break and chill out, it is too much right now now about to kill some people".  No specific plan voiced.  When asking him if he has hurt anyone recently, he states "not today".  He denies any suicidal ideation.  No hallucinations.  No alcohol use recently.  Has continued smoking marijuana.  Past Medical History:  Diagnosis Date  . ADHD (attention deficit hyperactivity disorder)   . Asthma   . Bipolar affective disorder (HCC)   . Schizophrenia [facility](HCC)     Patient Active Problem List   Diagnosis Date Noted  . 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 Schizoaffective disorder (HCC) 11/09/2015  . Mood disorder (HCC)   . Bipolar I disorder with mania (HCC)   . Agitation 09/16/2014  . Polysubstance abuse (HCC) 09/16/2014  . Cannabis use disorder, severe, dependence (HCC)   . Hyperammonemia (HCC) 09/07/2014  . Schizoaffective disorder, bipolar type (HCC)   . 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 NO PAST SURGERIES          Home Medications    Prior to Admission medications   Medication Sig Start Date End Date Taking? Authorizing Provider  hydrOXYzine (ATARAX/VISTARIL) 50 MG tablet Take 1 tablet (50 mg total) by mouth 3 (three) times daily. 11/23/18   Charm RingsLord, Jamison Y, NP  lamoTRIgine (LAMICTAL) 200 MG tablet Take 1 tablet (200 mg total) by mouth every evening. 11/23/18   Charm RingsLord, Jamison Y, NP   mirtazapine (REMERON) 15 MG tablet Take 1 tablet (15 mg total) by mouth at bedtime. 11/23/18   Charm RingsLord, Jamison Y, NP  OLANZapine (ZYPREXA) 20 MG tablet Take 1 tablet (20 mg total) by mouth every evening. 11/23/18   Charm RingsLord, Jamison Y, NP    Family History Family History  Problem Relation Age of Onset  . Hypertension Mother     Social History Social History   Tobacco Use  . Smoking status: Current Every Day Smoker    Packs/day: 1.00    Years: 6.00    Pack years: 6.00    Types: Cigarettes  . Smokeless tobacco: Never Used  . Tobacco comment: unable to assess  Substance Use Topics  . Alcohol use: Yes    Alcohol/week: 1.0 standard drinks    Types: 1 Cans of beer per week  . Drug use: Yes    Types: Marijuana     Allergies   Shrimp [shellfish allergy] and Pork-derived products   Review of Systems Review of Systems  Psychiatric/Behavioral:       Homicidal  All other systems reviewed and are negative.    Physical Exam Updated Vital Signs BP 136/78 (BP Location: Left Arm)   Pulse 68   Temp 98.2 F (36.8 C) (Oral)   Resp 16   SpO2 98%   Physical Exam Vitals signs and nursing note reviewed.  Constitutional:      Appearance: He is well-developed.  HENT:     Head: Normocephalic and atraumatic.  Eyes:     Conjunctiva/sclera: Conjunctivae normal.     Pupils: Pupils are equal, round, and reactive to light.  Neck:     Musculoskeletal: Normal range of motion.  Cardiovascular:     Rate and Rhythm: Normal rate and regular rhythm.     Heart sounds: Normal heart sounds.  Pulmonary:     Effort: Pulmonary effort is normal.     Breath sounds: Normal breath sounds.  Abdominal:     General: Bowel sounds are normal.     Palpations: Abdomen is soft.  Musculoskeletal: Normal range of motion.  Skin:    General: Skin is warm and dry.  Neurological:     Mental Status: He is alert and oriented to person, place, and time.  Psychiatric:     Comments: Talking to himself during  exam HI without plan Denies SI/AVH      ED Treatments / Results  Labs (all labs ordered are listed, but only abnormal results are displayed) Labs Reviewed  COMPREHENSIVE METABOLIC PANEL - Abnormal; Notable for the following components:      Result Value   CO2 21 (*)    All other components within normal limits  ACETAMINOPHEN LEVEL - Abnormal; Notable for the following components:   Acetaminophen (Tylenol), Serum <10 (*)    All other components within normal limits  CBC - Abnormal; Notable for the following components:   Hemoglobin 12.8 (*)    All other components within normal limits  RAPID URINE DRUG SCREEN, HOSP PERFORMED - Abnormal; Notable for the following components:   Tetrahydrocannabinol POSITIVE (*)    All other components within normal limits  ETHANOL  SALICYLATE LEVEL    EKG None  Radiology No results found.  Procedures Procedures (including critical care time)  Medications Ordered in ED Medications - No data to display   Initial Impression / Assessment and Plan / ED Course  I have reviewed the triage vital signs and the nursing notes.  Pertinent labs & imaging results that were available during my care of the patient were reviewed by me and considered in my medical decision making (see chart for details).  28 year old male here with homicidal ideation.  No specific plan voiced.  Denies any suicidal ideation or hallucinations.  Admits to ongoing marijuana use.  Denies any alcohol use recently.  Screening labs are overall reassuring.  Patient medically cleared.  TTS evaluation pending.  Disposition per their recommendations.  Final Clinical Impressions(s) / ED Diagnoses   Final diagnoses:  Homicidal ideation    ED Discharge Orders    None       Garlon Hatchet, PA-C 12/05/18 0559    Paula Libra, MD 12/05/18 [phone]

## 2018-12-18 ENCOUNTER — Encounter (HOSPITAL_COMMUNITY): Payer: Self-pay | Admitting: Emergency Medicine

## 2018-12-18 ENCOUNTER — Emergency Department (HOSPITAL_COMMUNITY)
Admission: EM | Admit: 2018-12-18 | Discharge: 2018-12-19 | Disposition: A | Discharge status: Other Acute Inpatient Hospital | Payer: Self-pay | Attending: Emergency Medicine | Admitting: Emergency Medicine

## 2018-12-18 ENCOUNTER — Other Ambulatory Visit: Payer: Self-pay

## 2018-12-18 DIAGNOSIS — F22 Delusional disorders: Secondary | ICD-10-CM

## 2018-12-18 DIAGNOSIS — J45909 Unspecified asthma, uncomplicated: Secondary | ICD-10-CM | POA: Insufficient documentation

## 2018-12-18 DIAGNOSIS — F1220 Cannabis dependence, uncomplicated: Secondary | ICD-10-CM | POA: Insufficient documentation

## 2018-12-18 DIAGNOSIS — F17210 Nicotine dependence, cigarettes, uncomplicated: Secondary | ICD-10-CM | POA: Insufficient documentation

## 2018-12-18 DIAGNOSIS — F250 Schizoaffective disorder, bipolar type: Secondary | ICD-10-CM | POA: Insufficient documentation

## 2018-12-18 DIAGNOSIS — F259 Schizoaffective disorder, unspecified: Secondary | ICD-10-CM | POA: Diagnosis present

## 2018-12-18 DIAGNOSIS — R45851 Suicidal ideations: Secondary | ICD-10-CM | POA: Insufficient documentation

## 2018-12-18 DIAGNOSIS — R4689 Other symptoms and signs involving appearance and behavior: Secondary | ICD-10-CM

## 2018-12-18 LAB — RAPID URINE DRUG SCREEN, HOSP PERFORMED
Amphetamines: NOT DETECTED
Barbiturates: NOT DETECTED
Benzodiazepines: NOT DETECTED
COCAINE: NOT DETECTED
OPIATES: NOT DETECTED
Tetrahydrocannabinol: POSITIVE — AB

## 2018-12-18 LAB — CBC
HCT: 36.7 % — ABNORMAL LOW (ref 39.0–52.0)
Hemoglobin: 11.5 g/dL — ABNORMAL LOW (ref 13.0–17.0)
MCH: 27.6 pg (ref 26.0–34.0)
MCHC: 31.3 g/dL (ref 30.0–36.0)
MCV: 88.2 fL (ref 80.0–100.0)
Platelets: 364 10*3/uL (ref 150–400)
RBC: 4.16 MIL/uL — ABNORMAL LOW (ref 4.22–5.81)
RDW: 14.3 % (ref 11.5–15.5)
WBC: 6.9 10*3/uL (ref 4.0–10.5)
nRBC: 0 % (ref 0.0–0.2)

## 2018-12-18 LAB — COMPREHENSIVE METABOLIC PANEL
ALT: 25 U/L (ref 0–44)
AST: 37 U/L (ref 15–41)
Albumin: 3.8 g/dL (ref 3.5–5.0)
Alkaline Phosphatase: 64 U/L (ref 38–126)
Anion gap: 6 (ref 5–15)
BUN: 8 mg/dL (ref 6–20)
CO2: 25 mmol/L (ref 22–32)
Calcium: 9.3 mg/dL (ref 8.9–10.3)
Chloride: 109 mmol/L (ref 98–111)
Creatinine, Ser: 1.09 mg/dL (ref 0.61–1.24)
GFR calc Af Amer: >60 mL/min (ref >60)
GFR calc non Af Amer: >60 mL/min (ref >60)
Glucose, Bld: 95 mg/dL (ref 70–99)
Potassium: 4.1 mmol/L (ref 3.5–5.1)
Sodium: 140 mmol/L (ref 135–145)
Total Bilirubin: 0.8 mg/dL (ref 0.3–1.2)
Total Protein: 6.5 g/dL (ref 6.5–8.1)

## 2018-12-18 LAB — ETHANOL: Alcohol, Ethyl (B): <10 mg/dL (ref <10)

## 2018-12-18 LAB — SALICYLATE LEVEL

## 2018-12-18 LAB — ACETAMINOPHEN LEVEL

## 2018-12-18 MED ORDER — OLANZAPINE 10 MG PO TABS
20.0000 mg | ORAL_TABLET | Freq: Every evening | ORAL | Status: DC
  Administered 2018-12-18: 20 mg via ORAL
  Filled 2018-12-18: qty 2

## 2018-12-18 MED ORDER — HALOPERIDOL LACTATE 5 MG/ML IJ SOLN
10.0000 mg | Freq: Once | INTRAMUSCULAR | Status: AC
  Administered 2018-12-18: 10 mg via INTRAMUSCULAR
  Filled 2018-12-18: qty 2

## 2018-12-18 MED ORDER — ZIPRASIDONE MESYLATE 20 MG IM SOLR
20.0000 mg | Freq: Two times a day (BID) | INTRAMUSCULAR | Status: DC | PRN
  Administered 2018-12-19: 20 mg via INTRAMUSCULAR
  Filled 2018-12-18: qty 20

## 2018-12-18 MED ORDER — MIRTAZAPINE 15 MG PO TABS
15.0000 mg | ORAL_TABLET | Freq: Every day | ORAL | Status: DC
  Administered 2018-12-18: 15 mg via ORAL
  Filled 2018-12-18 (×2): qty 1

## 2018-12-18 MED ORDER — LAMOTRIGINE 100 MG PO TABS
200.0000 mg | ORAL_TABLET | Freq: Every evening | ORAL | Status: DC
  Administered 2018-12-18: 200 mg via ORAL
  Filled 2018-12-18: qty 2

## 2018-12-18 MED ORDER — LORAZEPAM 1 MG PO TABS: 1.0000 mg | ORAL_TABLET | ORAL | Status: DC | PRN

## 2018-12-18 MED ORDER — DIPHENHYDRAMINE HCL 25 MG PO CAPS
50.0000 mg | ORAL_CAPSULE | Freq: Once | ORAL | Status: AC
  Administered 2018-12-18: 50 mg via ORAL
  Filled 2018-12-18: qty 2

## 2018-12-18 MED ORDER — HYDROXYZINE HCL 25 MG PO TABS
50.0000 mg | ORAL_TABLET | Freq: Three times a day (TID) | ORAL | Status: DC
  Administered 2018-12-18 (×2): 50 mg via ORAL
  Filled 2018-12-18 (×2): qty 1

## 2018-12-18 NOTE — ED Notes (Signed)
Pt Cursing at police officers and stating he wants to kill them.  This RN and several others tried to calm him down without relief.  Pt seen by Dr. Jacqulyn Bath.

## 2018-12-18 NOTE — BH Assessment (Addendum)
Assessment Note  Philip [name] is an 28 y.o. male.  The pt came in by [employer].  The pt stated he think a neighbor called the police on him.  The pt stated he was sitting at home doing his home work and he opened his window.  The window came out of the frame and the pt had to go outside to fix the window.  When the police arrived, the pt told the police he would "shoot them in the head".  The pt denies having a gun, but stated he can get one.  The pt has recent charges of possession of a firearm.  The pt goes to [facility], but he hasn't been taking his medication for the past month.  He was last inpatient 08/2014 at [facility]  The pt stated he lives alone and his mother is on life support after having a stroke.  He denies self harm.  He has court dates in March for carrying a concealed weapon, disorderly conduct and possession of marijuana.  He stated he is sleeping and eating well.  The pt is smoking an 1/8th of marijuana a day.  His UDS is positive for marijuana.  Pt is dressed in scrubs. He is drowsy and oriented x3.  The pt stated the month is March. Pt speaks in a clear tone, at moderate volume and normal pace. Eye contact is good. Pt's mood is pleasant.   Diagnosis: F25.0 Schizoaffective disorder, Bipolar type  F12.20 Cannabis use disorder, Severe  Past Medical History:  Past Medical History:  Diagnosis Date  . ADHD (attention deficit hyperactivity disorder)   . Asthma   . Bipolar affective disorder (HCC)   . Schizophrenia [facility])     Past Surgical History:  Procedure Laterality Date  . NO PAST SURGERIES      Family History:  Family History  Problem Relation Age of Onset  . Hypertension Mother     Social History:  reports that he has been smoking cigarettes. He has a 6.00 pack-year smoking history. He has never used smokeless tobacco. He reports current alcohol use of about 1.0 standard drinks of alcohol per week. He reports current drug use. Drug: Marijuana.  Additional Social  History:  Alcohol / Drug Use Pain Medications: See MAR Prescriptions: See MAR Over the Counter: See MAR History of alcohol / drug use?: Yes Longest period of sobriety (when/how long): unknown Substance #1 Name of Substance 1: marijuana 1 - Age of First Use: unknown pt stated, "Out the wound, I'm rastafarian" 1 - Amount (size/oz): 1/8th  1 - Frequency: daily 1 - Duration: ongoing 1 - Last Use / Amount: 12/17/2018  CIWA: CIWA-Ar BP: 137/89 Pulse Rate: 82 COWS:    Allergies:  Allergies  Allergen Reactions  . Shrimp [Shellfish Allergy] Anaphylaxis  . Pork-Derived Products     Does not eat it    Home Medications: (Not in a hospital admission)   OB/GYN Status:  No LMP for male patient.  General Assessment Data Assessment unable to be completed: Yes Reason for not completing assessment: Per nurse pt is agitated and not appropriate in an appropriate state to complete assessment.  TTS will try at another time Location of Assessment: [facility] ED TTS Assessment: In system Is this a Tele or Face-to-Face Assessment?: Face-to-Face Is this an Initial Assessment or a Re-assessment for this encounter?: Initial Assessment Patient Accompanied by:: N/A Language Other than English: No Living Arrangements: (house) What gender do you identify as?: Male Marital status: Single Living Arrangements:  Alone Can pt return to current living arrangement?: Yes Admission Status: Involuntary Petitioner: ED Attending Is patient capable of signing voluntary admission?: No Referral Source: Other(unknown) Insurance type: Self Pay     Crisis Care Plan Living Arrangements: Alone Legal Guardian: Other:(Self) Name of Psychiatrist: Vesta Mixer Name of Therapist: [facility]  Education Status Is patient currently in school?: Yes(pt states he is in school.  Unclear is pt is in school.) Current Grade: unknown Highest grade of school patient has completed: [employer] Name of school: [employer] Is the  patient employed, unemployed or receiving disability?: Unemployed  Risk to self with the past 6 months Suicidal Ideation: No Has patient been a risk to self within the past 6 months prior to admission? : No Suicidal Intent: No Has patient had any suicidal intent within the past 6 months prior to admission? : No Is patient at risk for suicide?: No Suicidal Plan?: No Has patient had any suicidal plan within the past 6 months prior to admission? : No Access to Means: No What has been your use of drugs/alcohol within the last 12 months?: pt denies Previous Attempts/Gestures: No How many times?: 0 Other Self Harm Risks: Pt denies Triggers for Past Attempts: Unknown Intentional Self Injurious Behavior: None Family Suicide History: No Recent stressful life event(s): Legal Issues Persecutory voices/beliefs?: No Depression: No Substance abuse history and/or treatment for substance abuse?: Yes Suicide prevention information given to non-admitted patients: Not applicable  Risk to Others within the past 6 months Homicidal Ideation: Yes-Currently Present Does patient have any lifetime risk of violence toward others beyond the six months prior to admission? : Yes (comment)(was threatening police earlier today) Thoughts of Harm to Others: Yes-Currently Present Comment - Thoughts of Harm to Others: thoughts to shoot police Current Homicidal Intent: No Current Homicidal Plan: Yes-Currently Present Describe Current Homicidal Plan: shoot police Access to Homicidal Means: No(pt says he can get a gun) Identified Victim: police History of harm to others?: No Assessment of Violence: On admission Violent Behavior Description: was threatening to kill police officers Does patient have access to weapons?: Yes (Comment)(pt stated he can get a gun) Criminal Charges Pending?: Yes Describe Pending Criminal Charges: carrying a concealed weapon, disorderly conduct and possession of marijuana Does patient have  a court date: Yes Court Date: 01/21/19 Is patient on probation?: No  Psychosis Hallucinations: Visual Delusions: Grandiose  Mental Status Report Appearance/Hygiene: Unremarkable Eye Contact: Good Motor Activity: Freedom of movement Speech: Unremarkable Level of Consciousness: Drowsy Mood: Pleasant Affect: Appropriate to circumstance Anxiety Level: None Thought Processes: Coherent, Relevant Judgement: Impaired Orientation: Person, Place, Situation Obsessive Compulsive Thoughts/Behaviors: None  Cognitive Functioning Concentration: Normal Memory: Recent Intact, Remote Intact Is patient IDD: No Insight: Poor Impulse Control: Poor Appetite: Good Have you had any weight changes? : No Change Sleep: No Change Total Hours of Sleep: 8 Vegetative Symptoms: None  ADLScreening [facility] Assessment Services) Patient's cognitive ability adequate to safely complete daily activities?: Yes Patient able to express need for assistance with ADLs?: Yes Independently performs ADLs?: Yes (appropriate for developmental age)  Prior Inpatient Therapy Prior Inpatient Therapy: Yes Prior Therapy Dates: 2017, 2015 Prior Therapy Facilty/Provider(s): [facility] Reason for Treatment: schizo-affective disorder  Prior Outpatient Therapy Prior Outpatient Therapy: Yes Prior Therapy Dates: CURRENT Prior Therapy Facilty/Provider(s): [facility] Reason for Treatment: medication management Does patient have an ACCT team?: No Does patient have Intensive In-House Services?  : No Does patient have [facility] services? : Yes Does patient have P4CC services?: No  ADL Screening (condition at  time of admission) Patient's cognitive ability adequate to safely complete daily activities?: Yes Patient able to express need for assistance with ADLs?: Yes Independently performs ADLs?: Yes (appropriate for developmental age)       Abuse/Neglect Assessment (Assessment to be complete while patient is alone) Abuse/Neglect  Assessment Can Be Completed: Yes Physical Abuse: Denies Verbal Abuse: Denies Sexual Abuse: Denies Exploitation of patient/patient's resources: Denies Self-Neglect: Denies Values / Beliefs Cultural Requests During Hospitalization: None Spiritual Requests During Hospitalization: None Consults Spiritual Care Consult Needed: No Social Work Consult Needed: No Merchant navy officerAdvance Directives (For Healthcare) Does Patient Have a Medical Advance Directive?: No Would patient like information on creating a medical advance directive?: No - Patient declined          Disposition:  Disposition Initial Assessment Completed for this Encounter: Yes   NP Nanine MeansJamison Lord recommends inpatient treatment.  RN and MD were made aware of the recommendation.  On Site Evaluation by:   Reviewed with Physician:    Ottis StainGarvin, Jonel Weldon Jermaine 12/18/2018 7:05 PM

## 2018-12-18 NOTE — ED Notes (Signed)
Pt given 10 haldol and was cooperative and continues the same currently.

## 2018-12-18 NOTE — BHH Counselor (Signed)
  TTS contacted [facility] to complete assessment.  Megan, RN stated "pt is too agitated and it is not a good time to complete the assessment" pt's nurse confirmed recommendation.  TTS will complete assessment at a later time.  Gurney Balthazor L. Sabrina Keough, MS, [facility], [facility] Therapeutic Triage Specialist  [phone]

## 2018-12-18 NOTE — ED Provider Notes (Signed)
[facility] EMERGENCY DEPARTMENT Provider Note   CSN: [id number] Arrival date & time: 12/18/18  1059    History   Chief Complaint Chief Complaint  Patient presents with  . Suicidal    HPI Philip [name] is a 28 y.o. male.     HPI Patient presents via police, reportedly after they were called due to the patient breaking windows. According to police officers the patient was threatening to kill himself in route, requesting to be shot. On my evaluation the patient is clearly delusional, yelling, violent, aggressive, swinging, rambling. Patient acting in a threatening manner, requires sedation after the initial evaluation, as well as involuntary commitment due to active psychosis, delusions, violent behavior, concern for danger to himself and others. Level V caveat secondary to psychiatric condition. Past Medical History:  Diagnosis Date  . ADHD (attention deficit hyperactivity disorder)   . Asthma   . Bipolar affective disorder (HCC)   . Schizophrenia [facility])     Patient Active Problem List   Diagnosis Date Noted  . 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 Schizoaffective disorder (HCC) 11/09/2015  . Mood disorder (HCC)   . Bipolar I disorder with mania (HCC)   . Agitation 09/16/2014  . Polysubstance abuse (HCC) 09/16/2014  . Cannabis use disorder, severe, dependence (HCC)   . Hyperammonemia (HCC) 09/07/2014  . Schizoaffective disorder, bipolar type (HCC)   . 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 NO PAST SURGERIES          Home Medications    Prior to Admission medications   Medication Sig Start Date End Date Taking? Authorizing Provider  hydrOXYzine (ATARAX/VISTARIL) 50 MG tablet Take 1 tablet (50 mg total) by mouth 3 (three) times daily. Patient not taking: Reported on 12/18/2018 11/23/18   Charm Rings, NP  lamoTRIgine (LAMICTAL) 200 MG tablet  Take 1 tablet (200 mg total) by mouth every evening. Patient not taking: Reported on 12/18/2018 11/23/18   Charm Rings, NP  mirtazapine (REMERON) 15 MG tablet Take 1 tablet (15 mg total) by mouth at bedtime. Patient not taking: Reported on 12/18/2018 11/23/18   Charm Rings, NP  OLANZapine (ZYPREXA) 20 MG tablet Take 1 tablet (20 mg total) by mouth every evening. Patient not taking: Reported on 12/18/2018 11/23/18   Charm Rings, NP    Family History Family History  Problem Relation Age of Onset  . Hypertension Mother     Social History Social History   Tobacco Use  . Smoking status: Current Every Day Smoker    Packs/day: 1.00    Years: 6.00    Pack years: 6.00    Types: Cigarettes  . Smokeless tobacco: Never Used  . Tobacco comment: unable to assess  Substance Use Topics  . Alcohol use: Yes    Alcohol/week: 1.0 standard drinks    Types: 1 Cans of beer per week  . Drug use: Yes    Types: Marijuana     Allergies   Shrimp [shellfish allergy] and Pork-derived products   Review of Systems Review of Systems  Unable to perform ROS: Psychiatric disorder     Physical Exam Updated Vital Signs BP 137/89 (BP Location: Right Arm)   Pulse 82   Temp 98 F (36.7 C) (Oral)   Resp 18   SpO2 97%   Physical Exam Vitals signs and nursing note reviewed.  Constitutional:      Appearance: He  is well-developed. He is diaphoretic.     Comments: Aggressive, agitated young male awake and alert delusional.  HENT:     Head: Normocephalic and atraumatic.  Eyes:     Conjunctiva/sclera: Conjunctivae normal.  Pulmonary:     Effort: Pulmonary effort is normal. No respiratory distress.     Breath sounds: No stridor.  Abdominal:     General: There is no distension.  Skin:    General: Skin is warm.  Neurological:     General: No focal deficit present.     Mental Status: He is alert.     Comments: Ambulatory, moving all extremities spontaneously, speech is clear, though  inappropriate.   Psychiatric:        Attention and Perception: He is inattentive.        Behavior: Behavior is agitated, aggressive, hyperactive and combative.        Thought Content: Thought content is paranoid and delusional.        Cognition and Memory: Cognition is impaired.      ED Treatments / Results  Labs (all labs ordered are listed, but only abnormal results are displayed) Labs Reviewed  ACETAMINOPHEN LEVEL - Abnormal; Notable for the following components:      Result Value   Acetaminophen (Tylenol), Serum <10 (*)    All other components within normal limits  CBC - Abnormal; Notable for the following components:   RBC 4.16 (*)    Hemoglobin 11.5 (*)    HCT 36.7 (*)    All other components within normal limits  RAPID URINE DRUG SCREEN, HOSP PERFORMED - Abnormal; Notable for the following components:   Tetrahydrocannabinol POSITIVE (*)    All other components within normal limits  COMPREHENSIVE METABOLIC PANEL  ETHANOL  SALICYLATE LEVEL    EKG None  Radiology No results found.  Procedures Procedures (including critical care time)  Medications Ordered in ED Medications  haloperidol lactate (HALDOL) injection 10 mg (10 mg Intramuscular Given 12/18/18 1213)  diphenhydrAMINE (BENADRYL) capsule 50 mg (50 mg Oral Given 12/18/18 1213)     Initial Impression / Assessment and Plan / ED Course  I have reviewed the triage vital signs and the nursing notes.  Pertinent labs & imaging results that were available during my care of the patient were reviewed by me and considered in my medical decision making (see chart for details).       After the evaluation with concern for the patient's safety, safety of staff, active psychosis patient received Haldol, Benadryl. Involuntary commitment papers executed. 3:47 PM Patient sleeping. Labs unremarkable. Patient has had involuntary commitment papers executed, now after hours after chemical sedation remains in no  distress. With reassuring labs, vitals, patient will require additional evaluation by behavioral health colleagues, when he is awake, capable of interacting.   Final Clinical Impressions(s) / ED Diagnoses  Aggressive behavior Delusions  CRITICAL CARE Performed by: Gerhard Munch Total critical care time: 35 minutes Critical care time was exclusive of separately billable procedures and treating other patients. Critical care was necessary to treat or prevent imminent or life-threatening deterioration. Critical care was time spent personally by me on the following activities: development of treatment plan with patient and/or surrogate as well as nursing, discussions with consultants, evaluation of patient's response to treatment, examination of patient, obtaining history from patient or surrogate, ordering and performing treatments and interventions, ordering and review of laboratory studies, ordering and review of radiographic studies, pulse oximetry and re-evaluation of patient's condition.    Gerhard Munch,  MD 12/18/18 1548

## 2018-12-18 NOTE — ED Notes (Signed)
Kendall at bedside.

## 2018-12-18 NOTE — ED Notes (Signed)
Per [facility], call [phone] when pt in room so they may be TTS'ed.

## 2018-12-18 NOTE — ED Triage Notes (Addendum)
Pt with [employer] custody, called out for breaking windows. Per officer pt was threatening to kill himself and asking them to just shoot him. Pt is delusional seeing things, thinks he is dating a Engineer, civil (consulting) here. The patients mother is on life support here at our facility and has been since September, he lives alone. [employer] requesting IVC.

## 2018-12-18 NOTE — Progress Notes (Signed)
Pt meets inpatient criteria per Nanine Means, NP. Referral information has been sent to the following hospitals for review:  [facility]  [facility]  [facility]  [facility]  [facility]  [facility]  [facility]  [facility]  [facility]   Disposition will continue to assist with inpatient placement needs.   Wells Guiles, LCSW, LCAS Disposition CSW [facility]/TTS [phone]

## 2018-12-18 NOTE — ED Notes (Signed)
IVC paperwork faxed to [facility] 

## 2018-12-19 ENCOUNTER — Encounter (HOSPITAL_COMMUNITY): Payer: Self-pay

## 2018-12-19 ENCOUNTER — Inpatient Hospital Stay (HOSPITAL_COMMUNITY)
Admission: AD | Admit: 2018-12-19 | Discharge: 2018-12-24 | DRG: 885 | Disposition: A | Discharge status: Home / Self Care | Payer: Medicaid Other | Attending: Psychiatry | Admitting: Psychiatry

## 2018-12-19 ENCOUNTER — Other Ambulatory Visit: Payer: Self-pay

## 2018-12-19 DIAGNOSIS — F250 Schizoaffective disorder, bipolar type: Principal | ICD-10-CM | POA: Diagnosis present

## 2018-12-19 DIAGNOSIS — F1220 Cannabis dependence, uncomplicated: Secondary | ICD-10-CM | POA: Diagnosis present

## 2018-12-19 DIAGNOSIS — R45851 Suicidal ideations: Secondary | ICD-10-CM | POA: Diagnosis present

## 2018-12-19 DIAGNOSIS — G4700 Insomnia, unspecified: Secondary | ICD-10-CM | POA: Diagnosis present

## 2018-12-19 DIAGNOSIS — Z91018 Allergy to other foods: Secondary | ICD-10-CM | POA: Diagnosis not present

## 2018-12-19 DIAGNOSIS — F602 Antisocial personality disorder: Secondary | ICD-10-CM | POA: Diagnosis present

## 2018-12-19 DIAGNOSIS — F17210 Nicotine dependence, cigarettes, uncomplicated: Secondary | ICD-10-CM | POA: Diagnosis present

## 2018-12-19 DIAGNOSIS — F259 Schizoaffective disorder, unspecified: Secondary | ICD-10-CM | POA: Diagnosis present

## 2018-12-19 DIAGNOSIS — Z91013 Allergy to seafood: Secondary | ICD-10-CM

## 2018-12-19 DIAGNOSIS — R4183 Borderline intellectual functioning: Secondary | ICD-10-CM | POA: Diagnosis present

## 2018-12-19 DIAGNOSIS — Z87892 Personal history of anaphylaxis: Secondary | ICD-10-CM | POA: Diagnosis not present

## 2018-12-19 MED ORDER — ACETAMINOPHEN 325 MG PO TABS: 650.0000 mg | ORAL_TABLET | Freq: Four times a day (QID) | ORAL | Status: DC | PRN

## 2018-12-19 MED ORDER — MAGNESIUM HYDROXIDE 400 MG/5ML PO SUSP: 30.0000 mL | Freq: Every day | ORAL | Status: DC | PRN

## 2018-12-19 MED ORDER — ALUM & MAG HYDROXIDE-SIMETH 200-200-20 MG/5ML PO SUSP: 30.0000 mL | ORAL | Status: DC | PRN

## 2018-12-19 MED ORDER — HYDROXYZINE HCL 25 MG PO TABS: 25.0000 mg | ORAL_TABLET | Freq: Three times a day (TID) | ORAL | Status: DC | PRN

## 2018-12-19 MED ORDER — MIRTAZAPINE 15 MG PO TBDP
15.0000 mg | ORAL_TABLET | Freq: Every day | ORAL | Status: DC
  Administered 2018-12-19 – 2018-12-23 (×5): 15 mg via ORAL
  Filled 2018-12-19 (×6): qty 1

## 2018-12-19 MED ORDER — OLANZAPINE 10 MG PO TABS
10.0000 mg | ORAL_TABLET | Freq: Two times a day (BID) | ORAL | Status: DC
  Administered 2018-12-19 – 2018-12-21 (×5): 10 mg via ORAL
  Filled 2018-12-19 (×8): qty 1

## 2018-12-19 MED ORDER — TRAZODONE HCL 50 MG PO TABS: 50.0000 mg | ORAL_TABLET | Freq: Every evening | ORAL | Status: DC | PRN

## 2018-12-19 MED ORDER — CARBAMAZEPINE ER 200 MG PO TB12
200.0000 mg | ORAL_TABLET | Freq: Two times a day (BID) | ORAL | Status: DC
  Administered 2018-12-19 – 2018-12-23 (×8): 200 mg via ORAL
  Filled 2018-12-19 (×12): qty 1

## 2018-12-19 MED ORDER — HYDROXYZINE HCL 50 MG PO TABS
50.0000 mg | ORAL_TABLET | Freq: Three times a day (TID) | ORAL | Status: DC | PRN
  Administered 2018-12-21 – 2018-12-23 (×5): 50 mg via ORAL
  Filled 2018-12-19: qty 10
  Filled 2018-12-19 (×5): qty 1

## 2018-12-19 MED ORDER — ACETAMINOPHEN 325 MG PO TABS
650.0000 mg | ORAL_TABLET | Freq: Four times a day (QID) | ORAL | Status: DC | PRN
  Administered 2018-12-20: 650 mg via ORAL
  Filled 2018-12-19: qty 2

## 2018-12-19 NOTE — ED Notes (Signed)
Attempted report x 2 

## 2018-12-19 NOTE — BHH Counselor (Signed)
Adult Comprehensive Assessment  Patient ID: Philip [name], male   DOB: 10-09-1991, 28 y.o.   MRN: [id number]  Information Source: Information source: Patient  Current Stressors:  Patient states their primary concerns and needs for treatment are:: Philip [name] has been having problems with police "they want to kill me.Marland KitchenMarland KitchenMarland KitchenMy trial date January 29, 2019" Patient states their goals for this hospitilization and ongoing recovery are:: Stabilize medication and "figure out what will keep me stabilized" Social relationships: Continues to have trouble with police he stated that they believe "that I am my old self...terrorizing [place]. I've been helping my mom who is in assisted living after a stroke (September 2019)  Living/Environment/Situation:  Living Arrangements: Parent Living conditions (as described by patient or guardian): "House with my mom" Who else lives in the home?: None reported How long has patient lived in current situation?: "Forever" What is atmosphere in current home: Comfortable, Paramedic, Supportive  Family History:  Marital status: Single Are you sexually active?: Yes What is your sexual orientation?: Heterosexual Has your sexual activity been affected by drugs, alcohol, medication, or emotional stress?: None reported Does patient have children?: No  Childhood History:  By whom was/is the patient raised?: Mother Additional childhood history information: Father absent Description of patient's relationship with caregiver when they were a child: Great Patient's description of current relationship with people who raised him/her: "I love my...mama" How were you disciplined when you got in trouble as a child/adolescent?: Appropriate Does patient have siblings?: Yes Number of Siblings: 1 Description of patient's current relationship with siblings: Older sister, Philip [name] Did patient suffer from severe childhood neglect?: No Was the patient ever a victim of a crime or a  disaster?: No Witnessed domestic violence?: No Has patient been effected by domestic violence as an adult?: No  Education:  Highest grade of school patient has completed: GED Currently a student?: No Learning disability?: No  Employment/Work Situation:   Where is patient currently employed?: [employer] How long has patient been employed?: Will start when he leaves [name] Patient's job has been impacted by current illness: No What is the longest time patient has a held a job?: 1 month 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?: No Are There Guns or Other Weapons in Your Home?: Yes Types of Guns/Weapons: Removed from the home last week, does not know where Are These Weapons Safely Secured?: Yes  Financial Resources:   Financial resources: No income Does patient have a Lawyer or guardian?: No  Alcohol/Substance Abuse:   What has been your use of drugs/alcohol within the last 12 months?: on the weekend 1/5 [place], 1x month 1 8oz beer, 1 small glass/evenings; 4 blunts/daily If yes, describe treatment: 2013 ADS 9 months [facility] Has alcohol/substance abuse ever caused legal problems?: No  Social Support System:   Describe Community Support System: Sister Philip [name] Type of faith/religion: "God" How does patient's faith help to cope with current illness?: "I pray"  Leisure/Recreation:   Leisure and Hobbies: "Music"  Strengths/Needs:   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 Patient states they can use these personal strengths during their treatment to contribute to their recovery: "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" Patient states these barriers may affect/interfere with their treatment: None reported  Discharge Plan:   Currently receiving community mental health services: Yes (From Whom)(Philip [name]  (doctor)) Patient states  concerns and preferences for aftercare planning are: Philip [name] Patient states they will know when they are safe and ready for discharge when: "When you say you are ready" Does patient have access to transportation?: Yes(Sister) Does patient have financial barriers related to discharge medications?: No Patient description of barriers related to discharge medications: None reported Will patient be returning to same living situation after discharge?: Yes  Summary/Recommendations:   Summary and Recommendations (to be completed by the evaluator): "Philip [name]" Philip [name] is a 28 yo [place] American male. He presents involuntarily with symptoms of "problems with the police". He is diagnosed with Schizoaffective disorder, Bipolar type. He requests continued care from Philip [name], While here, Philip [name] may benefit from crises stabilization, medication management, a therapeutic milieu and referral to services.  Philip [name], MSW Intern 12/19/2018 12:15 PM

## 2018-12-19 NOTE — [facility] Suicide Risk Assessment (Signed)
[facility] Admission Suicide Risk Assessment   Nursing information obtained from:    Demographic factors:    Current Mental Status:    Loss Factors:    Historical Factors:    Risk Reduction Factors:     Total Time spent with patient: 45 minutes Principal Problem: <principal problem not specified> Diagnosis:  Active Problems:   Schizoaffective disorder (HCC)  Subjective Data: reports noncompliance since incarceration  Continued Clinical Symptoms:    The "Alcohol Use Disorders Identification Test", Guidelines for Use in Primary Care, Second Edition.  World Science writer [facility]). Score between 0-7:  no or low risk or alcohol related problems. Score between 8-15:  moderate risk of alcohol related problems. Score between 16-19:  high risk of alcohol related problems. Score 20 or above:  warrants further diagnostic evaluation for alcohol dependence and treatment.   CLINICAL FACTORS:   Dysthymia   COGNITIVE FEATURES THAT CONTRIBUTE TO RISK:  Loss of executive function    SUICIDE RISK:   Minimal: No identifiable suicidal ideation.  Patients presenting with no risk factors but with morbid ruminations; may be classified as minimal risk based on the severity of the depressive symptoms  PLAN OF CARE: admit for eval-  I certify that inpatient services furnished can reasonably be expected to improve the patient's condition.   Malvin Johns, MD 12/19/2018, 1:38 PM

## 2018-12-19 NOTE — ED Notes (Addendum)
Pt informed he is going to [facility]. He is verbally aggressive and screaming in RN's face that he is not going and has to go to work and that he is tranquilized . RN called security and [facility] to let her know changes in behavior since finding out he is going to [facility]. Charge nurse called.

## 2018-12-19 NOTE — Progress Notes (Signed)
The patient rated his day as a 9 out of 10. He credited his positive day to meeting his new peers and because he is feeling better as a result of taking his medicine. His goal for tomorrow is to "stay stabilized".

## 2018-12-19 NOTE — ED Notes (Signed)
Patient is resting comfortably. 

## 2018-12-19 NOTE — ED Provider Notes (Signed)
28 year old male here with aggressive behavior.  Patient has been medically cleared he is currently accepted at behavioral health and will be taken at this time.  He is up walking at the time my evaluation in no acute distress.  Vitals:   12/19/18 0621 12/19/18 0932  BP: (!) 120/98 126/76  Pulse: 63 62  Resp: 18 18  Temp: 98.7 F (37.1 C) 98.4 F (36.9 C)  SpO2: 99% 99%      Eyvonne Mechanic, PA-C 12/19/18 9024    Geoffery Lyons, MD 12/19/18 [phone]

## 2018-12-19 NOTE — ED Notes (Signed)
RN called [employer] for transportation to [facility].

## 2018-12-19 NOTE — [facility] Group Notes (Signed)
B[facility] LCSW Group Therapy Note  Date/Time: 12/19/18, 1315  Type of Therapy/Topic:  Group Therapy:  Balance in Life  Participation Level:  Did not attend  Description of Group:    This group will address the concept of balance and how it feels and looks when one is unbalanced. Patients will be encouraged to process areas in their lives that are out of balance, and identify reasons for remaining unbalanced. Facilitators will guide patients utilizing problem- solving interventions to address and correct the stressor making their life unbalanced. Understanding and applying boundaries will be explored and addressed for obtaining  and maintaining a balanced life. Patients will be encouraged to explore ways to assertively make their unbalanced needs known to significant others in their lives, using other group members and facilitator for support and feedback.  Therapeutic Goals: 1. Patient will identify two or more emotions or situations they have that consume much of in their lives. 2. Patient will identify signs/triggers that life has become out of balance:  3. Patient will identify two ways to set boundaries in order to achieve balance in their lives:  4. Patient will demonstrate ability to communicate their needs through discussion and/or role plays  Summary of Patient Progress:          Therapeutic Modalities:   Cognitive Behavioral Therapy Solution-Focused Therapy Assertiveness Training  Greg Jovaun Levene, LCSW 

## 2018-12-19 NOTE — Progress Notes (Signed)
Pt attended wrap-up group. Pt appears animated/anxious/silly in affect and mood. Pt denies SI/HI/AVH/Pain at this time. Pt is restless and observe pacing the hallway often. Pt take meds as prescribed. No behavioral issues noted. Support offered. Will continue with POC.

## 2018-12-19 NOTE — ED Notes (Signed)
Report given to [place], Charity fundraiser at [facility]

## 2018-12-19 NOTE — ED Notes (Signed)
Pt going to [facility] to [facility] with [employer]. They have his IVC paperwork.

## 2018-12-19 NOTE — Progress Notes (Signed)
Recreation Therapy Notes  INPATIENT RECREATION THERAPY ASSESSMENT  Patient Details Name: Philip [name] MRN: [id number] DOB: 26-Dec-1990 Today's Date: 12/19/2018       Information Obtained From: Patient  Able to Participate in Assessment/Interview: Yes  Patient Presentation: Alert  Reason for Admission (Per Patient): Other (Comments)(Pt stated the police committed him)  Patient Stressors: Other (Comment)(Police)  Coping Skills:   Isolation, TV, Sports, Arguments, Aggression, Music, Exercise, Meditate, Deep Breathing, Substance Abuse, Impulsivity, Talk, Prayer, Art, Avoidance, Read, Hot Bath/Shower  Leisure Interests (2+):  Sports - Basketball  Frequency of Recreation/Participation: Weekly  Awareness of Community Resources:  Yes  Community Resources:  Park, [employer], Other (Comment)(Stores)  Current Use: Yes  If no, Barriers?:    Expressed Interest in [employer] Information: No  [employer] of Residence:  [place]  Patient Main Form of Transportation: Set designer  Patient Strengths:  Haematologist; Good at selling things  Patient Identified Areas of Improvement:  Anger; Trusting people  Patient Goal for Hospitalization:  "equalize medications so I won't be trippin'".  Current SI (including self-harm):  No  Current HI:  No  Current AVH: No  Staff Intervention Plan: Group Attendance, Collaborate with Interdisciplinary Treatment Team  Consent to Intern Participation: N/A    Caroll Rancher, LRT/CTRS  Lillia Abed, Fischer Halley A 12/19/2018, 1:10 PM

## 2018-12-19 NOTE — ED Notes (Signed)
Breakfast Tray ordered  

## 2018-12-19 NOTE — ED Notes (Signed)
[employer] at bedside and security to assist with injection. Pt was cooperative for injection pulled down pants and did not resist.

## 2018-12-19 NOTE — H&P (Signed)
Psychiatric Admission Assessment Adult  Patient Identification: Philip [name] MRN:  [id number] Date of Evaluation:  12/19/2018 Chief Complaint:  SCHIZOAFFECTIVE  Principal Diagnosis: <principal problem not specified> Diagnosis:  Active Problems:   Schizoaffective disorder (HCC)  History of Present Illness:   Mr. Clover is 28 years of age and this is the latest of multiple admissions/encounters for him in mental health systems he presented seeking to be reinstituted on his psychotropic medications, states that help with anger and he also endorses paranoia, believes he is targeted by law enforcement for no specific reason however he was indeed recently incarcerated and states he has been off his meds since release. Patient actually presented yesterday in custody of police he was breaking out windows and threatening to kill himself and even was delusional stated he was seeing things, believed he was dating a nurse at the hospital which is not true made numerous other bizarre and delusional statements. 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 States he is considering giving it up and this may be fueling his pathology he denies other drugs of abuse Required Haldol and other medications emergency department and this is helped to reorient him despite the trauma of his recent destruction of property and psychosis. Last inpatient and November 2015 at this institution, patient lives alone and apparently his mother has suffered from vascular event and is in hospital care. Previous court and legal issues include possession of marijuana and carrying a concealed weapon without a permit he has a court date in March regarding that. Despite again the drama and issues listed he is actually very cooperative and pleasant on exam stating he knows he needs to be on medications and knows he needs to be compliant with them   Associated Signs/Symptoms: Depression  Symptoms:  fatigue, (Hypo) Manic Symptoms:  Impulsivity, Anxiety Symptoms:  n/a Psychotic Symptoms:  Delusions, PTSD Symptoms: NA Total Time spent with patient: 45 minutes  Is the patient at risk to self? Yes.    Has the patient been a risk to self in the past 6 months? No.  Has the patient been a risk to self within the distant past? No.  Is the patient a risk to others? Yes.    Has the patient been a risk to others in the past 6 months? No.  Has the patient been a risk to others within the distant past? No.     Alcohol Screening:   Substance Abuse History in the last 12 months:  Yes.   Consequences of Substance Abuse: Medical Consequences:  Exacerbating and/or causing underlying schizoaffective disorder Previous Psychotropic Medications: Yes  Psychological Evaluations: No  Past Medical History:  Past Medical History:  Diagnosis Date  . ADHD (attention deficit hyperactivity disorder)   . Asthma   . Bipolar affective disorder (HCC)   . Schizophrenia [facility])     Past Surgical History:  Procedure Laterality Date  . NO PAST SURGERIES     Family History:  Family History  Problem Relation Age of Onset  . Hypertension Mother     Tobacco Screening:   Social History:  Social History   Substance and Sexual Activity  Alcohol Use Yes  . Alcohol/week: 1.0 standard drinks  . Types: 1 Cans of beer per week     Social History   Substance and Sexual Activity  Drug Use Yes  . Types: Marijuana    Additional Social History: Marital status: Single Are you sexually active?: Yes What is  your sexual orientation?: Heterosexual Has your sexual activity been affected by drugs, alcohol, medication, or emotional stress?: None reported Does patient have children?: No                         Allergies:   Allergies  Allergen Reactions  . Shrimp [Shellfish Allergy] Anaphylaxis  . Pork-Derived Products     Does not eat it   Lab Results:  Results for orders placed or  performed during the hospital encounter of 12/18/18 (from the past 48 hour(s))  Rapid urine drug screen (hospital performed)     Status: Abnormal   Collection Time: 12/18/18 11:14 AM  Result Value Ref Range   Opiates NONE DETECTED NONE DETECTED   Cocaine NONE DETECTED NONE DETECTED   Benzodiazepines NONE DETECTED NONE DETECTED   Amphetamines NONE DETECTED NONE DETECTED   Tetrahydrocannabinol POSITIVE (A) NONE DETECTED   Barbiturates NONE DETECTED NONE DETECTED    Comment: (NOTE) DRUG SCREEN FOR MEDICAL PURPOSES ONLY.  IF CONFIRMATION IS NEEDED FOR ANY PURPOSE, NOTIFY LAB WITHIN 5 DAYS. LOWEST DETECTABLE LIMITS FOR URINE DRUG SCREEN Drug Class                     Cutoff (ng/mL) Amphetamine and metabolites    1000 Barbiturate and metabolites    200 Benzodiazepine                 200 Tricyclics and metabolites     300 Opiates and metabolites        300 Cocaine and metabolites        300 THC                            50 Performed at [facility], [address] [place], [place] [address]   Comprehensive metabolic panel     Status: None   Collection Time: 12/18/18 11:18 AM  Result Value Ref Range   Sodium 140 135 - 145 mmol/L   Potassium 4.1 3.5 - 5.1 mmol/L   Chloride 109 98 - 111 mmol/L   CO2 25 22 - 32 mmol/L   Glucose, Bld 95 70 - 99 mg/dL   BUN 8 6 - 20 mg/dL   Creatinine, Ser 6.211.09 0.61 - 1.24 mg/dL   Calcium 9.3 8.9 - 30.810.3 mg/dL   Total Protein 6.5 6.5 - 8.1 g/dL   Albumin 3.8 3.5 - 5.0 g/dL   AST 37 15 - 41 U/L   ALT 25 0 - 44 U/L   Alkaline Phosphatase 64 38 - 126 U/L   Total Bilirubin 0.8 0.3 - 1.2 mg/dL   GFR calc non Af Amer >60 >60 mL/min   GFR calc Af Amer >60 >60 mL/min   Anion gap 6 5 - 15    Comment: Performed at [facility], [address] [place], [place] [address]  Ethanol     Status: None   Collection Time: 12/18/18 11:18 AM  Result Value Ref Range   Alcohol, Ethyl (B) <10 <10 mg/dL    Comment: (NOTE) Lowest detectable limit for  serum alcohol is 10 mg/dL. For medical purposes only. Performed at [facility], [address] [place], [place] [address]   Salicylate level     Status: None   Collection Time: 12/18/18 11:18 AM  Result Value Ref Range   Salicylate Lvl <7.0 2.8 - 30.0 mg/dL  Comment: Performed at [facility], [address] [place], [place] [address]  Acetaminophen level     Status: Abnormal   Collection Time: 12/18/18 11:18 AM  Result Value Ref Range   Acetaminophen (Tylenol), Serum <10 (L) 10 - 30 ug/mL    Comment: (NOTE) Therapeutic concentrations vary significantly. A range of 10-30 ug/mL  may be an effective concentration for many patients. However, some  are best treated at concentrations outside of this range. Acetaminophen concentrations >150 ug/mL at 4 hours after ingestion  and >50 ug/mL at 12 hours after ingestion are often associated with  toxic reactions. Performed at [facility], [address] [place], [place] [address]   cbc     Status: Abnormal   Collection Time: 12/18/18 11:18 AM  Result Value Ref Range   WBC 6.9 4.0 - 10.5 K/uL   RBC 4.16 (L) 4.22 - 5.81 MIL/uL   Hemoglobin 11.5 (L) 13.0 - 17.0 g/dL   HCT 38.3 (L) 29.1 - 91.6 %   MCV 88.2 80.0 - 100.0 fL   MCH 27.6 26.0 - 34.0 pg   MCHC 31.3 30.0 - 36.0 g/dL   RDW 60.6 00.4 - 59.9 %   Platelets 364 150 - 400 K/uL   nRBC 0.0 0.0 - 0.2 %    Comment: Performed at [facility], [address], [place], [place] [address]    Blood Alcohol level:  Lab Results  Component Value Date   ETH <10 12/18/2018   ETH <10 12/04/2018    Metabolic Disorder Labs:  Lab Results  Component Value Date   HGBA1C 5.5 09/03/2014   MPG 111 09/03/2014   No results found for: PROLACTIN Lab Results  Component Value Date   CHOL 112 09/03/2014   TRIG 65 09/03/2014   HDL 32 (L) 09/03/2014   CHOLHDL 3.5 09/03/2014   VLDL 13 09/03/2014   LDLCALC 67 09/03/2014    Current Medications: Current  Facility-Administered Medications  Medication Dose Route Frequency Provider Last Rate Last Dose  . acetaminophen (TYLENOL) tablet 650 mg  650 mg Oral Q6H PRN Malvin Johns, MD      . alum & mag hydroxide-simeth (MAALOX/MYLANTA) 200-200-20 MG/5ML suspension 30 mL  30 mL Oral Q4H PRN Malvin Johns, MD      . carbamazepine (TEGRETOL XR) 12 hr tablet 200 mg  200 mg Oral BID Malvin Johns, MD      . hydrOXYzine (ATARAX/VISTARIL) tablet 50 mg  50 mg Oral TID PRN Malvin Johns, MD      . magnesium hydroxide (MILK OF MAGNESIA) suspension 30 mL  30 mL Oral Daily PRN Malvin Johns, MD      . mirtazapine (REMERON SOL-TAB) disintegrating tablet 15 mg  15 mg Oral QHS Malvin Johns, MD      . OLANZapine [facility]) tablet 10 mg  10 mg Oral BID Malvin Johns, MD       PTA Medications: Medications Prior to Admission  Medication Sig Dispense Refill Last Dose  . hydrOXYzine (ATARAX/VISTARIL) 50 MG tablet Take 1 tablet (50 mg total) by mouth 3 (three) times daily. (Patient not taking: Reported on 12/18/2018) 21 tablet 0 Not Taking at Unknown time  . lamoTRIgine (LAMICTAL) 200 MG tablet Take 1 tablet (200 mg total) by mouth every evening. (Patient not taking: Reported on 12/18/2018) 30 tablet 0 Not Taking at Unknown time  . mirtazapine (REMERON) 15 MG tablet Take 1 tablet (15 mg total) by mouth at bedtime. (Patient not taking: Reported on  12/18/2018) 30 tablet 0 Not Taking at Unknown time  . OLANZapine (ZYPREXA) 20 MG tablet Take 1 tablet (20 mg total) by mouth every evening. (Patient not taking: Reported on 12/18/2018) 30 tablet 0 Not Taking at Unknown time    Musculoskeletal: Strength & Muscle Tone: within normal limits Gait & Station: normal Patient leans: N/A  Psychiatric Specialty Exam: Physical Exam  ROS  There were no vitals taken for this visit.There is no height or weight on file to calculate BMI.  General Appearance: Casual  Eye Contact:  Good  Speech:  Clear and Coherent  Volume:  Normal  Mood:  Dysphoric   Affect:  Appropriate  Thought Process:  Goal Directed  Orientation:  x3  Thought Content:  Logical  Suicidal Thoughts:  No  Homicidal Thoughts:  No  Memory:  Immediate;   Good  Judgement:  Good  Insight:  Good  Psychomotor Activity:  Normal  Concentration:  Concentration: Good  Recall:  Good  Fund of Knowledge:  Good  Language:  Good  Akathisia:  Negative  Handed:  Right  AIMS (if indicated):     Assets:  Physical Health Resilience Social Support  ADL's:  Intact  Cognition:  WNL  Sleep:       Treatment Plan Summary: Daily contact with patient to assess and evaluate symptoms and progress in treatment, Medication management and Plan Admit reinstitute meds  Observation Level/Precautions:  15 minute checks  Laboratory:  UDS  Psychotherapy: Reality based  Medications: Substitute carbamazepine for lamotrigine better mood stabilization properties  Consultations: Not necessary  Discharge Concerns: Longer-term compliance  Estimated LOS: 5-7  Other: Axis I schizoaffective disorder bipolar type, chronic cannabis dependency Axis II antisocial traits Axis III medically stable   Physician Treatment Plan for Primary Diagnosis: <principal problem not specified> Long Term Goal(s): Improvement in symptoms so as ready for discharge  Short Term Goals: Ability to demonstrate self-control will improve  Physician Treatment Plan for Secondary Diagnosis: Active Problems:   Schizoaffective disorder (HCC)  Long Term Goal(s): Improvement in symptoms so as ready for discharge  Short Term Goals: Compliance with prescribed medications will improve  I certify that inpatient services furnished can reasonably be expected to improve the patient's condition.    Malvin Johns, MD 2/20/20201:42 PM

## 2018-12-19 NOTE — ED Notes (Addendum)
Philip [name], [name]  Assessed pt before discharge to [facility]; property and paperwork given to [employer]. Pt ambulated with steady gait into wheelchair- warm blankets and socks given

## 2018-12-19 NOTE — Progress Notes (Signed)
Pt accepted to [facility], Bed 502-2  Nelly Rout, MD is the accepting provider.  Malvin Johns  is the attending provider.  Call report to [phone]  [facility] ED notified.   Pt is IVC  Pt may be transported by [employer] Pt scheduled  to arrive at [facility] as soon as transport can be arranged.  Timmothy Euler. Kaylyn Lim, MSW, LCSWA Disposition Clinical Social Work [phone] (cell) [phone] (office)

## 2018-12-20 DIAGNOSIS — F250 Schizoaffective disorder, bipolar type: Principal | ICD-10-CM

## 2018-12-20 MED ORDER — ATOMOXETINE HCL 10 MG PO CAPS
10.0000 mg | ORAL_CAPSULE | Freq: Every day | ORAL | Status: DC
  Administered 2018-12-20 – 2018-12-24 (×5): 10 mg via ORAL
  Filled 2018-12-20 (×6): qty 1

## 2018-12-20 NOTE — Progress Notes (Signed)
[facility] MD Progress Note  12/20/2018 8:09 AM Philip [name]  MRN:  [id number] Subjective:  Patient still engaging reports he has no thoughts of harming himself today but wants to stay hospitalized through the weekend to get his medication straight seek stimulants not indicated though he is alert oriented to person place situation generally pleasant dictating care to some extent again no thoughts of harming self or others at this point in time no auditory visual loose [place] calm and coherent and rational today no EPS or TD Principal Problem: Schizoaffective by history Diagnosis: Active Problems:   Schizoaffective disorder (HCC)  Total Time spent with patient: 20 minutes  Past Medical History:  Past Medical History:  Diagnosis Date  . ADHD (attention deficit hyperactivity disorder)   . Asthma   . Bipolar affective disorder (HCC)   . Schizophrenia [facility]     Past Surgical History:  Procedure Laterality Date  . NO PAST SURGERIES     Family History:  Family History  Problem Relation Age of Onset  . Hypertension Mother     Social History:  Social History   Substance and Sexual Activity  Alcohol Use Yes  . Alcohol/week: 1.0 standard drinks  . Types: 1 Cans of beer per week     Social History   Substance and Sexual Activity  Drug Use Yes  . Types: Marijuana    Social History   Socioeconomic History  . Marital status: Married    Spouse name: Not on file  . Number of children: Not on file  . Years of education: Not on file  . Highest education level: Not on file  Occupational History  . Not on file  Social Needs  . Financial resource strain: Not on file  . Food insecurity:    Worry: Not on file    Inability: Not on file  . Transportation needs:    Medical: Not on file    Non-medical: Not on file  Tobacco Use  . Smoking status: Current Every Day Smoker    Packs/day: 1.00    Years: 6.00    Pack years: 6.00    Types: Cigarettes  . Smokeless tobacco: Never Used  .  Tobacco comment: unable to assess  Substance and Sexual Activity  . Alcohol use: Yes    Alcohol/week: 1.0 standard drinks    Types: 1 Cans of beer per week  . Drug use: Yes    Types: Marijuana  . Sexual activity: Yes    Comment: unable to access  Lifestyle  . Physical activity:    Days per week: Not on file    Minutes per session: Not on file  . Stress: Not on file  Relationships  . Social connections:    Talks on phone: Not on file    Gets together: Not on file    Attends religious service: Not on file    Active member of club or organization: Not on file    Attends meetings of clubs or organizations: Not on file    Relationship status: Not on file  Other Topics Concern  . Not on file  Social History Narrative  . Not on file   Additional Social History:                         Sleep: Fair  Appetite:  Fair  Current Medications: Current Facility-Administered Medications  Medication Dose Route Frequency Provider Last Rate Last Dose  . acetaminophen (TYLENOL) tablet 650 mg  650 mg Oral Q6H PRN Malvin Johns, MD      . alum & mag hydroxide-simeth (MAALOX/MYLANTA) 200-200-20 MG/5ML suspension 30 mL  30 mL Oral Q4H PRN Malvin Johns, MD      . carbamazepine (TEGRETOL XR) 12 hr tablet 200 mg  200 mg Oral BID Malvin Johns, MD   200 mg at 12/20/18 0753  . hydrOXYzine (ATARAX/VISTARIL) tablet 50 mg  50 mg Oral TID PRN Malvin Johns, MD      . magnesium hydroxide (MILK OF MAGNESIA) suspension 30 mL  30 mL Oral Daily PRN Malvin Johns, MD      . mirtazapine (REMERON SOL-TAB) disintegrating tablet 15 mg  15 mg Oral QHS Malvin Johns, MD   15 mg at 12/19/18 2047  . OLANZapine (ZYPREXA) tablet 10 mg  10 mg Oral BID Malvin Johns, MD   10 mg at 12/20/18 2956    Lab Results:  Results for orders placed or performed during the hospital encounter of 12/18/18 (from the past 48 hour(s))  Rapid urine drug screen (hospital performed)     Status: Abnormal   Collection Time: 12/18/18 11:14  AM  Result Value Ref Range   Opiates NONE DETECTED NONE DETECTED   Cocaine NONE DETECTED NONE DETECTED   Benzodiazepines NONE DETECTED NONE DETECTED   Amphetamines NONE DETECTED NONE DETECTED   Tetrahydrocannabinol POSITIVE (A) NONE DETECTED   Barbiturates NONE DETECTED NONE DETECTED    Comment: (NOTE) DRUG SCREEN FOR MEDICAL PURPOSES ONLY.  IF CONFIRMATION IS NEEDED FOR ANY PURPOSE, NOTIFY LAB WITHIN 5 DAYS. LOWEST DETECTABLE LIMITS FOR URINE DRUG SCREEN Drug Class                     Cutoff (ng/mL) Amphetamine and metabolites    1000 Barbiturate and metabolites    200 Benzodiazepine                 200 Tricyclics and metabolites     300 Opiates and metabolites        300 Cocaine and metabolites        300 THC                            50 Performed at [facility] Lab, [address] [place], [place] [address]   Comprehensive metabolic panel     Status: None   Collection Time: 12/18/18 11:18 AM  Result Value Ref Range   Sodium 140 135 - 145 mmol/L   Potassium 4.1 3.5 - 5.1 mmol/L   Chloride 109 98 - 111 mmol/L   CO2 25 22 - 32 mmol/L   Glucose, Bld 95 70 - 99 mg/dL   BUN 8 6 - 20 mg/dL   Creatinine, Ser 6.57 0.61 - 1.24 mg/dL   Calcium 9.3 8.9 - 84.6 mg/dL   Total Protein 6.5 6.5 - 8.1 g/dL   Albumin 3.8 3.5 - 5.0 g/dL   AST 37 15 - 41 U/L   ALT 25 0 - 44 U/L   Alkaline Phosphatase 64 38 - 126 U/L   Total Bilirubin 0.8 0.3 - 1.2 mg/dL   GFR calc non Af Amer >60 >60 mL/min   GFR calc Af Amer >60 >60 mL/min   Anion gap 6 5 - 15    Comment: Performed at [facility], [address], [place], [place] [address]  Ethanol     Status: None   Collection Time: 12/18/18 11:18 AM  Result Value Ref Range   Alcohol, Ethyl (B) <10 <10 mg/dL    Comment: (NOTE) Lowest detectable limit for serum alcohol is 10 mg/dL. For medical purposes only. Performed at [facility], [address], [place], [place] [address]   Salicylate level     Status: None   Collection  Time: 12/18/18 11:18 AM  Result Value Ref Range   Salicylate Lvl <7.0 2.8 - 30.0 mg/dL    Comment: Performed at [facility] Lab, [address], [place], [place] [address]  Acetaminophen level     Status: Abnormal   Collection Time: 12/18/18 11:18 AM  Result Value Ref Range   Acetaminophen (Tylenol), Serum <10 (L) 10 - 30 ug/mL    Comment: (NOTE) Therapeutic concentrations vary significantly. A range of 10-30 ug/mL  may be an effective concentration for many patients. However, some  are best treated at concentrations outside of this range. Acetaminophen concentrations >150 ug/mL at 4 hours after ingestion  and >50 ug/mL at 12 hours after ingestion are often associated with  toxic reactions. Performed at [facility], [address], [place], [place] [address]   cbc     Status: Abnormal   Collection Time: 12/18/18 11:18 AM  Result Value Ref Range   WBC 6.9 4.0 - 10.5 K/uL   RBC 4.16 (L) 4.22 - 5.81 MIL/uL   Hemoglobin 11.5 (L) 13.0 - 17.0 g/dL   HCT 21.3 (L) 08.6 - 57.8 %   MCV 88.2 80.0 - 100.0 fL   MCH 27.6 26.0 - 34.0 pg   MCHC 31.3 30.0 - 36.0 g/dL   RDW 46.9 62.9 - 52.8 %   Platelets 364 150 - 400 K/uL   nRBC 0.0 0.0 - 0.2 %    Comment: Performed at [facility], [address], [place], [place] [address]    Blood Alcohol level:  Lab Results  Component Value Date   ETH <10 12/18/2018   ETH <10 12/04/2018    Metabolic Disorder Labs: Lab Results  Component Value Date   HGBA1C 5.5 09/03/2014   MPG 111 09/03/2014   No results found for: PROLACTIN Lab Results  Component Value Date   CHOL 112 09/03/2014   TRIG 65 09/03/2014   HDL 32 (L) 09/03/2014   CHOLHDL 3.5 09/03/2014   VLDL 13 09/03/2014   LDLCALC 67 09/03/2014    Physical Findings: AIMS:  , ,  ,  ,    CIWA:    COWS:     Musculoskeletal: Strength & Muscle Tone: within normal limits Gait & Station: normal Patient leans: N/A  Psychiatric Specialty Exam: Physical Exam  ROS  Blood  pressure 122/85, pulse 75, temperature 98.5 F (36.9 C), temperature source Oral, resp. rate 18, height 5\' 10"  (1.778 m), weight 83.9 kg, SpO2 100 %.Body mass index is 26.54 kg/m.  General Appearance: Casual  Eye Contact:  Good  Speech:  Clear and Coherent  Volume:  Normal  Mood:  Euthymic  Affect:  Appropriate  Thought Process:  Coherent  Orientation:  Full (Time, Place, and Person)  Thought Content:  Tangential  Suicidal Thoughts:  No  Homicidal Thoughts:  No  Memory:  Immediate;   Good  Judgement:  Good  Insight:  Good  Psychomotor Activity:  Normal  Concentration:  Concentration: Good  Recall:  Good  Fund of Knowledge:  Good  Language:  Good  Akathisia:  Negative  Handed:  Right  AIMS (if indicated):     Assets:  Resilience Social Support  ADL's:  Intact  Cognition:  WNL  Sleep:  Number of Hours: 6     Treatment Plan Summary: Daily contact with patient to assess and evaluate symptoms and progress in treatment, Medication management and Plan Continue to manage through the weekend no med changes today will need housing most likely continue current 15-minute precautions  Meeyah Ovitt, MD 12/20/2018, 8:09 AM

## 2018-12-20 NOTE — [facility] Group Notes (Signed)
Date: 12/20/18, 1300  Type of Therapy and Topic: Chaplain group, "Hope is.." Chaplain engaged group in discussion about hope and what it looks like in each patients life and current situation.  Participation level:Did not attend  Modes of Intervention: Discussion, Education and Socialization  Summary of Progress/Problems:   Laterrica Libman Jon, LCSW   [facility] LCSW Group Therapy Note 

## 2018-12-20 NOTE — [facility] Group Notes (Signed)
Adult Psychoeducational Group Note  Date:  12/20/2018 Time:  9:22 AM  Group Topic/Focus:  Orientation:   The focus of this group is to educate the patient on the purpose and policies of crisis stabilization and provide a format to answer questions about their admission.  The group details unit policies and expectations of patients while admitted.  Participation Level:  Active  Participation Quality:  Appropriate  Affect:  Appropriate  Cognitive:  Alert  Insight: Appropriate  Engagement in Group:  Engaged  Modes of Intervention:  Orientation  Additional Comments:  Pt participated in group activity.   Dellia Nims 12/20/2018, 9:22 AM

## 2018-12-20 NOTE — Tx Team (Signed)
Interdisciplinary Treatment and Diagnostic Plan Update  12/20/2018 Time of Session: [facility] MRN: [id number]  Principal Diagnosis: <principal problem not specified>  Secondary Diagnoses: Active Problems:   Schizoaffective disorder (HCC)   Current Medications:  Current Facility-Administered Medications  Medication Dose Route Frequency Provider Last Rate Last Dose  . acetaminophen (TYLENOL) tablet 650 mg  650 mg Oral Q6H PRN Johnn Hai, MD      . alum & mag hydroxide-simeth (MAALOX/MYLANTA) 200-200-20 MG/5ML suspension 30 mL  30 mL Oral Q4H PRN Johnn Hai, MD      . atomoxetine (STRATTERA) capsule 10 mg  10 mg Oral Daily Johnn Hai, MD   10 mg at 12/20/18 1038  . carbamazepine (TEGRETOL XR) 12 hr tablet 200 mg  200 mg Oral BID Johnn Hai, MD   200 mg at 12/20/18 0753  . hydrOXYzine (ATARAX/VISTARIL) tablet 50 mg  50 mg Oral TID PRN Johnn Hai, MD      . magnesium hydroxide (MILK OF MAGNESIA) suspension 30 mL  30 mL Oral Daily PRN Johnn Hai, MD      . mirtazapine (REMERON SOL-TAB) disintegrating tablet 15 mg  15 mg Oral QHS Johnn Hai, MD   15 mg at 12/19/18 2047  . OLANZapine (ZYPREXA) tablet 10 mg  10 mg Oral BID Johnn Hai, MD   10 mg at 12/20/18 8264   PTA Medications: Medications Prior to Admission  Medication Sig Dispense Refill Last Dose  . hydrOXYzine (ATARAX/VISTARIL) 50 MG tablet Take 1 tablet (50 mg total) by mouth 3 (three) times daily. (Patient not taking: Reported on 12/18/2018) 21 tablet 0 Not Taking at Unknown time  . lamoTRIgine (LAMICTAL) 200 MG tablet Take 1 tablet (200 mg total) by mouth every evening. (Patient not taking: Reported on 12/18/2018) 30 tablet 0 Not Taking at Unknown time  . mirtazapine (REMERON) 15 MG tablet Take 1 tablet (15 mg total) by mouth at bedtime. (Patient not taking: Reported on 12/18/2018) 30 tablet 0 Not Taking at Unknown time  . OLANZapine (ZYPREXA) 20 MG tablet Take 1 tablet (20 mg total) by mouth every evening.  (Patient not taking: Reported on 12/18/2018) 30 tablet 0 Not Taking at Unknown time    Patient Stressors:    Patient Strengths:    Treatment Modalities: Medication Management, Group therapy, Case management,  1 to 1 session with clinician, Psychoeducation, Recreational therapy.   Physician Treatment Plan for Primary Diagnosis: <principal problem not specified> Long Term Goal(s): Improvement in symptoms so as ready for discharge Improvement in symptoms so as ready for discharge   Short Term Goals: Ability to demonstrate self-control will improve Compliance with prescribed medications will improve  Medication Management: Evaluate patient's response, side effects, and tolerance of medication regimen.  Therapeutic Interventions: 1 to 1 sessions, Unit Group sessions and Medication administration.  Evaluation of Outcomes: Not Met  Physician Treatment Plan for Secondary Diagnosis: Active Problems:   Schizoaffective disorder ([place])  Long Term Goal(s): Improvement in symptoms so as ready for discharge Improvement in symptoms so as ready for discharge   Short Term Goals: Ability to demonstrate self-control will improve Compliance with prescribed medications will improve     Medication Management: Evaluate patient's response, side effects, and tolerance of medication regimen.  Therapeutic Interventions: 1 to 1 sessions, Unit Group sessions and Medication administration.  Evaluation of Outcomes: Not Met   RN Treatment Plan for Primary Diagnosis: <principal problem not specified> Long Term Goal(s): Knowledge of disease and therapeutic regimen to maintain health will improve  Short  Term Goals: Ability to identify and develop effective coping behaviors will improve and Compliance with prescribed medications will improve  Medication Management: RN will administer medications as ordered by provider, will assess and evaluate patient's response and provide education to patient for prescribed  medication. RN will report any adverse and/or side effects to prescribing provider.  Therapeutic Interventions: 1 on 1 counseling sessions, Psychoeducation, Medication administration, Evaluate responses to treatment, Monitor vital signs and CBGs as ordered, Perform/monitor CIWA, COWS, AIMS and Fall Risk screenings as ordered, Perform wound care treatments as ordered.  Evaluation of Outcomes: Not Met   LCSW Treatment Plan for Primary Diagnosis: <principal problem not specified> Long Term Goal(s): Safe transition to appropriate next level of care at discharge, Engage patient in therapeutic group addressing interpersonal concerns.  Short Term Goals: Engage patient in aftercare planning with referrals and resources, Increase social support and Increase skills for wellness and recovery  Therapeutic Interventions: Assess for all discharge needs, 1 to 1 time with Social worker, Explore available resources and support systems, Assess for adequacy in community support network, Educate family and significant other(s) on suicide prevention, Complete Psychosocial Assessment, Interpersonal group therapy.  Evaluation of Outcomes: Not Met   Progress in Treatment: Attending groups: No. Participating in groups: No. Taking medication as prescribed: Yes. Toleration medication: Yes. Family/Significant other contact made: No, will contact:  sister Patient understands diagnosis: No. Discussing patient identified problems/goals with staff: Yes. Medical problems stabilized or resolved: Yes. Denies suicidal/homicidal ideation: Yes. Issues/concerns per patient self-inventory: No. Other: none  New problem(s) identified: No, Describe:  none  New Short Term/Long Term Goal(s):  Patient Goals:  "get meds stabilized"  Discharge Plan or Barriers:   Reason for Continuation of Hospitalization: Delusions  Medication stabilization  Estimated Length of Stay:3-5 days  Attendees: Patient:Philip [name] 12/20/2018    Physician: Dr. Jake Samples, MD 12/20/2018   Nursing: Sena Hitch, RN 12/20/2018   RN Care Manager: 12/20/2018   Social Worker: Lurline Idol, LCSW 12/20/2018   Recreational Therapist:  12/20/2018   Other:  12/20/2018   Other:  12/20/2018   Other: 12/20/2018        Scribe for Treatment Team: Joanne Chars, [place] 12/20/2018 11:34 AM

## 2018-12-20 NOTE — [facility] Suicide Risk Assessment (Signed)
[facility] INPATIENT:  Family/Significant Other Suicide Prevention Education  Suicide Prevention Education:  Contact Attempts: sister Alden Benjamin [phone], has been identified by the patient as the family member/significant other with whom the patient will be residing, and identified as the person(s) who will aid the patient in the event of a mental health crisis.  With written consent from the patient, two attempts were made to provide suicide prevention education, prior to and/or following the patient's discharge.  We were unsuccessful in providing suicide prevention education.  A suicide education pamphlet was given to the patient to share with family/significant other.  Date and time of first attempt: 12/20/2018 3:15 PM   Marian Sorrow, MSW Intern 12/20/2018, 3:14 PM

## 2018-12-20 NOTE — Progress Notes (Signed)
Pt states he wants to be back on Adderall. Pt encourage to speak with MD.

## 2018-12-20 NOTE — Plan of Care (Signed)
  Problem: Activity: Goal: Interest or engagement in activities will improve Outcome: Progressing   Problem: Safety: Goal: Periods of time without injury will increase Outcome: Progressing  DAR NOTE: Patient presents with anxious affect and mood.  Denies pain, auditory and visual hallucinations.  Rates depression at 0, hopelessness at 0, and anxiety at 0.  Maintained on routine safety checks.  Medications given as prescribed.  Constantly asking for different stimulants.  Support and encouragement offered as needed.  Attended group and participated.  States goal for today is "stabilizing my medicines."  Patient observed socializing with peers in the dayroom.  Requested and received Tylenol 650 mg for complain of leg pain with good effect.  Patient is safe on and off the unit.

## 2018-12-21 MED ORDER — QUETIAPINE FUMARATE 50 MG PO TABS
50.0000 mg | ORAL_TABLET | Freq: Two times a day (BID) | ORAL | Status: DC
  Administered 2018-12-21 – 2018-12-22 (×3): 50 mg via ORAL
  Filled 2018-12-21 (×6): qty 1

## 2018-12-21 MED ORDER — QUETIAPINE FUMARATE 100 MG PO TABS
100.0000 mg | ORAL_TABLET | Freq: Every day | ORAL | Status: DC
  Administered 2018-12-21: 100 mg via ORAL
  Filled 2018-12-21 (×3): qty 1

## 2018-12-21 NOTE — Progress Notes (Signed)
D:  [place] was up and visible on the unit.  He was up less at the nurses station this evening.  He was pleasant and cooperative.  He reported having a good day and attended evening wrap up group.  He denied SI/HI or A/V hallucinations.  He took his hs medications without difficulty and voiced no questions or issues at this time.  He is currently resting with his eyes closed and appears to be asleep. A:  1:1 with RN for support and encouragement.  Medications as ordered.  Q 15 minute checks maintained for safety.  Encouraged participation in group and unit activities.   R:  [place] remains safe on the unit.  We will continue to monitor the progress towards his goals.

## 2018-12-21 NOTE — Progress Notes (Addendum)
[facility] MD Progress Note  12/21/2018 1:43 PM Philip [name]  MRN:  [id number] Subjective:  Philip [name]  " I think I am ready to go"  Evaluation: Philip [name] observed standing in day room interacting with peers.  Patient presents  hyper verbal, pressured rapid speech.  Was reported verbal altercation between [place] and his peer.  Jahrell is very apologetic for his behavior.  Discussed discontinuing Zyprexa and will initiate Seroquel as he reports taking Seroquel in the past and tolerating it well.  Will consider lithium.  Patient declined starting Risperdal  due to side effects reported on television. "  I do not want breast."  Reports a good appetite.  States he is resting well throughout the night.  Denies suicidal or homicidal ideations.  Denies auditory visual hallucinations.  Support encouragement reassurance was provided.   History: Per admission assessment: Philip [name] is 28 years of age and this is the latest of multiple admissions/encounters for him in mental health systems he presented seeking to be reinstituted on his psychotropic medications, states that help with anger and he also endorses paranoia, believes he is targeted by law enforcement for no specific reason however he was indeed recently incarcerated and states he has been off his meds since release.Patient actually presented yesterday in custody of police he was breaking out windows and threatening to kill himself and even was delusional stated he was seeing things, believed he was dating a nurse at the hospital which is not true made numerous other bizarre and delusional statements    Principal Problem: Schizoaffective by history Diagnosis: Active Problems:   Schizoaffective disorder (HCC)  Total Time spent with patient: 20 minutes  Past Medical History:  Past Medical History:  Diagnosis Date  . ADHD (attention deficit hyperactivity disorder)   . Asthma   . Bipolar affective disorder (HCC)   . Schizophrenia [facility])     Past Surgical  History:  Procedure Laterality Date  . NO PAST SURGERIES     Family History:  Family History  Problem Relation Age of Onset  . Hypertension Mother     Social History:  Social History   Substance and Sexual Activity  Alcohol Use Yes  . Alcohol/week: 1.0 standard drinks  . Types: 1 Cans of beer per week     Social History   Substance and Sexual Activity  Drug Use Yes  . Types: Marijuana    Social History   Socioeconomic History  . Marital status: Married    Spouse name: Not on file  . Number of children: Not on file  . Years of education: Not on file  . Highest education level: Not on file  Occupational History  . Not on file  Social Needs  . Financial resource strain: Not on file  . Food insecurity:    Worry: Not on file    Inability: Not on file  . Transportation needs:    Medical: Not on file    Non-medical: Not on file  Tobacco Use  . Smoking status: Current Every Day Smoker    Packs/day: 1.00    Years: 6.00    Pack years: 6.00    Types: Cigarettes  . Smokeless tobacco: Never Used  . Tobacco comment: unable to assess  Substance and Sexual Activity  . Alcohol use: Yes    Alcohol/week: 1.0 standard drinks    Types: 1 Cans of beer per week  . Drug use: Yes    Types: Marijuana  . Sexual activity: Yes    Comment:  unable to access  Lifestyle  . Physical activity:    Days per week: Not on file    Minutes per session: Not on file  . Stress: Not on file  Relationships  . Social connections:    Talks on phone: Not on file    Gets together: Not on file    Attends religious service: Not on file    Active member of club or organization: Not on file    Attends meetings of clubs or organizations: Not on file    Relationship status: Not on file  Other Topics Concern  . Not on file  Social History Narrative  . Not on file   Additional Social History:                         Sleep: Fair  Appetite:  Fair  Current Medications: Current  Facility-Administered Medications  Medication Dose Route Frequency Provider Last Rate Last Dose  . acetaminophen (TYLENOL) tablet 650 mg  650 mg Oral Q6H PRN Malvin JohnsFarah, Brian, MD   650 mg at 12/20/18 1653  . alum & mag hydroxide-simeth (MAALOX/MYLANTA) 200-200-20 MG/5ML suspension 30 mL  30 mL Oral Q4H PRN Malvin JohnsFarah, Brian, MD      . atomoxetine (STRATTERA) capsule 10 mg  10 mg Oral Daily Malvin JohnsFarah, Brian, MD   10 mg at 12/21/18 16100811  . carbamazepine (TEGRETOL XR) 12 hr tablet 200 mg  200 mg Oral BID Malvin JohnsFarah, Brian, MD   200 mg at 12/21/18 0810  . hydrOXYzine (ATARAX/VISTARIL) tablet 50 mg  50 mg Oral TID PRN Malvin JohnsFarah, Brian, MD   50 mg at 12/21/18 0447  . magnesium hydroxide (MILK OF MAGNESIA) suspension 30 mL  30 mL Oral Daily PRN Malvin JohnsFarah, Brian, MD      . mirtazapine (REMERON SOL-TAB) disintegrating tablet 15 mg  15 mg Oral QHS Malvin JohnsFarah, Brian, MD   15 mg at 12/20/18 2114  . QUEtiapine (SEROQUEL) tablet 50 mg  50 mg Oral BID Oneta RackLewis, Tanika N, NP   50 mg at 12/21/18 1004   And  . QUEtiapine (SEROQUEL) tablet 100 mg  100 mg Oral QHS Oneta RackLewis, Tanika N, NP        Lab Results:  No results found for this or any previous visit (from the past 48 hour(s)).  Blood Alcohol level:  Lab Results  Component Value Date   ETH <10 12/18/2018   ETH <10 12/04/2018    Metabolic Disorder Labs: Lab Results  Component Value Date   HGBA1C 5.5 09/03/2014   MPG 111 09/03/2014   No results found for: PROLACTIN Lab Results  Component Value Date   CHOL 112 09/03/2014   TRIG 65 09/03/2014   HDL 32 (L) 09/03/2014   CHOLHDL 3.5 09/03/2014   VLDL 13 09/03/2014   LDLCALC 67 09/03/2014    Physical Findings: AIMS:  , ,  ,  ,    CIWA:    COWS:     Musculoskeletal: Strength & Muscle Tone: within normal limits Gait & Station: normal Patient leans: N/A  Psychiatric Specialty Exam: Physical Exam  Vitals reviewed. Psychiatric: He has a normal mood and affect. His behavior is normal.    Review of Systems   Psychiatric/Behavioral: The patient is nervous/anxious.   All other systems reviewed and are negative.   Blood pressure (!) 114/96, pulse 82, temperature 97.9 F (36.6 C), temperature source Oral, resp. rate 18, height 5\' 10"  (1.778 m), weight 83.9 kg, SpO2 100 %.Body mass index  is 26.54 kg/m.  General Appearance: Casual  Eye Contact:  Good  Speech:  Clear and Coherent  Volume:  Increased pressured   Mood:  Anxious  Affect:  Appropriate  Thought Process:  Coherent  Orientation:  Full (Time, Place, and Person)  Thought Content:  Paranoid Ideation and Tangential  Suicidal Thoughts:  No  Homicidal Thoughts:  No  Memory:  Immediate;   Good  Judgement:  Good  Insight:  Good  Psychomotor Activity:  Normal  Concentration:  Concentration: Good  Recall:  Good  Fund of Knowledge:  Good  Language:  Good  Akathisia:  Negative  Handed:  Right  AIMS (if indicated):     Assets:  Resilience Social Support  ADL's:  Intact  Cognition:  WNL  Sleep:  Number of Hours: 4.25     Treatment Plan Summary: Daily contact with patient to assess and evaluate symptoms and progress in treatment and Medication management   Continue with current treatment plan on 12/21/2018 as listed below except were noted  Mood stabilization:   Continue Tegretol 200 mg p.o. twice daily  Discontinued Zyprexa 10 mg p.o. twice daily  Initiated Seroquel 50 mg p.o. twice daily and 100 mg p.o. nightly  Continue Strattera 10 mg p.o. daily  Insomnia:  Continue Remeron 15 mg p.o. nightly  Orders placed for EKG 386 QT  CSW to continue working on discharge disposition. Patient encouraged to participate within the milieu   Oneta Rack, NP 12/21/2018, 1:43 PM   ..Agree with NP Progress Note
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
## 2018-12-21 NOTE — [facility] Group Notes (Signed)
[facility] Group Notes:  (Nursing/MHT/Case Management/Adjunct)  Date:  12/21/2018  Time:  6:17 PM   Type of Therapy:  Nurse Education  Participation Level:  Active  Participation Quality:  Appropriate, Attentive and Redirectable  Affect:  Excited  Cognitive:  Disorganized  Insight:  Good  Engagement in Group:  Engaged  Modes of Intervention:  Discussion and Education  Summary of Progress/Problems: In this group, we discussed anger management. First, we identified myths about anger. Then we explored the different coping skills to deal with anger. Finally, we talked about rating our anger and how to have a healthy, assertive conversation versus a hostile one.   Kirstie Mirza 12/21/2018, 6:17 PM

## 2018-12-21 NOTE — Plan of Care (Signed)
  Problem: Activity: Goal: Interest or engagement in activities will improve Outcome: Progressing   Problem: Coping: Goal: Ability to verbalize frustrations and anger appropriately will improve Outcome: Progressing   D: Pt alert and oriented on the unit. Pt engaging with RN staff and other pts. Pt denies SI/HI, A/VH. Pt became agitated this morning when another pt was talking and yelling loudly at staff at the nurses' station. Pt calmed and sat in the day room when RN staff processed with him. Pt is cooperative. Pt rated his depression, feelings of hopelessness, and anxiety all a 0, with 10 being the worst. Pt's goal for today is "not rushing things."  A: Education, support and encouragement provided, q15 minute safety checks remain in effect. Medications administered per MD orders.  R: No reactions/side effects to medicine noted. Pt denies any concerns at this time, and verbally contracts for safety. Pt ambulating on the unit with no issues. Pt remains safe on and off the unit.

## 2018-12-21 NOTE — [facility] Suicide Risk Assessment (Signed)
[facility] INPATIENT:  Family/Significant Other Suicide Prevention Education  Suicide Prevention Education:  Education Completed;  sister Alden Benjamin [phone],  (name of family member/significant other) has been identified by the patient as the family member/significant other with whom the patient will be residing, and identified as the person(s) who will aid the patient in the event of a mental health crisis (suicidal ideations/suicide attempt).  With written consent from the patient, the family member/significant other has been provided the following suicide prevention education, prior to the and/or following the discharge of the patient.  The suicide prevention education provided includes the following:  Suicide risk factors  Suicide prevention and interventions  National Suicide Hotline telephone number  [facility] assessment telephone number  [facility] Emergency Assistance 911  [facility] and/or Residential Mobile Crisis Unit telephone number  Request made of family/significant other to:  Remove weapons (e.g., guns, rifles, knives), all items previously/currently identified as safety concern.    Remove drugs/medications (over-the-counter, prescriptions, illicit drugs), all items previously/currently identified as a safety concern.  The family member/significant other verbalizes understanding of the suicide prevention education information provided.  The family member/significant other agrees to remove the items of safety concern listed above.  Carloyn Jaeger Grossman-Orr 12/21/2018, 4:55 PM

## 2018-12-21 NOTE — [facility] Group Notes (Signed)
  [facility] LCSW Group Therapy Note  Date/Time:  12/21/2018 11:15AM-12:00PM  Type of Therapy and Topic:  Group Therapy:  Feelings About Hospitalization  Participation Level:  Active   Description of Group This process group involved patients discussing their feelings related to being hospitalized, as well as the benefits they see to being in the hospital.  These feelings and benefits were itemized.  The group then brainstormed specific ways in which they could seek those same benefits when they discharge and return home.  Therapeutic Goals 1. Patient will identify and describe positive and negative feelings related to hospitalization 2. Patient will verbalize benefits of hospitalization to themselves personally 3. Patients will brainstorm together ways they can obtain similar benefits in the outpatient setting, identify barriers to wellness and possible solutions  Summary of Patient Progress:  The patient expressed his primary feelings about being hospitalized are that he does not mind, because there are "good people, food, a bed, and TV.  You will discharge me when you are ready."  He said he is happy to be having his medications stabilized.  He made considerable sense initially, then became perturbed by another patient's monopolizing and disorganization, went into a tangent that was not related at all to the topic and did not make sense to the group setting.  However, he quickly became annoyed, left the room, then returned calmer and in a more pleasant mood.  He gave somewhat of a "lecture" to the group about staying on medicines.  Therapeutic Modalities Cognitive Behavioral Therapy Motivational Interviewing    Ambrose Mantle, LCSW 12/21/2018, 5:35 PM

## 2018-12-22 MED ORDER — QUETIAPINE FUMARATE 50 MG PO TABS
150.0000 mg | ORAL_TABLET | Freq: Every day | ORAL | Status: DC
  Administered 2018-12-22: 150 mg via ORAL
  Filled 2018-12-22 (×2): qty 1

## 2018-12-22 MED ORDER — QUETIAPINE FUMARATE 25 MG PO TABS
75.0000 mg | ORAL_TABLET | Freq: Two times a day (BID) | ORAL | Status: DC
  Administered 2018-12-22 – 2018-12-23 (×2): 75 mg via ORAL
  Filled 2018-12-22 (×4): qty 3

## 2018-12-22 MED ORDER — LORAZEPAM 2 MG/ML IJ SOLN: 1.0000 mg | Freq: Four times a day (QID) | INTRAMUSCULAR | Status: DC | PRN

## 2018-12-22 MED ORDER — LORAZEPAM 1 MG PO TABS
2.0000 mg | ORAL_TABLET | Freq: Once | ORAL | Status: AC
  Administered 2018-12-22: 2 mg via ORAL

## 2018-12-22 MED ORDER — QUETIAPINE FUMARATE 50 MG PO TABS: 50.0000 mg | ORAL_TABLET | Freq: Two times a day (BID) | ORAL | Status: DC

## 2018-12-22 MED ORDER — LORAZEPAM 1 MG PO TABS
ORAL_TABLET | ORAL | Status: AC
  Filled 2018-12-22: qty 2

## 2018-12-22 MED ORDER — ZIPRASIDONE MESYLATE 20 MG IM SOLR: 10.0000 mg | Freq: Four times a day (QID) | INTRAMUSCULAR | Status: DC | PRN

## 2018-12-22 MED ORDER — LORAZEPAM 1 MG PO TABS: 1.0000 mg | ORAL_TABLET | ORAL | Status: DC | PRN

## 2018-12-22 MED ORDER — QUETIAPINE FUMARATE 25 MG PO TABS
75.0000 mg | ORAL_TABLET | Freq: Two times a day (BID) | ORAL | Status: DC
  Filled 2018-12-22 (×2): qty 3

## 2018-12-22 MED ORDER — ZIPRASIDONE MESYLATE 20 MG IM SOLR
10.0000 mg | Freq: Once | INTRAMUSCULAR | Status: DC
  Filled 2018-12-22: qty 20

## 2018-12-22 MED ORDER — LORAZEPAM 1 MG PO TABS
1.0000 mg | ORAL_TABLET | Freq: Four times a day (QID) | ORAL | Status: DC | PRN
  Administered 2018-12-23 – 2018-12-24 (×4): 1 mg via ORAL
  Filled 2018-12-22 (×4): qty 1

## 2018-12-22 MED ORDER — QUETIAPINE FUMARATE 200 MG PO TABS: 200.0000 mg | ORAL_TABLET | Freq: Every day | ORAL | Status: DC

## 2018-12-22 MED ORDER — QUETIAPINE FUMARATE 200 MG PO TABS
200.0000 mg | ORAL_TABLET | Freq: Every day | ORAL | Status: DC
  Filled 2018-12-22: qty 1

## 2018-12-22 NOTE — Plan of Care (Signed)
D: Patient presents elevated, labile, pacing. He is restless and feels he cannot sit still, however, he is yawning frequently and appears to be fighting getting some sleep. He slept well last night, and received medication that was helpful. His appetite is good, energy normal and concentration good. He rates his depression 5/10, sense of hopelessness 0/10 and wrote "weed" under anxiety scale. He reports chills and tremors, but these were not evident on assessment. Patient denies SI/HI/AVH.  A: Patient checked q15 min, and checks reviewed. Reviewed medication changes with patient and educated on side effects. Educated patient on importance of attending group therapy sessions and educated on several coping skills. Encouarged participation in milieu through recreation therapy and attending meals with peers. Support and encouragement provided. Fluids offered. R: Patient receptive to education on medications, and is medication compliant. Patient contracts for safety on the unit. Goal: "Stop arguing" and "pray".  Problem: Education: Goal: Emotional status will improve Outcome: Not Progressing Goal: Mental status will improve Outcome: Not Progressing   Problem: Activity: Goal: Interest or engagement in activities will improve Outcome: Not Progressing Goal: Sleeping patterns will improve Outcome: Not Progressing

## 2018-12-22 NOTE — Progress Notes (Addendum)
[facility] MD Progress Note  12/22/2018 10:48 AM Philip [name]  MRN:  [id number] Subjective:  Philip [name] continues to request to be dischared " Its boring here,  my medications is now stable and ready to leave."  Evaluation: Philip [name] seen sitting in day room interacting with peers continues to present labile, hyperverbal and can be aggressive with peers.  Patient continues to need constant redirection.  Was reported patient received 2 mg of Ativan this morning.  Patient reports he feels ready to discharge to get back to work.  Medication adjustments was made on 12/21/2018 we will continue to titrate Seroquel.  Reports taking and tolerating medications well.  Denies suicidal or homicidal ideations.  Denies auditory or visual hallucinations.  Reports a good appetite.  States he is resting well throughout the night.  Support encouragement reassurance was provided.  History: Per admission assessment:Mr. Philip [name] is 28 years of age and this is the latest of multiple admissions/encounters for him in mental health systems he presented seeking to be reinstituted on his psychotropic medications, states that help with anger and he also endorses paranoia, believes he is targeted by law enforcement for no specific reason however he was indeed recently incarcerated and states he has been off his meds since release.Patient actually presented yesterday in custody of police he was breaking out windows and threatening to kill himself and even was delusional stated he was seeing things, believed he was dating a nurse at the hospital which is not true made numerous other bizarre and delusional statements    Principal Problem: Schizoaffective by history Diagnosis: Active Problems:   Schizoaffective disorder (HCC)  Total Time spent with patient: 20 minutes  Past Medical History:  Past Medical History:  Diagnosis Date  . ADHD (attention deficit hyperactivity disorder)   . Asthma   . Bipolar affective disorder (HCC)   .  Schizophrenia [facility])     Past Surgical History:  Procedure Laterality Date  . NO PAST SURGERIES     Family History:  Family History  Problem Relation Age of Onset  . Hypertension Mother     Social History:  Social History   Substance and Sexual Activity  Alcohol Use Yes  . Alcohol/week: 1.0 standard drinks  . Types: 1 Cans of beer per week     Social History   Substance and Sexual Activity  Drug Use Yes  . Types: Marijuana    Social History   Socioeconomic History  . Marital status: Married    Spouse name: Not on file  . Number of children: Not on file  . Years of education: Not on file  . Highest education level: Not on file  Occupational History  . Not on file  Social Needs  . Financial resource strain: Not on file  . Food insecurity:    Worry: Not on file    Inability: Not on file  . Transportation needs:    Medical: Not on file    Non-medical: Not on file  Tobacco Use  . Smoking status: Current Every Day Smoker    Packs/day: 1.00    Years: 6.00    Pack years: 6.00    Types: Cigarettes  . Smokeless tobacco: Never Used  . Tobacco comment: unable to assess  Substance and Sexual Activity  . Alcohol use: Yes    Alcohol/week: 1.0 standard drinks    Types: 1 Cans of beer per week  . Drug use: Yes    Types: Marijuana  . Sexual activity: Yes  Comment: unable to access  Lifestyle  . Physical activity:    Days per week: Not on file    Minutes per session: Not on file  . Stress: Not on file  Relationships  . Social connections:    Talks on phone: Not on file    Gets together: Not on file    Attends religious service: Not on file    Active member of club or organization: Not on file    Attends meetings of clubs or organizations: Not on file    Relationship status: Not on file  Other Topics Concern  . Not on file  Social History Narrative  . Not on file   Additional Social History:                         Sleep: Fair  Appetite:   Fair  Current Medications: Current Facility-Administered Medications  Medication Dose Route Frequency Provider Last Rate Last Dose  . acetaminophen (TYLENOL) tablet 650 mg  650 mg Oral Q6H PRN Malvin Johns, MD   650 mg at 12/20/18 1653  . alum & mag hydroxide-simeth (MAALOX/MYLANTA) 200-200-20 MG/5ML suspension 30 mL  30 mL Oral Q4H PRN Malvin Johns, MD      . atomoxetine (STRATTERA) capsule 10 mg  10 mg Oral Daily Malvin Johns, MD   10 mg at 12/22/18 0558  . carbamazepine (TEGRETOL XR) 12 hr tablet 200 mg  200 mg Oral BID Malvin Johns, MD   200 mg at 12/22/18 0558  . hydrOXYzine (ATARAX/VISTARIL) tablet 50 mg  50 mg Oral TID PRN Malvin Johns, MD   50 mg at 12/21/18 1939  . LORazepam (ATIVAN) tablet 1 mg  1 mg Oral Q6H PRN Oneta Rack, NP       Or  . LORazepam (ATIVAN) injection 1 mg  1 mg Intramuscular Q6H PRN Oneta Rack, NP      . ziprasidone (GEODON) injection 10 mg  10 mg Intramuscular Once Oneta Rack, NP       And  . LORazepam (ATIVAN) tablet 1 mg  1 mg Oral PRN Oneta Rack, NP      . magnesium hydroxide (MILK OF MAGNESIA) suspension 30 mL  30 mL Oral Daily PRN Malvin Johns, MD      . mirtazapine (REMERON SOL-TAB) disintegrating tablet 15 mg  15 mg Oral QHS Malvin Johns, MD   15 mg at 12/21/18 1939  . QUEtiapine (SEROQUEL) tablet 150 mg  150 mg Oral QHS Oneta Rack, NP       And  . QUEtiapine (SEROQUEL) tablet 75 mg  75 mg Oral BID Oneta Rack, NP        Lab Results:  No results found for this or any previous visit (from the past 48 hour(s)).  Blood Alcohol level:  Lab Results  Component Value Date   ETH <10 12/18/2018   ETH <10 12/04/2018    Metabolic Disorder Labs: Lab Results  Component Value Date   HGBA1C 5.5 09/03/2014   MPG 111 09/03/2014   No results found for: PROLACTIN Lab Results  Component Value Date   CHOL 112 09/03/2014   TRIG 65 09/03/2014   HDL 32 (L) 09/03/2014   CHOLHDL 3.5 09/03/2014   VLDL 13 09/03/2014   LDLCALC 67  09/03/2014    Physical Findings: AIMS: Facial and Oral Movements Muscles of Facial Expression: None, normal Lips and Perioral Area: None, normal Jaw: None, normal Tongue: None, normal,Extremity  Movements Upper (arms, wrists, hands, fingers): None, normal Lower (legs, knees, ankles, toes): None, normal, Trunk Movements Neck, shoulders, hips: None, normal, Overall Severity Severity of abnormal movements (highest score from questions above): None, normal Incapacitation due to abnormal movements: None, normal Patient's awareness of abnormal movements (rate only patient's report): No Awareness, Dental Status Current problems with teeth and/or dentures?: No Does patient usually wear dentures?: No  CIWA:  CIWA-Ar Total: 0 COWS:  COWS Total Score: 0  Musculoskeletal: Strength & Muscle Tone: within normal limits Gait & Station: normal Patient leans: N/A  Psychiatric Specialty Exam: Physical Exam  Vitals reviewed. Constitutional: He appears well-developed.  Psychiatric: He has a normal mood and affect. His behavior is normal.    Review of Systems  Psychiatric/Behavioral: The patient is nervous/anxious.   All other systems reviewed and are negative.   Blood pressure 138/84, pulse 89, temperature 97.6 F (36.4 C), temperature source Oral, resp. rate 18, height  (1.778 m), weight 83.9 kg, SpO2 100 %.Body mass index is 26.54 kg/m.  General Appearance: Casual  Eye Contact:  Good  Speech:  Clear and Coherent  Volume:  Increased pressured   Mood:  Anxious  Affect:  Appropriate  Thought Process:  Coherent  Orientation:  Full (Time, Place, and Person)  Thought Content:  Paranoid Ideation and Tangential  Suicidal Thoughts:  No  Homicidal Thoughts:  No  Memory:  Immediate;   Good  Judgement:  Good  Insight:  Good  Psychomotor Activity:  Normal  Concentration:  Concentration: Good  Recall:  Good  Fund of Knowledge:  Good  Language:  Good  Akathisia:  Negative  Handed:  Right   AIMS (if indicated):     Assets:  Resilience Social Support  ADL's:  Intact  Cognition:  WNL  Sleep:  Number of Hours: 4.25     Treatment Plan Summary: Daily contact with patient to assess and evaluate symptoms and progress in treatment and Medication management   Continue with current treatment plan on 12/21/2018 as listed below except were noted  Mood stabilization:   Continue Tegretol 200 mg p.o. twice daily  Discontinued Zyprexa 10 mg p.o. twice daily  Increased  Seroquel 50 mg p.o. twice daily  To 75 mg BID and Seroquel  100 mg p.o. nightly to 150 mg QHS   Continue Strattera 10 mg p.o. daily  Insomnia:  Continue Remeron 15 mg p.o. nightly  Orders placed for EKG 386 QT  CSW to continue working on discharge disposition. Patient encouraged to participate within the milieu   Oneta Rack, NP 12/22/2018, 10:48 AM ..Agree with NP Progress Note

## 2018-12-22 NOTE — Progress Notes (Signed)
D: Pt denies SI/HI/AVH. Pt is pleasant and cooperative. Pt has issue with impulse control, but pt has been appropriate this evening . Pt knows Clinical research associate from outside and has been very respectful on the unit. Pt respects writer due to outside relationship since pt was younger.   A: Pt was offered support and encouragement. Pt was given scheduled medications. Pt was encourage to attend groups. Q 15 minute checks were done for safety.   R:Pt interacts well with peers and staff. Pt is taking medication. Pt has no complaints.Pt receptive to treatment and safety maintained on unit.   Problem: Education: Goal: Mental status will improve Outcome: Progressing   Problem: Activity: Goal: Interest or engagement in activities will improve Outcome: Progressing   Problem: Activity: Goal: Sleeping patterns will improve Outcome: Progressing   Problem: Coping: Goal: Ability to verbalize frustrations and anger appropriately will improve Outcome: Progressing

## 2018-12-22 NOTE — [facility] Group Notes (Signed)
[facility] Group Notes:  (Nursing/MHT/Case Management/Adjunct)  Date:  12/22/2018  Time:  6:11 PM  Type of Therapy:  Nurse Education  Participation Level:  Active  Participation Quality:  Appropriate and Attentive  Affect:  Appropriate  Cognitive:  Alert and Appropriate  Insight:  Appropriate and Good  Engagement in Group:  Engaged  Modes of Intervention:  Activity and Discussion  Summary of Progress/Problems: In this group, we discussed healthy communication and how to express our needs to others. We covered how to make appropriate "I feel" statements. We also discussed the 5 love languages and took the love language quiz in the program handbook.   Kirstie Mirza 12/22/2018, 6:11 PM

## 2018-12-22 NOTE — Progress Notes (Signed)
Pt pacing hallway this morning.  He began raising his voice to the point of yelling.  He was talking about how he is going to "pop" a peer on the unit when peer gets up.  Attempted to verbally de-escalate pt.  Pt yelled "fuck you" and walked away.  On-site provider contacted for additional orders and Ativan 2 mg POX1 was ordered and administered.  Writer was instructed to administer AM medications early as well.  Pt was compliant with medications.  Pt discussed how he has a chemical imbalance and poor impulse control after taking his medication.  He states "I'll be good.  This should kick in in about 5 or 10 minutes."  Pt is safe on the unit.  Will continue to monitor and assess.

## 2018-12-22 NOTE — [facility] Group Notes (Signed)
[facility] LCSW Group Therapy Note  Date/Time:  12/22/2018  11:00AM-12:00PM  Type of Therapy and Topic:  Group Therapy:  Music and Mood  Participation Level:  Minimal   Description of Group: In this process group, members listened to a variety of genres of music and identified that different types of music evoke different responses.  Patients were encouraged to identify music that was soothing for them and music that was energizing for them.  Patients discussed how this knowledge can help with wellness and recovery in various ways including managing depression and anxiety as well as encouraging healthy sleep habits.    Therapeutic Goals: 1. Patients will explore the impact of different varieties of music on mood 2. Patients will verbalize the thoughts they have when listening to different types of music 3. Patients will identify music that is soothing to them as well as music that is energizing to them 4. Patients will discuss how to use this knowledge to assist in maintaining wellness and recovery 5. Patients will explore the use of music as a coping skill  Summary of Patient Progress:  At the beginning of group, patient was not present.  He arrived later then came and went depending on whether he liked a song.  Therapeutic Modalities: Solution Focused Brief Therapy Activity   Ambrose Mantle, LCSW

## 2018-12-22 NOTE — Progress Notes (Signed)
D: Patient pacing down the hall near another patient's room whom he had a conflict with overnight. We have educated patient not to jeopardize his discharge plan and to avoid conflict with his peers. We are more closely monitoring him. He does not appear agitated and keeps saying "I feel calm." He is talking about discharge frequently, and stating he would like to leave today. A: Provided education on the discharge process. Set boundaries with peers. R: Will monitor closely. Patient in dayroom talking with staff at this time.

## 2018-12-23 MED ORDER — HALOPERIDOL 5 MG PO TABS
5.0000 mg | ORAL_TABLET | Freq: Two times a day (BID) | ORAL | Status: DC
  Administered 2018-12-23 – 2018-12-24 (×3): 5 mg via ORAL
  Filled 2018-12-23 (×5): qty 1

## 2018-12-23 MED ORDER — LORAZEPAM 2 MG/ML IJ SOLN
INTRAMUSCULAR | Status: AC
  Filled 2018-12-23: qty 2

## 2018-12-23 MED ORDER — LORAZEPAM 2 MG/ML IJ SOLN
4.0000 mg | Freq: Once | INTRAMUSCULAR | Status: AC
  Administered 2018-12-23: 4 mg via INTRAMUSCULAR

## 2018-12-23 MED ORDER — CARBAMAZEPINE ER 200 MG PO TB12
200.0000 mg | ORAL_TABLET | Freq: Three times a day (TID) | ORAL | Status: DC
  Administered 2018-12-23 – 2018-12-24 (×3): 200 mg via ORAL
  Filled 2018-12-23 (×6): qty 1

## 2018-12-23 MED ORDER — QUETIAPINE FUMARATE 200 MG PO TABS
200.0000 mg | ORAL_TABLET | Freq: Three times a day (TID) | ORAL | Status: DC
  Administered 2018-12-23 – 2018-12-24 (×3): 200 mg via ORAL
  Filled 2018-12-23 (×6): qty 1

## 2018-12-23 NOTE — Progress Notes (Signed)
Recreation Therapy Notes  Date: 2.24.20 Time: 1000 Location: 500 Hall Dayroom  Group Topic: Coping Skills  Goal Area(s) Addresses:  Patient will identify positive coping skills. Patient will identify benefit of using coping strategies post d/c.  Intervention: Worksheet  Activity: Healthy vs. Unhealthy Coping Strategies.  Patients were to describe a problem they are currently dealing with.  Patients then identified the unhealthy coping strategies they currently use and the consequences of those unhealthy coping strategies.  Patients would then identify healthy coping strategies they use or could use.  Patients would identify the expected outcomes of the healthy coping strategies and barriers that prevent them from using their positive coping strategies.  Education: Coping Skills, Discharge Planning.   Education Outcome: Acknowledges understanding/In group clarification offered/Needs additional education.   Clinical Observations/Feedback: Pt did not attend group.     Giani Betzold, LRT/CTRS         Dyshon Philbin A 12/23/2018 11:56 AM 

## 2018-12-23 NOTE — Progress Notes (Signed)
D: Patient is noncompliant with safety precautions. He continues to pace the halls, talking nonsensically about discharge. He is unstable and unsteady with the medications he has received. He is uncooperative with taking a nap or getting rest. Nonskid footwear in place, patient refusing armband (fall risk).  A: Patient continually reinforced on safety precautions. R: Continues to pace the hall.

## 2018-12-23 NOTE — Progress Notes (Signed)
Pt up agitated , pt needed to be de-escalated. Pt appears to be grandiose.

## 2018-12-23 NOTE — Progress Notes (Signed)
D: Patient was throwing food on the floor from his dinner tray, both in his room and in the hall around the nurse's station. He was not redirectable. His gait was staggering, and unsteady. He is medicated, but refusing to go to sleep. He appears to be fighting the medication. He is still agitated and argumentative with the staff. He induced vomiting by coughing until he had emesis. Emesis was contents of his previous meal, mucous.  A: Patient continually redirected. 1:1 initiated for patient safety.  R: Patient not redirectable.

## 2018-12-23 NOTE — Plan of Care (Signed)
D: Patient presents agitated, yelling, demanding to be discharged. He has poor impulse control and his behavior is demanding/childlike. He is having difficulty with finding a place to stay. He required IM Ativan to help regain control of his emotions. Patient denies SI/HI/AVH. However, he threatened that "it will be worse for me to stay here than to go home. It will be worse for everyone." He slept well last night, and felt medication was helpful. His appetite is good, energy normal and concentration good. He rates his depression, hopelessness and anxiety 0/10. He denies withdrawal symptoms or physical complaints.  A: Patient checked q15 min, and checks reviewed. Reviewed medication changes with patient and educated on side effects. Educated patient on importance of attending group therapy sessions and educated on several coping skills. Encouarged participation in milieu through recreation therapy and attending meals with peers. Support and encouragement provided. Fluids offered. R: Patient receptive to education on medications, and is medication compliant. Patient contracts for safety on the unit. Goal: "Controlling my temper."   Problem: Education: Goal: Emotional status will improve Outcome: Not Progressing Goal: Mental status will improve Outcome: Not Progressing   Problem: Activity: Goal: Interest or engagement in activities will improve Outcome: Not Progressing Goal: Sleeping patterns will improve Outcome: Not Progressing

## 2018-12-23 NOTE — Progress Notes (Signed)
Pt up due to roommate snoring, pt having a hard time going back to sleep even after getting Ativan Per [facility]

## 2018-12-23 NOTE — Progress Notes (Signed)
[facility] MD Progress Note  12/23/2018 10:44 AM Philip [name]  MRN:  [id number] Subjective:    Patient was insistent upon discharge, however has really nowhere to go he claims he has 2 jobs and places to live but his family insist that he is not welcome back at the family home due to destruction of property which will probably cost him their lease. We saw his agitation and escalation he was yelling pretty much nonstop demanding to go for period of time and received IM Ativan at 4 mg which did not put him to sleep but slowed him down and stop the yelling behaviors.  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 ADHD (attention deficit hyperactivity disorder)   . Asthma   . Bipolar affective disorder (HCC)   . Schizophrenia [facility])     Past Surgical History:  Procedure Laterality Date  . NO PAST SURGERIES     Family History:  Family History  Problem Relation Age of Onset  . Hypertension Mother     Social History:  Social History   Substance and Sexual Activity  Alcohol Use Yes  . Alcohol/week: 1.0 standard drinks  . Types: 1 Cans of beer per week     Social History   Substance and Sexual Activity  Drug Use Yes  . Types: Marijuana    Social History   Socioeconomic History  . Marital status: Married    Spouse name: Not on file  . Number of children: Not on file  . Years of education: Not on file  . Highest  education level: Not on file  Occupational History  . Not on file  Social Needs  . Financial resource strain: Not on file  . Food insecurity:    Worry: Not on file    Inability: Not on file  . Transportation needs:    Medical: Not on file    Non-medical: Not on file  Tobacco Use  . Smoking status: Current Every Day Smoker    Packs/day: 1.00    Years: 6.00    Pack years: 6.00    Types: Cigarettes  . Smokeless tobacco: Never Used  . Tobacco comment: unable to assess  Substance and Sexual Activity  . Alcohol use: Yes    Alcohol/week: 1.0 standard drinks    Types: 1 Cans of beer per week  . Drug use: Yes    Types: Marijuana  . Sexual activity: Yes    Comment: unable to access  Lifestyle  . Physical activity:    Days per week: Not on file    Minutes per session: Not on file  . Stress: Not on file  Relationships  . Social connections:    Talks on phone: Not on file    Gets together: Not on file    Attends religious service: Not on file    Active member of club or organization: Not on file    Attends meetings of clubs or organizations: Not on file  Relationship status: Not on file  Other Topics Concern  . Not on file  Social History Narrative  . Not on file   Additional Social History:                         Sleep: Fair  Appetite:  Fair  Current Medications: Current Facility-Administered Medications  Medication Dose Route Frequency Provider Last Rate Last Dose  . acetaminophen (TYLENOL) tablet 650 mg  650 mg Oral Q6H PRN Malvin Johns, MD   650 mg at 12/20/18 1653  . alum & mag hydroxide-simeth (MAALOX/MYLANTA) 200-200-20 MG/5ML suspension 30 mL  30 mL Oral Q4H PRN Malvin Johns, MD      . atomoxetine (STRATTERA) capsule 10 mg  10 mg Oral Daily Malvin Johns, MD   10 mg at 12/23/18 0604  . carbamazepine (TEGRETOL XR) 12 hr tablet 200 mg  200 mg Oral TID Malvin Johns, MD      . haloperidol (HALDOL) tablet 5 mg  5 mg Oral BID Malvin Johns, MD      .  hydrOXYzine (ATARAX/VISTARIL) tablet 50 mg  50 mg Oral TID PRN Malvin Johns, MD   50 mg at 12/22/18 2054  . LORazepam (ATIVAN) tablet 1 mg  1 mg Oral Q6H PRN Oneta Rack, NP   1 mg at 12/23/18 1740   Or  . LORazepam (ATIVAN) injection 1 mg  1 mg Intramuscular Q6H PRN Oneta Rack, NP      . ziprasidone (GEODON) injection 10 mg  10 mg Intramuscular Q6H PRN Oneta Rack, NP       And  . LORazepam (ATIVAN) injection 1 mg  1 mg Intramuscular Q6H PRN Oneta Rack, NP      . magnesium hydroxide (MILK OF MAGNESIA) suspension 30 mL  30 mL Oral Daily PRN Malvin Johns, MD      . mirtazapine (REMERON SOL-TAB) disintegrating tablet 15 mg  15 mg Oral QHS Malvin Johns, MD   15 mg at 12/22/18 2055  . QUEtiapine (SEROQUEL) tablet 200 mg  200 mg Oral TID Malvin Johns, MD        Lab Results: No results found for this or any previous visit (from the past 48 hour(s)).  Blood Alcohol level:  Lab Results  Component Value Date   ETH <10 12/18/2018   ETH <10 12/04/2018    Metabolic Disorder Labs: Lab Results  Component Value Date   HGBA1C 5.5 09/03/2014   MPG 111 09/03/2014   No results found for: PROLACTIN Lab Results  Component Value Date   CHOL 112 09/03/2014   TRIG 65 09/03/2014   HDL 32 (L) 09/03/2014   CHOLHDL 3.5 09/03/2014   VLDL 13 09/03/2014   LDLCALC 67 09/03/2014    Physical Findings: AIMS: Facial and Oral Movements Muscles of Facial Expression: None, normal Lips and Perioral Area: None, normal Jaw: None, normal Tongue: None, normal,Extremity Movements Upper (arms, wrists, hands, fingers): None, normal Lower (legs, knees, ankles, toes): None, normal, Trunk Movements Neck, shoulders, hips: None, normal, Overall Severity Severity of abnormal movements (highest score from questions above): None, normal Incapacitation due to abnormal movements: None, normal Patient's awareness of abnormal movements (rate only patient's report): No Awareness, Dental Status Current  problems with teeth and/or dentures?: No Does patient usually wear dentures?: No  CIWA:  CIWA-Ar Total: 0 COWS:  COWS Total Score: 1  Musculoskeletal: Strength & Muscle Tone: increased Gait & Station: normal Patient leans: N/A  Psychiatric Specialty  Exam: Physical Exam  ROS  Blood pressure (!) 145/97, pulse 86, temperature (!) 97.5 F (36.4 C), temperature source Oral, resp. rate 18, height  (1.778 m), weight 83.9 kg, SpO2 100 %.Body mass index is 26.54 kg/m.  General Appearance: Disheveled  Eye Contact:  Minimal  Speech:  Pressured  Volume:  Increased  Mood:  Angry and Irritable  Affect:  Labile  Thought Process:  Goal Directed  Orientation:  Full (Time, Place, and Person)  Thought Content:  Illogical  Suicidal Thoughts:  No  Homicidal Thoughts:  Yes.  without intent/plan reguarding sister  Memory:  Immediate;   Fair  Judgement:  Impaired  Insight:  Lacking  Psychomotor Activity:  Increased  Concentration:  Concentration: Poor  Recall:  Poor  Fund of Knowledge:  Poor  Language:  Poor  Akathisia:  Negative  Handed:  Right  AIMS (if indicated):     Assets:  Resilience  ADL's:  Intact  Cognition:  WNL  Sleep:  Number of Hours: 2.75     Treatment Plan Summary: Daily contact with patient to assess and evaluate symptoms and progress in treatment, Medication management and Plan Continue but escalate Seroquel and Tegretol, add haloperidol, monitor for safety continue to seek discharge options  Taeshaun Rames, MD 12/23/2018, 10:44 AM

## 2018-12-23 NOTE — Progress Notes (Addendum)
Pt waiting for CSW when I arrived on [address] hall, very upset when informed he is not on the list for discharge today.  Finally calmed down enough for CSW and Olivette RN to speak with him in his room about plan for living situation at discharge. Pt got his uncle, Reynold Schlag, on the phone, gave CSW permission to add him to the ROI, and said he will "go to his barbershop" upon discharge.  Pt somewhat all over the place: said he will stay with his girlfriend at [employer], will go to [place], will go to the barbershop.  CSW spoke to uncle briefly on the phone and uncle is going to call pt sister and see what is going on. [name], MSW, LCSW Clinical Social Worker 12/23/2018 10:01 AM   CSW received call form Olalekan Lehtinen, uncle, who said that he is in contact with pt sister and they will start working on a plan for pt for tomorrow.  They do not have anything yet.  Will speak again later this afternoon to see where things are at. [name], MSW, LCSW Clinical Social Worker 12/23/2018 10:48 AM   CSW spoke with pt sister, Alden Benjamin.  Pt will have to go back to the same home he came from.  We discussed referral to TCT, which she was glad to hear about.  Charisse March will try to contact attorneys about progress on disability benefits.  She is able to get pt at 930 tomorrow morning before work. [name], MSW, LCSW Clinical Social Worker 12/23/2018 3:20 PM

## 2018-12-23 NOTE — Progress Notes (Signed)
Nursing 1:1 note D:Pt observed sleeping in bed with eyes closed. RR even and unlabored. No distress noted. A: 1:1 observation continues for safety  R: pt remains safe  

## 2018-12-23 NOTE — Progress Notes (Signed)
D: Patient was heard coughing heavily in room. When I checked on him, he had caused himself to vomit on the floor. He stated "I threw up because I was angry about being here." It appears to be behavioral, as he denies any symptoms of nausea.  A: Instructed patient to lay down and allow the medication to help him get some rest. R: Patient refusing to take a nap.

## 2018-12-23 NOTE — Progress Notes (Signed)
D: Pt denies SI/HI/AVH. Pt is pleasant and cooperative. Pt continues to fight medication, pt on 1:1 due to pt unsteadiness , pt stated he was ready to D/C tomorrow.  A: Pt was offered support and encouragement. Pt was given scheduled medications. Pt was encourage to attend groups. Q 15 minute checks were done for safety.  R: safety maintained on unit.

## 2018-12-23 NOTE — [facility] Group Notes (Signed)
[facility] LCSW Group Therapy Note  Date/Time: 12/23/18, 1315  Type of Therapy and Topic:  Group Therapy:  Overcoming Obstacles  Participation Level:  minimal  Description of Group:    In this group patients will be encouraged to explore what they see as obstacles to their own wellness and recovery. They will be guided to discuss their thoughts, feelings, and behaviors related to these obstacles. The group will process together ways to cope with barriers, with attention given to specific choices patients can make. Each patient will be challenged to identify changes they are motivated to make in order to overcome their obstacles. This group will be process-oriented, with patients participating in exploration of their own experiences as well as giving and receiving support and challenge from other group members.  Therapeutic Goals: 1. Patient will identify personal and current obstacles as they relate to admission. 2. Patient will identify barriers that currently interfere with their wellness or overcoming obstacles.  3. Patient will identify feelings, thought process and behaviors related to these barriers. 4. Patient will identify two changes they are willing to make to overcome these obstacles:    Summary of Patient Progress: Pt came to group but was drowsy and CSW finally redirected him back to his room as he was struggling to stay awake and unable to participate coherently.       Therapeutic Modalities:   Cognitive Behavioral Therapy Solution Focused Therapy Motivational Interviewing Relapse Prevention Therapy  Daleen Squibb, LCSW

## 2018-12-24 MED ORDER — ATOMOXETINE HCL 10 MG PO CAPS: 10.0000 mg | ORAL_CAPSULE | Freq: Every day | ORAL | 1 refills | Status: DC

## 2018-12-24 MED ORDER — HYDROXYZINE HCL 50 MG PO TABS: 50.0000 mg | ORAL_TABLET | Freq: Three times a day (TID) | ORAL | 0 refills | Status: DC | PRN

## 2018-12-24 MED ORDER — CARBAMAZEPINE ER 200 MG PO TB12: ORAL_TABLET | ORAL | 2 refills | Status: DC

## 2018-12-24 MED ORDER — MIRTAZAPINE 15 MG PO TBDP: 15.0000 mg | ORAL_TABLET | Freq: Every day | ORAL | 1 refills | Status: DC

## 2018-12-24 MED ORDER — CARBAMAZEPINE ER 200 MG PO TB12
200.0000 mg | ORAL_TABLET | Freq: Every day | ORAL | Status: DC
  Filled 2018-12-24 (×2): qty 21

## 2018-12-24 MED ORDER — QUETIAPINE FUMARATE 200 MG PO TABS: 200.0000 mg | ORAL_TABLET | Freq: Three times a day (TID) | ORAL | 2 refills | Status: DC

## 2018-12-24 NOTE — Plan of Care (Signed)
Discharge note  Patient verbalizes readiness for discharge. Follow up plan explained, AVS, Transition record and SRA given. Prescriptions and teaching provided. Belongings returned and signed for. Suicide safety plan completed and signed. Patient verbalizes understanding. Patient denies SI/HI and assures this Clinical research associate he will seek assistance should that change. Patient discharged to lobby where sister was waiting.  Problem: Education: Goal: Knowledge of [facility] Education information/materials will improve Outcome: Adequate for Discharge Goal: Emotional status will improve Outcome: Adequate for Discharge Goal: Mental status will improve Outcome: Adequate for Discharge Goal: Verbalization of understanding the information provided will improve Outcome: Adequate for Discharge   Problem: Activity: Goal: Interest or engagement in activities will improve Outcome: Adequate for Discharge Goal: Sleeping patterns will improve Outcome: Adequate for Discharge   Problem: Coping: Goal: Ability to verbalize frustrations and anger appropriately will improve Outcome: Adequate for Discharge Goal: Ability to demonstrate self-control will improve Outcome: Adequate for Discharge   Problem: Health Behavior/Discharge Planning: Goal: Identification of resources available to assist in meeting health care needs will improve Outcome: Adequate for Discharge Goal: Compliance with treatment plan for underlying cause of condition will improve Outcome: Adequate for Discharge   Problem: Physical Regulation: Goal: Ability to maintain clinical measurements within normal limits will improve Outcome: Adequate for Discharge   Problem: Safety: Goal: Periods of time without injury will increase Outcome: Adequate for Discharge   Problem: Education: Goal: Ability to verbalize precipitating factors for violent behavior will improve Outcome: Adequate for Discharge   Problem: Coping: Goal: Ability to  verbalize frustrations and anger appropriately will improve Outcome: Adequate for Discharge   Problem: Health Behavior/Discharge Planning: Goal: Ability to implement measures to prevent violent behavior in the future will improve Outcome: Adequate for Discharge   Problem: Safety: Goal: Ability to demonstrate self-control will improve Outcome: Adequate for Discharge Goal: Ability to redirect hostility and anger into socially appropriate behaviors will improve Outcome: Adequate for Discharge   Problem: Spiritual Needs Goal: Ability to function at adequate level Outcome: Adequate for Discharge

## 2018-12-24 NOTE — Progress Notes (Signed)
Nursing 1:1 note D:Pt in room eating. RR even and unlabored. No distress noted.Pt used the bathroom at 0203  A: 1:1 observation continues for safety  R: pt remains safe

## 2018-12-24 NOTE — Discharge Summary (Signed)
Physician Discharge Summary Note  Patient:  Philip [name] is an 28 y.o., male  MRN:  [id number]  DOB:  January 12, 1991  Patient phone:  [phone] (home)   Patient address:   [address] [place] [address],   Total Time spent with patient: Greater than 30 minutes  Date of Admission:  12/19/2018  Date of Discharge: 12-24-18  Reason for Admission: Worsening symptoms of Schizophrenia.  Principal Problem: Schizoaffective disorder [facility])  Discharge Diagnoses: Principal Problem:   Schizoaffective disorder [facility])  Past Psychiatric History: Schizoaffective disorder.  Past Medical History:  Past Medical History:  Diagnosis Date  . ADHD (attention deficit hyperactivity disorder)   . Asthma   . Bipolar affective disorder (HCC)   . Schizophrenia [facility])     Past Surgical History:  Procedure Laterality Date  . NO PAST SURGERIES     Family History:  Family History  Problem Relation Age of Onset  . Hypertension Mother    Family Psychiatric  History: See H&P  Social History:  Social History   Substance and Sexual Activity  Alcohol Use Yes  . Alcohol/week: 1.0 standard drinks  . Types: 1 Cans of beer per week     Social History   Substance and Sexual Activity  Drug Use Yes  . Types: Marijuana    Social History   Socioeconomic History  . Marital status: Married    Spouse name: Not on file  . Number of children: Not on file  . Years of education: Not on file  . Highest education level: Not on file  Occupational History  . Not on file  Social Needs  . Financial resource strain: Not on file  . Food insecurity:    Worry: Not on file    Inability: Not on file  . Transportation needs:    Medical: Not on file    Non-medical: Not on file  Tobacco Use  . Smoking status: Current Every Day Smoker    Packs/day: 1.00    Years: 6.00    Pack years: 6.00    Types: Cigarettes  . Smokeless tobacco: Never Used  . Tobacco comment: unable to assess  Substance and Sexual  Activity  . Alcohol use: Yes    Alcohol/week: 1.0 standard drinks    Types: 1 Cans of beer per week  . Drug use: Yes    Types: Marijuana  . Sexual activity: Yes    Comment: unable to access  Lifestyle  . Physical activity:    Days per week: Not on file    Minutes per session: Not on file  . Stress: Not on file  Relationships  . Social connections:    Talks on phone: Not on file    Gets together: Not on file    Attends religious service: Not on file    Active member of club or organization: Not on file    Attends meetings of clubs or organizations: Not on file    Relationship status: Not on file  Other Topics Concern  . Not on file  Social History Narrative  . Not on file   Hospital Course: (Per Md's admission evaluation):Philip [name] is 28 years of age and this is the latest of multiple admissions/encounters for him in mental health systems he presented seeking to be reinstituted on his psychotropic medications, states that help with anger and he also endorses paranoia, believes he is targeted by law enforcement for no specific reason however he was indeed recently incarcerated and states he has  been off his meds since release. Patient actually presented yesterday in custody of police he was breaking out windows and threatening to kill himself and even was delusional stated he was seeing things, believed he was dating a nurse at the hospital which is not true made numerous other bizarre and delusional statements. 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 States he is considering giving it up and this may be fueling his pathology he denies other drugs of abuse. Required Haldol and other medications emergency department and this is helped to reorient him despite the trauma of his recent destruction of property and psychosis. Last inpatient and November 2015 at this institution, patient lives alone and apparently his mother has suffered from  vascular event and is in hospital care. Previous court and legal issues include possession of marijuana and carrying a concealed weapon without a permit he has a court date in March regarding that. Despite again the drama and issues listed he is actually very cooperative and pleasant on exam stating he knows he needs to be on medications and knows he needs to be compliant with them.  Philip [name] was admitted to [facility] with worsening symptoms of Schizoaffective disorder. Apparently has been off of his medicines for an unknown amount of time after being incarcerated. He has been behaving recklessly lately breaking windows, presenting with delusional & disorganized thinking. He presented to  the hospital highly psychotic requiring mood stabilization treatments.   After evaluation of his symptoms, the medication regimen targeting those symptoms were initiated. Philip [name] was medicated, stabilized & discharged on the medications as listed below on the discharge medication lists. And because he was highly manic & with erratic behaviors in the early part of his hospitalization, a brief treatment with haldol was added to his treatment regimen to aid the Seroquel in stabilizing his symptoms. The use of this second antipsychotic medication (haldol) was brief. It was discontinued prior to discharge. After initiation of his mental health treatment, Philip [name] was oriented to the unit and encouraged to attend/participate in the unit programming. He presented no other significant pre-existing medical problems that required treatment. He tolerated his treatment regimen without any adverse effects or reactions reported.  During his hospital stay, Philip [name] was evaluated daily by a clinical provider to ascertain his response to his treatment regimen. As the days go by, improvement was noted as evidenced by his report of decreasing symptoms, improved sleep, mood, affect & participation in the unit programming.  He was required on daily basis to  complete a self-inventory asssessment noting mood, mental status, any new symptoms, anxiety or concerns. His symptoms responded well to his treatment regimen, being in a therapeutic and supportive environment also assisted in his mood stability.   On this day of his discharge, Jacarri was in much improved condition than upon admission. His symptoms were reported as significantly decreased or resolved completely. Upon discharge, he denies SI/HI and voiced no AVH. He was motivated to continue taking medication with a goal of continued improvement in mental health. He is discharged to follow-up care for routine psychiatric care & medication management on an outpatient basis as noted below. He is provided with all the necessary information needed to make this appointment without problems. He was able to engage in safety planning including plan to return to [facility] or contact emergency services if he feels unable to maintain hisown safety or the safety of others. Pt had no further questions, comments or concerns. He left [facility]  with all personal belongings in no apparent distress.Transportation per sister.   Physical Findings: AIMS: Facial and Oral Movements Muscles of Facial Expression: None, normal Lips and Perioral Area: None, normal Jaw: None, normal Tongue: None, normal,Extremity Movements Upper (arms, wrists, hands, fingers): None, normal Lower (legs, knees, ankles, toes): None, normal, Trunk Movements Neck, shoulders, hips: None, normal, Overall Severity Severity of abnormal movements (highest score from questions above): None, normal Incapacitation due to abnormal movements: None, normal Patient's awareness of abnormal movements (rate only patient's report): No Awareness, Dental Status Current problems with teeth and/or dentures?: No Does patient usually wear dentures?: No  CIWA:  CIWA-Ar Total: 0 COWS:  COWS Total Score: 1  Musculoskeletal: Strength & Muscle Tone: within normal limits Gait &  Station: normal Patient leans: N/A  Psychiatric Specialty Exam: Physical Exam  Nursing note reviewed. Constitutional: He appears well-developed.  HENT:  Head: Normocephalic.  Eyes: Pupils are equal, round, and reactive to light.  Neck: Normal range of motion.  Cardiovascular: Normal rate.  Respiratory: Effort normal.  GI: Soft.  Genitourinary:    Genitourinary Comments: Deferred   Musculoskeletal: Normal range of motion.  Neurological: He is alert.  Skin: Skin is warm.    Review of Systems  Constitutional: Negative.   HENT: Negative.   Eyes: Negative.   Respiratory: Negative.  Negative for cough and shortness of breath.   Cardiovascular: Negative.  Negative for chest pain and palpitations.  Gastrointestinal: Negative.  Negative for abdominal pain, heartburn, nausea and vomiting.  Genitourinary: Negative.   Musculoskeletal: Negative.   Skin: Negative.   Neurological: Negative for dizziness and headaches.  Endo/Heme/Allergies: Negative.   Psychiatric/Behavioral: Positive for depression (Stabilized with medication prior to discharge), hallucinations (Hx. Psychosis (stable)) and substance abuse (Hx. THC use disorder). Negative for memory loss and suicidal ideas. The patient has insomnia (Stabilized with medication prior to discharge). The patient is not nervous/anxious (Stable).     Blood pressure (!) 141/93, pulse 77, temperature (!) 97.5 F (36.4 C), temperature source Oral, resp. rate 18, height  (1.778 m), weight 83.9 kg, SpO2 100 %.Body mass index is 26.54 kg/m.  See Md's discharge SRA   Have you used any form of tobacco in the last 30 days? (Cigarettes, Smokeless Tobacco, Cigars, and/or Pipes): Yes  Has this patient used any form of tobacco in the last 30 days? (Cigarettes, Smokeless Tobacco, Cigars, and/or Pipes): N/A  Blood Alcohol level:  Lab Results  Component Value Date   ETH <10 12/18/2018   ETH <10 12/04/2018   Metabolic Disorder Labs:  Lab Results   Component Value Date   HGBA1C 5.5 09/03/2014   MPG 111 09/03/2014   No results found for: PROLACTIN Lab Results  Component Value Date   CHOL 112 09/03/2014   TRIG 65 09/03/2014   HDL 32 (L) 09/03/2014   CHOLHDL 3.5 09/03/2014   VLDL 13 09/03/2014   LDLCALC 67 09/03/2014   See Psychiatric Specialty Exam and Suicide Risk Assessment completed by Attending Physician prior to discharge.  Discharge destination:  Home  Is patient on multiple antipsychotic therapies at discharge:  No   Has Patient had three or more failed trials of antipsychotic monotherapy by history:  No  Recommended Plan for Multiple Antipsychotic Therapies: NA  Allergies as of 12/24/2018      Reactions   Shrimp [shellfish Allergy] Anaphylaxis   Pork-derived Products    Does not eat it      Medication List    STOP taking these medications  lamoTRIgine 200 MG tablet Commonly known as:  LAMICTAL   mirtazapine 15 MG tablet Commonly known as:  REMERON Replaced by:  mirtazapine 15 MG disintegrating tablet   OLANZapine 20 MG tablet Commonly known as:  ZYPREXA     TAKE these medications     Indication  atomoxetine 10 MG capsule Commonly known as:  STRATTERA Take 1 capsule (10 mg total) by mouth daily. Start taking on:  December 25, 2018  Indication:  Attention Deficit Hyperactivity Disorder   carbamazepine 200 MG 12 hr tablet Commonly known as:  TEGRETOL XR 1 in am 2 at h s  Indication:  Manic-Depression, Depressive Phase of Manic-Depression   hydrOXYzine 50 MG tablet Commonly known as:  ATARAX/VISTARIL Take 1 tablet (50 mg total) by mouth 3 (three) times daily as needed for anxiety. What changed:    when to take this  reasons to take this  Indication:  Feeling Anxious   mirtazapine 15 MG disintegrating tablet Commonly known as:  REMERON SOL-TAB Take 1 tablet (15 mg total) by mouth at bedtime. Replaces:  mirtazapine 15 MG tablet  Indication:  Major Depressive Disorder   QUEtiapine 200  MG tablet Commonly known as:  SEROQUEL Take 1 tablet (200 mg total) by mouth 3 (three) times daily.  Indication:  Manic Phase of Manic-Depression      Follow-up Information    [facility] Follow up on 12/30/2018.   Why:  Hospital follow up appointment with [name] is Monday, 3/2 at 2:15p. You will also work with the [name] from [place]. Please contact [place], [phone], or [name], [phone], to set up the first meeting.  Contact information: [address] [place] [address] [phone]          Follow-up recommendations: Activity:  As tolerated Diet: As recommended by your primary care doctor. Keep all scheduled follow-up appointments as recommended.   Comments: Patient is instructed prior to discharge to: Take all medications as prescribed by his/her mental healthcare provider. Report any adverse effects and or reactions from the medicines to his/her outpatient provider promptly. Patient has been instructed & cautioned: To not engage in alcohol and or illegal drug use while on prescription medicines. In the event of worsening symptoms, patient is instructed to call the crisis hotline, 911 and or go to the nearest ED for appropriate evaluation and treatment of symptoms. To follow-up with his/her primary care provider for your other medical issues, concerns and or health care needs.   Signed: Armandina Stammer, NP, PMHNP, FNP-BC 12/24/2018, 2:33 PM

## 2018-12-24 NOTE — Progress Notes (Signed)
CSW and [name] from [employer] exchanged phone messages.  [name] unable to meet with pt yesterday at [facility] because he was sedated.  TCT does have IPRS slot for pt and will move forward with him.   CSW spoke to both pt and pt sister regarding this plan and gave sister team lead phone number.  Pt also states he is willing to work with the team and stay on his meds so that he doesn't have anymore "bipolar rages." [name], MSW, LCSW Clinical Social Worker 12/24/2018 9:14 AM

## 2018-12-24 NOTE — Plan of Care (Signed)
Pt did not attend recreation therapy group sessions.   Philip [name], LRT/CTRS 

## 2018-12-24 NOTE — Progress Notes (Signed)
  [facility] Adult Case Management Discharge Plan :  Will you be returning to the same living situation after discharge:  Yes,  mother's home At discharge, do you have transportation home?: Yes,  sister Do you have the ability to pay for your medications: No.Will work with [employer].   Release of information consent forms completed and in the chart;  Patient's signature needed at discharge.  Patient to Follow up at: Follow-up Information    [facility] Follow up on 12/30/2018.   Why:  Hospital follow up appointment with [name] is Monday, 3/2 at 2:15p. You will also work with the [name] from [place]. Please contact [place], [phone], or [name], [phone], to set up the first meeting.  Contact information: [address] [place] [address] [phone]           Next level of care provider has access to [facility] Link:no  Safety Planning and Suicide Prevention discussed: Yes,  with sister  Have you used any form of tobacco in the last 30 days? (Cigarettes, Smokeless Tobacco, Cigars, and/or Pipes): Yes  Has patient been referred to the Quitline?: Patient refused referral  Patient has been referred for addiction treatment: Yes, [facility].   Lorri Frederick, LCSW 12/24/2018, 9:16 AM

## 2018-12-24 NOTE — BHH Suicide Risk Assessment (Signed)
[facility] Discharge Suicide Risk Assessment   Principal Problem: Exacerbation and underlying schizoaffective condition Discharge Diagnoses: Active Problems:   Schizoaffective disorder (HCC)   Total Time spent with patient: 45 minutes  Currently alert and oriented generally except to date still very intrusive and focused on discharge but no acute hallucinations or thoughts of harming self or others probably  Mental Status Per Nursing Assessment::   On Admission:  Suicide plan  Demographic Factors:  Male  Loss Factors: Decrease in vocational status  Historical Factors: Impulsivity  Risk Reduction Factors:   Religious beliefs about death  Continued Clinical Symptoms:  Chronic impulsivity and baseline hypomania  Cognitive Features That Contribute To Risk:  Loss of executive function    Suicide Risk:  Minimal: No identifiable suicidal ideation.  Patients presenting with no risk factors but with morbid ruminations; may be classified as minimal risk based on the severity of the depressive symptoms  Follow-up Information    [facility] Follow up on 12/30/2018.   Why:  Hospital follow up appointment with [name] is Monday, 3/2 at 2:15p. You will also work with the [name] from [place]. Please contact [name], [phone], or [name], [phone], to set up the first meeting.  Contact information: [address] [place] [address] [phone]           Plan Of Care/Follow-up recommendations:  Activity:  full  Philip Valadez, MD 12/24/2018, 9:30 AM

## 2018-12-24 NOTE — Progress Notes (Signed)
Nursing 1:1 note D:Pt observed in the hallway. RR even and unlabored. No distress noted. A: 1:1 observation continues for safety  R: pt remains safe  

## 2018-12-24 NOTE — Progress Notes (Signed)
Recreation Therapy Notes  INPATIENT RECREATION TR PLAN  Patient Details Name: Philip [name] MRN: [id number] DOB: August 20, 1991 Today's Date: 12/24/2018  Rec Therapy Plan Is patient appropriate for Therapeutic Recreation?: Yes Treatment times per week: about 3 days Estimated Length of Stay: 5-7 days TR Treatment/Interventions: Group participation (Comment)  Discharge Criteria Pt will be discharged from therapy if:: Discharged Treatment plan/goals/alternatives discussed and agreed upon by:: Patient/family  Discharge Summary Short term goals set: See patient care plan Short term goals met: Not met Reason goals not met: Pt did not attend recreation therapy group sessions. Therapeutic equipment acquired: N/A Reason patient discharged from therapy: Discharge from hospital Pt/family agrees with progress & goals achieved: Yes Date patient discharged from therapy: 12/24/18     Victorino Sparrow, LRT/CTRS  Ria Comment, Alyshia Kernan A 12/24/2018, 9:31 AM

## 2022-05-19 ENCOUNTER — Encounter (HOSPITAL_BASED_OUTPATIENT_CLINIC_OR_DEPARTMENT_OTHER): Payer: Self-pay | Admitting: Emergency Medicine

## 2022-05-19 ENCOUNTER — Emergency Department (HOSPITAL_BASED_OUTPATIENT_CLINIC_OR_DEPARTMENT_OTHER)
Admission: EM | Admit: 2022-05-19 | Discharge: 2022-05-19 | Disposition: A | Discharge status: Home / Self Care | Payer: Medicaid Other | Attending: Emergency Medicine | Admitting: Emergency Medicine

## 2022-05-19 ENCOUNTER — Other Ambulatory Visit: Payer: Self-pay

## 2022-05-19 DIAGNOSIS — I10 Essential (primary) hypertension: Secondary | ICD-10-CM | POA: Diagnosis not present

## 2022-05-19 DIAGNOSIS — Z79899 Other long term (current) drug therapy: Secondary | ICD-10-CM | POA: Diagnosis not present

## 2022-05-19 DIAGNOSIS — R000 Tachycardia, unspecified: Secondary | ICD-10-CM | POA: Diagnosis not present

## 2022-05-19 LAB — CBC WITH DIFFERENTIAL/PLATELET
Abs Immature Granulocytes: 0.02 10*3/uL (ref 0.00–0.07)
Basophils Absolute: 0 10*3/uL (ref 0.0–0.1)
Basophils Relative: 0 %
Eosinophils Absolute: 0 10*3/uL (ref 0.0–0.5)
Eosinophils Relative: 0 %
HCT: 41.1 % (ref 39.0–52.0)
Hemoglobin: 13.6 g/dL (ref 13.0–17.0)
Immature Granulocytes: 0 %
Lymphocytes Relative: 23 %
Lymphs Abs: 2.1 10*3/uL (ref 0.7–4.0)
MCH: 28.8 pg (ref 26.0–34.0)
MCHC: 33.1 g/dL (ref 30.0–36.0)
MCV: 86.9 fL (ref 80.0–100.0)
Monocytes Absolute: 0.6 10*3/uL (ref 0.1–1.0)
Monocytes Relative: 7 %
Neutro Abs: 6.3 10*3/uL (ref 1.7–7.7)
Neutrophils Relative %: 70 %
Platelets: 349 10*3/uL (ref 150–400)
RBC: 4.73 MIL/uL (ref 4.22–5.81)
RDW: 12.5 % (ref 11.5–15.5)
WBC: 9 10*3/uL (ref 4.0–10.5)
nRBC: 0 % (ref 0.0–0.2)

## 2022-05-19 LAB — BASIC METABOLIC PANEL
Anion gap: 13 (ref 5–15)
BUN: 9 mg/dL (ref 6–20)
CO2: 23 mmol/L (ref 22–32)
Calcium: 10.3 mg/dL (ref 8.9–10.3)
Chloride: 103 mmol/L (ref 98–111)
Creatinine, Ser: 0.99 mg/dL (ref 0.61–1.24)
GFR, Estimated: >60 mL/min (ref >60)
Glucose, Bld: 93 mg/dL (ref 70–99)
Potassium: 3.6 mmol/L (ref 3.5–5.1)
Sodium: 139 mmol/L (ref 135–145)

## 2022-05-19 MED ORDER — LISINOPRIL 5 MG PO TABS: 5.0000 mg | ORAL_TABLET | Freq: Every day | ORAL | 1 refills | Status: DC

## 2022-05-19 NOTE — ED Notes (Signed)
Discharge instructions, follow up care, and prescription reviewed and explained, pt verbalized understanding. Pt had no further questions and was caox4 and ambulatory on d/c. 

## 2022-05-19 NOTE — ED Provider Notes (Signed)
[facility] EMERGENCY DEPT Provider Note   CSN: [id number] Arrival date & time: 05/19/22  1415     History  Chief Complaint  Patient presents with   Hypertension    Philip [name] is a 31 y.o. male.  Pt complains of elevated blood pressure.  Patient reports he is not currently on any medicines for his high blood pressure.  Patient reports he is on psychiatric medicines and has been compliant with his medications.  Patient denies any headache he denies any chest pain he has not had any abdominal pain.  Patient is here with a family member who reports she recently was started on blood pressure medicine and is concerned that patient needs to be on blood pressure medicine as well  The history is provided by the patient. No language interpreter was used.  Hypertension This is a new problem. The current episode started more than 2 days ago. The problem occurs constantly. Nothing aggravates the symptoms. Nothing relieves the symptoms. He has tried nothing for the symptoms. The treatment provided no relief.       Home Medications Prior to Admission medications   Medication Sig Start Date End Date Taking? Authorizing Provider  atomoxetine (STRATTERA) 10 MG capsule Take 1 capsule (10 mg total) by mouth daily. 12/25/18   Johnn Hai, MD  carbamazepine (TEGRETOL XR) 200 MG 12 hr tablet 1 in am 2 at h s 12/24/18   Johnn Hai, MD  desmopressin (DDAVP) 0.2 MG tablet Take 0.2 mg by mouth.  09/07/22  [provider]  hydrOXYzine (ATARAX/VISTARIL) 50 MG tablet Take 1 tablet (50 mg total) by mouth 3 (three) times daily as needed for anxiety. 12/24/18   Johnn Hai, MD  mirtazapine (REMERON SOL-TAB) 15 MG disintegrating tablet Take 1 tablet (15 mg total) by mouth at bedtime. 12/24/18   Johnn Hai, MD  QUEtiapine (SEROQUEL) 200 MG tablet Take 1 tablet (200 mg total) by mouth 3 (three) times daily. 12/24/18   Johnn Hai, MD  rosuvastatin (CRESTOR) 20 MG tablet Take 20 mg by  mouth.    [provider]      Allergies    Shrimp [shellfish allergy] and Pork-derived products    Review of Systems   Review of Systems  All other systems reviewed and are negative.   Physical Exam Updated Vital Signs BP (!) 148/104   Pulse 98   Temp 99 F (37.2 C)   Resp 19   Ht 6' (1.829 m)   Wt 95.3 kg   SpO2 100%   BMI 28.48 kg/m  Physical Exam Vitals and nursing note reviewed.  Constitutional:      Appearance: He is well-developed.  HENT:     Head: Normocephalic.  Cardiovascular:     Rate and Rhythm: Normal rate and regular rhythm.  Pulmonary:     Effort: Pulmonary effort is normal.  Abdominal:     General: There is no distension.  Musculoskeletal:        General: Normal range of motion.     Cervical back: Normal range of motion.  Neurological:     General: No focal deficit present.     Mental Status: He is alert and oriented to person, place, and time.  Psychiatric:        Mood and Affect: Mood normal.     ED Results / Procedures / Treatments   Labs (all labs ordered are listed, but only abnormal results are displayed) Labs Reviewed  CBC WITH DIFFERENTIAL/PLATELET  BASIC METABOLIC PANEL  EKG EKG Interpretation  Date/Time:  Friday May 19 2022 16:41:28 EDT Ventricular Rate:  105 PR Interval:  140 QRS Duration: 82 QT Interval:  336 QTC Calculation: 444 R Axis:   86 Text Interpretation: Sinus tachycardia Borderline T wave abnormalities when compared to prior, similar appearance wo ECG in 2019. No STEMI  Confirmed by Antony Blackbird [phone]) on 05/19/2022 5:39:47 PM  Radiology No results found.  Procedures Procedures    Medications Ordered in ED Medications - No data to display  ED Course/ Medical Decision Making/ A&P                           Medical Decision Making Patient complains of elevated blood pressure  Amount and/or Complexity of Data Reviewed Independent Historian:     Details: Patient is here with his sister  who is supportive and concerned about his blood pressure External Data Reviewed: notes.    Details: Review his notes from psychiatric services reviewed Labs: ordered.    Details: Labs ordered reviewed and interpreted BC and c-Met are normal ECG/medicine tests: ordered and independent interpretation performed.    Details: EKG shows a sinus tachycardia with borderline T wave abnormalities no change since EKG in 2019  Risk Prescription drug management. Risk Details: Patient's blood pressure has remained slightly elevated during his stay in the emergency department in the 150/100 range.  I will start patient on lisinopril at a low dose patient is advised to schedule to see his primary care physician for recheck           Final Clinical Impression(s) / ED Diagnoses Final diagnoses:  Hypertension, unspecified type    Rx / DC Orders ED Discharge Orders          Ordered    lisinopril (ZESTRIL) 5 MG tablet  Daily        05/19/22 1823              Sidney Ace 05/19/22 1824    Tegeler, Gwenyth Allegra, MD 05/20/22 0009

## 2022-05-19 NOTE — ED Triage Notes (Signed)
Pt arrives to ED with c/o high blood pressure. Pt reports his BP reading at home was elevated. He states he has been up over 24hrs.

## 2022-05-19 NOTE — Discharge Instructions (Signed)
Return if any problems.  See your Physicain for recheck in 1 week  

## 2022-05-23 ENCOUNTER — Emergency Department (HOSPITAL_COMMUNITY)
Admission: EM | Admit: 2022-05-23 | Discharge: 2022-05-23 | Discharge status: Against Medical Advice | Payer: Medicaid Other | Source: Home / Self Care

## 2022-05-23 ENCOUNTER — Other Ambulatory Visit: Payer: Self-pay

## 2022-05-23 ENCOUNTER — Encounter (HOSPITAL_COMMUNITY): Payer: Self-pay

## 2022-05-23 DIAGNOSIS — Z5321 Procedure and treatment not carried out due to patient leaving prior to being seen by health care provider: Secondary | ICD-10-CM | POA: Insufficient documentation

## 2022-05-23 DIAGNOSIS — F419 Anxiety disorder, unspecified: Secondary | ICD-10-CM | POA: Insufficient documentation

## 2022-05-23 LAB — CBC WITH DIFFERENTIAL/PLATELET
Abs Immature Granulocytes: 0.05 10*3/uL (ref 0.00–0.07)
Basophils Absolute: 0 10*3/uL (ref 0.0–0.1)
Basophils Relative: 0 %
Eosinophils Absolute: 0 10*3/uL (ref 0.0–0.5)
Eosinophils Relative: 0 %
HCT: 44.2 % (ref 39.0–52.0)
Hemoglobin: 14.7 g/dL (ref 13.0–17.0)
Immature Granulocytes: 0 %
Lymphocytes Relative: 13 %
Lymphs Abs: 1.8 10*3/uL (ref 0.7–4.0)
MCH: 28.5 pg (ref 26.0–34.0)
MCHC: 33.3 g/dL (ref 30.0–36.0)
MCV: 85.8 fL (ref 80.0–100.0)
Monocytes Absolute: 1.1 10*3/uL — ABNORMAL HIGH (ref 0.1–1.0)
Monocytes Relative: 8 %
Neutro Abs: 11.1 10*3/uL — ABNORMAL HIGH (ref 1.7–7.7)
Neutrophils Relative %: 79 %
Platelets: 409 10*3/uL — ABNORMAL HIGH (ref 150–400)
RBC: 5.15 MIL/uL (ref 4.22–5.81)
RDW: 12.3 % (ref 11.5–15.5)
WBC: 14.1 10*3/uL — ABNORMAL HIGH (ref 4.0–10.5)
nRBC: 0 % (ref 0.0–0.2)

## 2022-05-23 LAB — SALICYLATE LEVEL: Salicylate Lvl: <7 mg/dL — ABNORMAL LOW (ref 7.0–30.0)

## 2022-05-23 LAB — ACETAMINOPHEN LEVEL: Acetaminophen (Tylenol), Serum: <10 ug/mL — ABNORMAL LOW (ref 10–30)

## 2022-05-23 LAB — COMPREHENSIVE METABOLIC PANEL
ALT: 29 U/L (ref 0–44)
AST: 42 U/L — ABNORMAL HIGH (ref 15–41)
Albumin: 4.9 g/dL (ref 3.5–5.0)
Alkaline Phosphatase: 111 U/L (ref 38–126)
Anion gap: 17 — ABNORMAL HIGH (ref 5–15)
BUN: 20 mg/dL (ref 6–20)
CO2: 20 mmol/L — ABNORMAL LOW (ref 22–32)
Calcium: 10.4 mg/dL — ABNORMAL HIGH (ref 8.9–10.3)
Chloride: 106 mmol/L (ref 98–111)
Creatinine, Ser: 2.7 mg/dL — ABNORMAL HIGH (ref 0.61–1.24)
GFR, Estimated: 31 mL/min — ABNORMAL LOW (ref >60)
Glucose, Bld: 135 mg/dL — ABNORMAL HIGH (ref 70–99)
Potassium: 3.3 mmol/L — ABNORMAL LOW (ref 3.5–5.1)
Sodium: 143 mmol/L (ref 135–145)
Total Bilirubin: 1.3 mg/dL — ABNORMAL HIGH (ref 0.3–1.2)
Total Protein: 8.9 g/dL — ABNORMAL HIGH (ref 6.5–8.1)

## 2022-05-23 LAB — ETHANOL: Alcohol, Ethyl (B): <10 mg/dL (ref <10)

## 2022-05-23 NOTE — ED Triage Notes (Signed)
PT reports he is here because he is under a lot of stress and feeling very anxious, states he is here to get some help. Pt denies SI or HI.
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
## 2022-05-24 ENCOUNTER — Emergency Department (HOSPITAL_COMMUNITY): Payer: Medicaid Other

## 2022-05-24 ENCOUNTER — Other Ambulatory Visit: Payer: Self-pay

## 2022-05-24 ENCOUNTER — Inpatient Hospital Stay (HOSPITAL_COMMUNITY)
Admission: EM | Admit: 2022-05-24 | Discharge: 2022-06-01 | DRG: 917 | Disposition: A | Discharge status: Other Acute Inpatient Hospital | Payer: Medicaid Other | Attending: Surgery | Admitting: Surgery

## 2022-05-24 DIAGNOSIS — Z91014 Allergy to mammalian meats: Secondary | ICD-10-CM | POA: Diagnosis not present

## 2022-05-24 DIAGNOSIS — S1193XA Puncture wound without foreign body of unspecified part of neck, initial encounter: Secondary | ICD-10-CM | POA: Diagnosis present

## 2022-05-24 DIAGNOSIS — T43592A Poisoning by other antipsychotics and neuroleptics, intentional self-harm, initial encounter: Principal | ICD-10-CM | POA: Diagnosis present

## 2022-05-24 DIAGNOSIS — T1491XA Suicide attempt, initial encounter: Secondary | ICD-10-CM | POA: Diagnosis not present

## 2022-05-24 DIAGNOSIS — Z8249 Family history of ischemic heart disease and other diseases of the circulatory system: Secondary | ICD-10-CM | POA: Diagnosis not present

## 2022-05-24 DIAGNOSIS — Z20822 Contact with and (suspected) exposure to covid-19: Secondary | ICD-10-CM | POA: Diagnosis present

## 2022-05-24 DIAGNOSIS — E872 Acidosis, unspecified: Secondary | ICD-10-CM | POA: Diagnosis present

## 2022-05-24 DIAGNOSIS — I959 Hypotension, unspecified: Secondary | ICD-10-CM | POA: Diagnosis not present

## 2022-05-24 DIAGNOSIS — Z91013 Allergy to seafood: Secondary | ICD-10-CM | POA: Diagnosis not present

## 2022-05-24 DIAGNOSIS — Z79899 Other long term (current) drug therapy: Secondary | ICD-10-CM

## 2022-05-24 DIAGNOSIS — S1181XA Laceration without foreign body of other specified part of neck, initial encounter: Secondary | ICD-10-CM | POA: Diagnosis present

## 2022-05-24 DIAGNOSIS — S1083XA Contusion of other specified part of neck, initial encounter: Secondary | ICD-10-CM | POA: Diagnosis present

## 2022-05-24 DIAGNOSIS — J9601 Acute respiratory failure with hypoxia: Secondary | ICD-10-CM | POA: Diagnosis present

## 2022-05-24 DIAGNOSIS — T50902A Poisoning by unspecified drugs, medicaments and biological substances, intentional self-harm, initial encounter: Secondary | ICD-10-CM | POA: Diagnosis not present

## 2022-05-24 DIAGNOSIS — R451 Restlessness and agitation: Secondary | ICD-10-CM | POA: Diagnosis not present

## 2022-05-24 DIAGNOSIS — G929 Unspecified toxic encephalopathy: Secondary | ICD-10-CM | POA: Diagnosis present

## 2022-05-24 DIAGNOSIS — F419 Anxiety disorder, unspecified: Secondary | ICD-10-CM | POA: Diagnosis present

## 2022-05-24 DIAGNOSIS — F250 Schizoaffective disorder, bipolar type: Secondary | ICD-10-CM | POA: Diagnosis present

## 2022-05-24 DIAGNOSIS — Z781 Physical restraint status: Secondary | ICD-10-CM

## 2022-05-24 DIAGNOSIS — R4182 Altered mental status, unspecified: Secondary | ICD-10-CM

## 2022-05-24 DIAGNOSIS — E785 Hyperlipidemia, unspecified: Secondary | ICD-10-CM | POA: Diagnosis present

## 2022-05-24 DIAGNOSIS — F909 Attention-deficit hyperactivity disorder, unspecified type: Secondary | ICD-10-CM | POA: Diagnosis present

## 2022-05-24 DIAGNOSIS — E876 Hypokalemia: Secondary | ICD-10-CM | POA: Diagnosis present

## 2022-05-24 DIAGNOSIS — S1191XA Laceration without foreign body of unspecified part of neck, initial encounter: Secondary | ICD-10-CM | POA: Diagnosis not present

## 2022-05-24 DIAGNOSIS — F17210 Nicotine dependence, cigarettes, uncomplicated: Secondary | ICD-10-CM | POA: Diagnosis present

## 2022-05-24 DIAGNOSIS — X781XXA Intentional self-harm by knife, initial encounter: Secondary | ICD-10-CM | POA: Diagnosis present

## 2022-05-24 LAB — RAPID URINE DRUG SCREEN, HOSP PERFORMED
Amphetamines: NOT DETECTED
Barbiturates: NOT DETECTED
Benzodiazepines: NOT DETECTED
Cocaine: NOT DETECTED
Opiates: NOT DETECTED
Tetrahydrocannabinol: POSITIVE — AB

## 2022-05-24 LAB — COMPREHENSIVE METABOLIC PANEL
ALT: 27 U/L (ref 0–44)
ALT: 36 U/L (ref 0–44)
AST: 53 U/L — ABNORMAL HIGH (ref 15–41)
AST: 61 U/L — ABNORMAL HIGH (ref 15–41)
Albumin: 3 g/dL — ABNORMAL LOW (ref 3.5–5.0)
Albumin: 3.4 g/dL — ABNORMAL LOW (ref 3.5–5.0)
Alkaline Phosphatase: 71 U/L (ref 38–126)
Alkaline Phosphatase: 88 U/L (ref 38–126)
Anion gap: 12 (ref 5–15)
Anion gap: 13 (ref 5–15)
BUN: 26 mg/dL — ABNORMAL HIGH (ref 6–20)
BUN: 26 mg/dL — ABNORMAL HIGH (ref 6–20)
CO2: 12 mmol/L — ABNORMAL LOW (ref 22–32)
CO2: 21 mmol/L — ABNORMAL LOW (ref 22–32)
Calcium: 6.3 mg/dL — CL (ref 8.9–10.3)
Calcium: 8.4 mg/dL — ABNORMAL LOW (ref 8.9–10.3)
Chloride: 118 mmol/L — ABNORMAL HIGH (ref 98–111)
Chloride: 104 mmol/L (ref 98–111)
Creatinine, Ser: 2.22 mg/dL — ABNORMAL HIGH (ref 0.61–1.24)
Creatinine, Ser: 2.33 mg/dL — ABNORMAL HIGH (ref 0.61–1.24)
GFR, Estimated: 39 mL/min — ABNORMAL LOW (ref >60)
GFR, Estimated: 37 mL/min — ABNORMAL LOW (ref >60)
Glucose, Bld: 102 mg/dL — ABNORMAL HIGH (ref 70–99)
Glucose, Bld: 99 mg/dL (ref 70–99)
Potassium: 2 mmol/L — CL (ref 3.5–5.1)
Potassium: 3.9 mmol/L (ref 3.5–5.1)
Sodium: 142 mmol/L (ref 135–145)
Sodium: 138 mmol/L (ref 135–145)
Total Bilirubin: 0.6 mg/dL (ref 0.3–1.2)
Total Bilirubin: 0.8 mg/dL (ref 0.3–1.2)
Total Protein: 5.4 g/dL — ABNORMAL LOW (ref 6.5–8.1)
Total Protein: 6 g/dL — ABNORMAL LOW (ref 6.5–8.1)

## 2022-05-24 LAB — DIGOXIN LEVEL: Digoxin Level: <0.2 ng/mL — ABNORMAL LOW (ref 0.8–2.0)

## 2022-05-24 LAB — MAGNESIUM: Magnesium: 1.6 mg/dL — ABNORMAL LOW (ref 1.7–2.4)

## 2022-05-24 LAB — CBC WITH DIFFERENTIAL/PLATELET
Abs Immature Granulocytes: 0.05 10*3/uL (ref 0.00–0.07)
Basophils Absolute: 0.1 10*3/uL (ref 0.0–0.1)
Basophils Relative: 0 %
Eosinophils Absolute: 0 10*3/uL (ref 0.0–0.5)
Eosinophils Relative: 0 %
HCT: 41 % (ref 39.0–52.0)
Hemoglobin: 13.7 g/dL (ref 13.0–17.0)
Immature Granulocytes: 0 %
Lymphocytes Relative: 21 %
Lymphs Abs: 2.7 10*3/uL (ref 0.7–4.0)
MCH: 28.7 pg (ref 26.0–34.0)
MCHC: 33.4 g/dL (ref 30.0–36.0)
MCV: 85.8 fL (ref 80.0–100.0)
Monocytes Absolute: 1.2 10*3/uL — ABNORMAL HIGH (ref 0.1–1.0)
Monocytes Relative: 9 %
Neutro Abs: 9.1 10*3/uL — ABNORMAL HIGH (ref 1.7–7.7)
Neutrophils Relative %: 70 %
Platelets: 387 10*3/uL (ref 150–400)
RBC: 4.78 MIL/uL (ref 4.22–5.81)
RDW: 12.3 % (ref 11.5–15.5)
WBC: 13.1 10*3/uL — ABNORMAL HIGH (ref 4.0–10.5)
nRBC: 0 % (ref 0.0–0.2)

## 2022-05-24 LAB — I-STAT ARTERIAL BLOOD GAS, ED
Acid-base deficit: 4 mmol/L — ABNORMAL HIGH (ref 0.0–2.0)
Bicarbonate: 21.3 mmol/L (ref 20.0–28.0)
Calcium, Ion: 1.11 mmol/L — ABNORMAL LOW (ref 1.15–1.40)
HCT: 34 % — ABNORMAL LOW (ref 39.0–52.0)
Hemoglobin: 11.6 g/dL — ABNORMAL LOW (ref 13.0–17.0)
O2 Saturation: 95 %
Patient temperature: 97.9
Potassium: 3.5 mmol/L (ref 3.5–5.1)
Sodium: 135 mmol/L (ref 135–145)
TCO2: 22 mmol/L (ref 22–32)
pCO2 arterial: 37.6 mmHg (ref 32–48)
pH, Arterial: 7.36 (ref 7.35–7.45)
pO2, Arterial: 79 mmHg — ABNORMAL LOW (ref 83–108)

## 2022-05-24 LAB — CBG MONITORING, ED
Glucose-Capillary: 101 mg/dL — ABNORMAL HIGH (ref 70–99)
Glucose-Capillary: 119 mg/dL — ABNORMAL HIGH (ref 70–99)

## 2022-05-24 LAB — APTT: aPTT: 23 seconds — ABNORMAL LOW (ref 24–36)

## 2022-05-24 LAB — PROTIME-INR
INR: 1.2 (ref 0.8–1.2)
Prothrombin Time: 14.6 seconds (ref 11.4–15.2)

## 2022-05-24 LAB — LACTIC ACID, PLASMA: Lactic Acid, Venous: 2.9 mmol/L (ref 0.5–1.9)

## 2022-05-24 LAB — ETHANOL: Alcohol, Ethyl (B): 34 mg/dL — ABNORMAL HIGH (ref <10)

## 2022-05-24 LAB — CARBAMAZEPINE LEVEL, TOTAL: Carbamazepine Lvl: <2 ug/mL — ABNORMAL LOW (ref 4.0–12.0)

## 2022-05-24 LAB — TYPE AND SCREEN
ABO/RH(D): O POS
ABO/RH(D): O POS
Antibody Screen: NEGATIVE
Antibody Screen: NEGATIVE

## 2022-05-24 LAB — SALICYLATE LEVEL: Salicylate Lvl: <7 mg/dL — ABNORMAL LOW (ref 7.0–30.0)

## 2022-05-24 LAB — AMMONIA: Ammonia: 14 umol/L (ref 9–35)

## 2022-05-24 LAB — ACETAMINOPHEN LEVEL: Acetaminophen (Tylenol), Serum: <10 ug/mL — ABNORMAL LOW (ref 10–30)

## 2022-05-24 LAB — MRSA NEXT GEN BY PCR, NASAL: MRSA by PCR Next Gen: NOT DETECTED

## 2022-05-24 MED ORDER — PROPOFOL 1000 MG/100ML IV EMUL
5.0000 ug/kg/min | INTRAVENOUS | Status: DC
  Administered 2022-05-25: 20 ug/kg/min via INTRAVENOUS
  Administered 2022-05-25: 50 ug/kg/min via INTRAVENOUS
  Administered 2022-05-25: 40 ug/kg/min via INTRAVENOUS
  Administered 2022-05-25: 80 ug/kg/min via INTRAVENOUS
  Administered 2022-05-25: 70 ug/kg/min via INTRAVENOUS
  Administered 2022-05-26: 40 ug/kg/min via INTRAVENOUS
  Administered 2022-05-26 (×2): 30 ug/kg/min via INTRAVENOUS
  Administered 2022-05-26: 50 ug/kg/min via INTRAVENOUS
  Administered 2022-05-27: 60 ug/kg/min via INTRAVENOUS
  Administered 2022-05-27: 70 ug/kg/min via INTRAVENOUS
  Administered 2022-05-27: 40 ug/kg/min via INTRAVENOUS
  Administered 2022-05-27 (×2): 30 ug/kg/min via INTRAVENOUS
  Administered 2022-05-27: 60 ug/kg/min via INTRAVENOUS
  Administered 2022-05-28: 50 ug/kg/min via INTRAVENOUS
  Administered 2022-05-28 (×2): 30 ug/kg/min via INTRAVENOUS
  Administered 2022-05-28: 40 ug/kg/min via INTRAVENOUS
  Administered 2022-05-28: 50 ug/kg/min via INTRAVENOUS
  Administered 2022-05-29: 40 ug/kg/min via INTRAVENOUS
  Administered 2022-05-29: 15.009 ug/kg/min via INTRAVENOUS
  Filled 2022-05-24 (×23): qty 100

## 2022-05-24 MED ORDER — SODIUM CHLORIDE 0.9 % IV SOLN
3.0000 g | Freq: Four times a day (QID) | INTRAVENOUS | Status: DC
  Administered 2022-05-25 – 2022-05-28 (×15): 3 g via INTRAVENOUS
  Filled 2022-05-24 (×15): qty 8

## 2022-05-24 MED ORDER — ONDANSETRON HCL 4 MG/2ML IJ SOLN
INTRAMUSCULAR | Status: AC
  Filled 2022-05-24: qty 2

## 2022-05-24 MED ORDER — ORAL CARE MOUTH RINSE
15.0000 mL | OROMUCOSAL | Status: DC
  Administered 2022-05-24: 15 mL via OROMUCOSAL

## 2022-05-24 MED ORDER — MAGNESIUM SULFATE 2 GM/50ML IV SOLN
2.0000 g | Freq: Once | INTRAVENOUS | Status: AC
  Administered 2022-05-24: 2 g via INTRAVENOUS
  Filled 2022-05-24: qty 50

## 2022-05-24 MED ORDER — SODIUM CHLORIDE 0.9 % IV SOLN
250.0000 mL | INTRAVENOUS | Status: DC
  Administered 2022-05-24 – 2022-05-31 (×3): 250 mL via INTRAVENOUS

## 2022-05-24 MED ORDER — MIDAZOLAM HCL 2 MG/2ML IJ SOLN
2.0000 mg | INTRAMUSCULAR | Status: AC | PRN
  Administered 2022-05-24 (×3): 2 mg via INTRAVENOUS
  Filled 2022-05-24 (×3): qty 2

## 2022-05-24 MED ORDER — KETAMINE HCL 50 MG/5ML IJ SOSY
1.0000 mg/kg | PREFILLED_SYRINGE | Freq: Once | INTRAMUSCULAR | Status: AC
  Administered 2022-05-24: 89 mg via INTRAVENOUS
  Filled 2022-05-24: qty 10

## 2022-05-24 MED ORDER — LACTATED RINGERS IV BOLUS
1000.0000 mL | Freq: Once | INTRAVENOUS | Status: AC
Start: 2022-05-24 — End: 2022-05-24
  Administered 2022-05-24: 1000 mL via INTRAVENOUS

## 2022-05-24 MED ORDER — ONDANSETRON 4 MG PO TBDP
4.0000 mg | ORAL_TABLET | Freq: Four times a day (QID) | ORAL | Status: DC | PRN
  Filled 2022-05-24: qty 1

## 2022-05-24 MED ORDER — NALOXONE HCL 2 MG/2ML IJ SOSY
PREFILLED_SYRINGE | INTRAMUSCULAR | Status: AC
  Filled 2022-05-24: qty 2

## 2022-05-24 MED ORDER — PANTOPRAZOLE SODIUM 40 MG IV SOLR
40.0000 mg | Freq: Every day | INTRAVENOUS | Status: DC
  Administered 2022-05-24: 40 mg via INTRAVENOUS
  Filled 2022-05-24: qty 10

## 2022-05-24 MED ORDER — PROPOFOL 1000 MG/100ML IV EMUL
INTRAVENOUS | Status: AC
  Filled 2022-05-24: qty 100

## 2022-05-24 MED ORDER — LACTATED RINGERS IV BOLUS
1000.0000 mL | Freq: Once | INTRAVENOUS | Status: AC
  Administered 2022-05-24: 1000 mL via INTRAVENOUS

## 2022-05-24 MED ORDER — NALOXONE HCL 2 MG/2ML IJ SOSY
1.0000 mg | PREFILLED_SYRINGE | Freq: Once | INTRAMUSCULAR | Status: AC
  Administered 2022-05-24: 1 mg via INTRAVENOUS

## 2022-05-24 MED ORDER — FENTANYL CITRATE PF 50 MCG/ML IJ SOSY
50.0000 ug | PREFILLED_SYRINGE | INTRAMUSCULAR | Status: DC | PRN
  Administered 2022-05-25 – 2022-05-26 (×4): 100 ug via INTRAVENOUS
  Filled 2022-05-24 (×5): qty 2

## 2022-05-24 MED ORDER — POTASSIUM CHLORIDE 10 MEQ/100ML IV SOLN
10.0000 meq | INTRAVENOUS | Status: DC
  Administered 2022-05-24: 10 meq via INTRAVENOUS
  Filled 2022-05-24: qty 100

## 2022-05-24 MED ORDER — NALOXONE HCL 2 MG/2ML IJ SOSY
1.0000 mg | PREFILLED_SYRINGE | Freq: Once | INTRAMUSCULAR | Status: AC
  Administered 2022-05-24: 1 mg via INTRAVENOUS
  Filled 2022-05-24: qty 2

## 2022-05-24 MED ORDER — POTASSIUM CHLORIDE 10 MEQ/100ML IV SOLN
10.0000 meq | INTRAVENOUS | Status: AC
  Administered 2022-05-24 – 2022-05-25 (×3): 10 meq via INTRAVENOUS
  Filled 2022-05-24 (×3): qty 100

## 2022-05-24 MED ORDER — ONDANSETRON HCL 4 MG/2ML IJ SOLN
4.0000 mg | Freq: Once | INTRAMUSCULAR | Status: AC
  Administered 2022-05-24: 4 mg via INTRAVENOUS

## 2022-05-24 MED ORDER — NOREPINEPHRINE 4 MG/250ML-% IV SOLN
2.0000 ug/min | INTRAVENOUS | Status: DC
  Administered 2022-05-24: 2 ug/min via INTRAVENOUS
  Filled 2022-05-24: qty 250

## 2022-05-24 MED ORDER — IOHEXOL 300 MG/ML  SOLN
75.0000 mL | Freq: Once | INTRAMUSCULAR | Status: AC | PRN
  Administered 2022-05-24: 60 mL via INTRAVENOUS

## 2022-05-24 MED ORDER — PROPOFOL 1000 MG/100ML IV EMUL: 0.0000 ug/kg/min | INTRAVENOUS | Status: DC

## 2022-05-24 MED ORDER — FENTANYL CITRATE PF 50 MCG/ML IJ SOSY
50.0000 ug | PREFILLED_SYRINGE | INTRAMUSCULAR | Status: DC | PRN
  Administered 2022-05-30: 50 ug via INTRAVENOUS
  Filled 2022-05-24: qty 1

## 2022-05-24 MED ORDER — POTASSIUM CHLORIDE 20 MEQ PO PACK
40.0000 meq | PACK | Freq: Every day | ORAL | Status: DC
  Administered 2022-05-25 – 2022-05-29 (×5): 40 meq
  Filled 2022-05-24 (×5): qty 2

## 2022-05-24 MED ORDER — KCL IN DEXTROSE-NACL 20-5-0.45 MEQ/L-%-% IV SOLN
INTRAVENOUS | Status: DC
  Filled 2022-05-24: qty 1000

## 2022-05-24 MED ORDER — MIDAZOLAM HCL 2 MG/2ML IJ SOLN
2.0000 mg | INTRAMUSCULAR | Status: DC | PRN
  Administered 2022-05-25 – 2022-05-30 (×11): 2 mg via INTRAVENOUS
  Filled 2022-05-24 (×13): qty 2

## 2022-05-24 MED ORDER — ENOXAPARIN SODIUM 30 MG/0.3ML IJ SOSY
30.0000 mg | PREFILLED_SYRINGE | Freq: Two times a day (BID) | INTRAMUSCULAR | Status: DC
Start: 2022-05-25 — End: 2022-06-01
  Administered 2022-05-25 – 2022-06-01 (×14): 30 mg via SUBCUTANEOUS
  Filled 2022-05-24 (×14): qty 0.3

## 2022-05-24 MED ORDER — CALCIUM GLUCONATE-NACL 1-0.675 GM/50ML-% IV SOLN
1.0000 g | Freq: Once | INTRAVENOUS | Status: AC
  Administered 2022-05-24: 1000 mg via INTRAVENOUS
  Filled 2022-05-24: qty 50

## 2022-05-24 MED ORDER — PANTOPRAZOLE 2 MG/ML SUSPENSION
40.0000 mg | Freq: Every day | ORAL | Status: DC
  Administered 2022-05-25 – 2022-05-29 (×5): 40 mg
  Filled 2022-05-24 (×6): qty 20

## 2022-05-24 MED ORDER — ORAL CARE MOUTH RINSE: 15.0000 mL | OROMUCOSAL | Status: DC | PRN

## 2022-05-24 MED ORDER — CHLORHEXIDINE GLUCONATE CLOTH 2 % EX PADS
6.0000 | MEDICATED_PAD | Freq: Every day | CUTANEOUS | Status: DC
Start: 2022-05-24 — End: 2022-06-01
  Administered 2022-05-24 – 2022-06-01 (×9): 6 via TOPICAL

## 2022-05-24 MED ORDER — LACTATED RINGERS IV SOLN: INTRAVENOUS | Status: DC

## 2022-05-24 MED ORDER — IOHEXOL 350 MG/ML SOLN
50.0000 mL | Freq: Once | INTRAVENOUS | Status: AC | PRN
  Administered 2022-05-24: 50 mL via INTRAVENOUS

## 2022-05-24 MED ORDER — ONDANSETRON HCL 4 MG/2ML IJ SOLN: 4.0000 mg | Freq: Four times a day (QID) | INTRAMUSCULAR | Status: DC | PRN
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
## 2022-05-24 NOTE — ED Notes (Signed)
Returns to ED from CT, pt sleeping, sonorous resps, 100% SPO2 on RA.

## 2022-05-24 NOTE — ED Notes (Signed)
Etomidate 20 mg succ 140 iv per ryan rn

## 2022-05-24 NOTE — ED Notes (Addendum)
No significant response to repeat narcan. EDP at [facility] speaking with family.
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
## 2022-05-24 NOTE — ED Notes (Signed)
Back from CT, EDP at [facility].

## 2022-05-24 NOTE — ED Notes (Signed)
Out with [facility] 

## 2022-05-24 NOTE — ED Notes (Addendum)
100% on 2L, placed on NRB d/t mechanism/ presentation. Positive response to narcan.
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
## 2022-05-24 NOTE — ED Notes (Signed)
Pt remains with sonorous resps, SPO2 100% on RA, no subQ ephesema noted, no swelling in neck noted, no active bleeding. BP improved with 3rd liter LR. GCS 8, maintaining patent airway. MAEx4.

## 2022-05-24 NOTE — ED Notes (Signed)
To ct with trn 
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
## 2022-05-24 NOTE — ED Notes (Signed)
Unable to document the propofol drip

## 2022-05-24 NOTE — TOC CAGE-AID Note (Signed)
Transition of Care [facility]) - CAGE-AID Screening   Patient Details  Name: Philip [name] MRN: [id number] Date of Birth: 18-Sep-1991  Transition of Care [facility]) CM/SW Contact:    Leota Sauers, RN Phone Number: 05/24/2022, 10:40 PM   Clinical Narrative: Patient intubated at this time, pt unable to participate in assessment.   CAGE-AID Screening: Substance Abuse Screening unable to be completed due to: : Patient unable to participate (Patient intubated)

## 2022-05-24 NOTE — ED Notes (Signed)
16 french tewmp foley by ryan rn

## 2022-05-24 NOTE — ED Notes (Signed)
EDP at BS 

## 2022-05-24 NOTE — ED Notes (Signed)
Xray at BS 

## 2022-05-24 NOTE — ED Notes (Signed)
Narcan given 2 mg by auntmn rn

## 2022-05-24 NOTE — Progress Notes (Signed)
RT NOTE:  Pt transported to CT without event.  

## 2022-05-24 NOTE — ED Notes (Signed)
C-collar removed per Dr. Freida Busman
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
## 2022-05-24 NOTE — ED Notes (Signed)
Preoccupied with getting comfortable, being left alone, rolling over to side for comfort. Given warm blankets.  Follows some commands. Semi cooperative. Tolerated CXR. Verbalized no to pain/ denies pain. MAEx4, resps e/u, speaking clearly/ minimally.

## 2022-05-24 NOTE — ED Notes (Addendum)
EDP at [facility]. BP improved. Pt arousable to voice, follows some commands. Family at [facility]. Pt to CTA.
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
## 2022-05-24 NOTE — ED Notes (Signed)
Et tube   placed by dr Dalene Seltzer edp
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
## 2022-05-24 NOTE — ED Notes (Signed)
Getting ready for intubation

## 2022-05-25 ENCOUNTER — Inpatient Hospital Stay (HOSPITAL_COMMUNITY): Payer: Medicaid Other

## 2022-05-25 DIAGNOSIS — T50902A Poisoning by unspecified drugs, medicaments and biological substances, intentional self-harm, initial encounter: Secondary | ICD-10-CM

## 2022-05-25 DIAGNOSIS — S1191XA Laceration without foreign body of unspecified part of neck, initial encounter: Secondary | ICD-10-CM

## 2022-05-25 DIAGNOSIS — S1193XA Puncture wound without foreign body of unspecified part of neck, initial encounter: Secondary | ICD-10-CM

## 2022-05-25 DIAGNOSIS — E876 Hypokalemia: Secondary | ICD-10-CM

## 2022-05-25 DIAGNOSIS — J9601 Acute respiratory failure with hypoxia: Secondary | ICD-10-CM

## 2022-05-25 LAB — CBC
HCT: 32.8 % — ABNORMAL LOW (ref 39.0–52.0)
Hemoglobin: 11.1 g/dL — ABNORMAL LOW (ref 13.0–17.0)
MCH: 28.9 pg (ref 26.0–34.0)
MCHC: 33.8 g/dL (ref 30.0–36.0)
MCV: 85.4 fL (ref 80.0–100.0)
Platelets: 278 10*3/uL (ref 150–400)
RBC: 3.84 MIL/uL — ABNORMAL LOW (ref 4.22–5.81)
RDW: 12.4 % (ref 11.5–15.5)
WBC: 13.1 10*3/uL — ABNORMAL HIGH (ref 4.0–10.5)
nRBC: 0 % (ref 0.0–0.2)

## 2022-05-25 LAB — HEMOGLOBIN AND HEMATOCRIT, BLOOD
HCT: 31.4 % — ABNORMAL LOW (ref 39.0–52.0)
Hemoglobin: 10.7 g/dL — ABNORMAL LOW (ref 13.0–17.0)

## 2022-05-25 LAB — BASIC METABOLIC PANEL
Anion gap: 14 (ref 5–15)
BUN: 19 mg/dL (ref 6–20)
CO2: 21 mmol/L — ABNORMAL LOW (ref 22–32)
Calcium: 8.2 mg/dL — ABNORMAL LOW (ref 8.9–10.3)
Chloride: 102 mmol/L (ref 98–111)
Creatinine, Ser: 1.72 mg/dL — ABNORMAL HIGH (ref 0.61–1.24)
GFR, Estimated: 53 mL/min — ABNORMAL LOW (ref >60)
Glucose, Bld: 98 mg/dL (ref 70–99)
Potassium: 3.8 mmol/L (ref 3.5–5.1)
Sodium: 137 mmol/L (ref 135–145)

## 2022-05-25 LAB — COMPREHENSIVE METABOLIC PANEL
ALT: 31 U/L (ref 0–44)
AST: 55 U/L — ABNORMAL HIGH (ref 15–41)
Albumin: 3.3 g/dL — ABNORMAL LOW (ref 3.5–5.0)
Alkaline Phosphatase: 81 U/L (ref 38–126)
Anion gap: 12 (ref 5–15)
BUN: 22 mg/dL — ABNORMAL HIGH (ref 6–20)
CO2: 21 mmol/L — ABNORMAL LOW (ref 22–32)
Calcium: 8.3 mg/dL — ABNORMAL LOW (ref 8.9–10.3)
Chloride: 103 mmol/L (ref 98–111)
Creatinine, Ser: 2.03 mg/dL — ABNORMAL HIGH (ref 0.61–1.24)
GFR, Estimated: 44 mL/min — ABNORMAL LOW (ref >60)
Glucose, Bld: 108 mg/dL — ABNORMAL HIGH (ref 70–99)
Potassium: 3.8 mmol/L (ref 3.5–5.1)
Sodium: 136 mmol/L (ref 135–145)
Total Bilirubin: 1.2 mg/dL (ref 0.3–1.2)
Total Protein: 5.7 g/dL — ABNORMAL LOW (ref 6.5–8.1)

## 2022-05-25 LAB — TRIGLYCERIDES: Triglycerides: 106 mg/dL (ref <150)

## 2022-05-25 LAB — HIV ANTIBODY (ROUTINE TESTING W REFLEX): HIV Screen 4th Generation wRfx: NONREACTIVE

## 2022-05-25 LAB — POCT I-STAT 7, (LYTES, BLD GAS, ICA,H+H)
Acid-base deficit: 1 mmol/L (ref 0.0–2.0)
Bicarbonate: 22.9 mmol/L (ref 20.0–28.0)
Calcium, Ion: 1.14 mmol/L — ABNORMAL LOW (ref 1.15–1.40)
HCT: 31 % — ABNORMAL LOW (ref 39.0–52.0)
Hemoglobin: 10.5 g/dL — ABNORMAL LOW (ref 13.0–17.0)
O2 Saturation: 100 %
Patient temperature: 98
Potassium: 3.6 mmol/L (ref 3.5–5.1)
Sodium: 136 mmol/L (ref 135–145)
TCO2: 24 mmol/L (ref 22–32)
pCO2 arterial: 34.9 mmHg (ref 32–48)
pH, Arterial: 7.424 (ref 7.35–7.45)
pO2, Arterial: 183 mmHg — ABNORMAL HIGH (ref 83–108)

## 2022-05-25 LAB — LAMOTRIGINE LEVEL: Lamotrigine Lvl: 4.1 ug/mL (ref 2.0–20.0)

## 2022-05-25 LAB — LACTIC ACID, PLASMA: Lactic Acid, Venous: 2 mmol/L (ref 0.5–1.9)

## 2022-05-25 LAB — MAGNESIUM: Magnesium: 2.1 mg/dL (ref 1.7–2.4)

## 2022-05-25 MED ORDER — DEXMEDETOMIDINE HCL IN NACL 400 MCG/100ML IV SOLN
0.4000 ug/kg/h | INTRAVENOUS | Status: DC
  Administered 2022-05-25: 0.8 ug/kg/h via INTRAVENOUS
  Administered 2022-05-25: 0.4 ug/kg/h via INTRAVENOUS
  Administered 2022-05-26: 0.7 ug/kg/h via INTRAVENOUS
  Administered 2022-05-26: 0.4 ug/kg/h via INTRAVENOUS
  Administered 2022-05-27 (×2): 0.9 ug/kg/h via INTRAVENOUS
  Administered 2022-05-27: 0.6 ug/kg/h via INTRAVENOUS
  Administered 2022-05-28: 1 ug/kg/h via INTRAVENOUS
  Administered 2022-05-28: 0.9 ug/kg/h via INTRAVENOUS
  Administered 2022-05-28: 0.8 ug/kg/h via INTRAVENOUS
  Administered 2022-05-29 (×2): 1.8 ug/kg/h via INTRAVENOUS
  Administered 2022-05-29: 2 ug/kg/h via INTRAVENOUS
  Administered 2022-05-29: 1.8 ug/kg/h via INTRAVENOUS
  Administered 2022-05-29 (×2): 1.6 ug/kg/h via INTRAVENOUS
  Administered 2022-05-30: 2 ug/kg/h via INTRAVENOUS
  Administered 2022-05-30: 0.8 ug/kg/h via INTRAVENOUS
  Administered 2022-05-30 (×2): 1.9 ug/kg/h via INTRAVENOUS
  Filled 2022-05-25 (×2): qty 100
  Filled 2022-05-25: qty 200
  Filled 2022-05-25 (×3): qty 100
  Filled 2022-05-25: qty 200
  Filled 2022-05-25 (×14): qty 100

## 2022-05-25 MED ORDER — LACTATED RINGERS IV BOLUS
1000.0000 mL | Freq: Once | INTRAVENOUS | Status: AC
  Administered 2022-05-25: 1000 mL via INTRAVENOUS

## 2022-05-25 MED ORDER — CLONAZEPAM 0.5 MG PO TABS
0.5000 mg | ORAL_TABLET | Freq: Two times a day (BID) | ORAL | Status: DC
  Administered 2022-05-25 – 2022-05-27 (×5): 0.5 mg
  Filled 2022-05-25 (×5): qty 1

## 2022-05-25 MED ORDER — CHLORHEXIDINE GLUCONATE 0.12 % MT SOLN
15.0000 mL | Freq: Once | OROMUCOSAL | Status: AC
  Administered 2022-05-25: 15 mL via OROMUCOSAL

## 2022-05-25 MED ORDER — ORAL CARE MOUTH RINSE: 15.0000 mL | OROMUCOSAL | Status: DC | PRN

## 2022-05-25 MED ORDER — ORAL CARE MOUTH RINSE
15.0000 mL | OROMUCOSAL | Status: DC
  Administered 2022-05-25 – 2022-05-29 (×55): 15 mL via OROMUCOSAL

## 2022-05-25 MED ORDER — ALBUMIN HUMAN 5 % IV SOLN
25.0000 g | Freq: Once | INTRAVENOUS | Status: AC
  Administered 2022-05-25: 25 g via INTRAVENOUS
  Filled 2022-05-25: qty 500

## 2022-05-25 MED ORDER — POTASSIUM CHLORIDE 10 MEQ/100ML IV SOLN
10.0000 meq | INTRAVENOUS | Status: AC
  Administered 2022-05-25 (×2): 10 meq via INTRAVENOUS
  Filled 2022-05-25 (×2): qty 100

## 2022-05-25 NOTE — Progress Notes (Signed)
eLink Physician-Brief Progress Note Patient Name: Philip [name] DOB: June 01, 1991 MRN: [id number]   Date of Service  05/25/2022  HPI/Events of Note  Hgb = 11.1 --> 10.5. Already on Protonix.  eICU Interventions  Continue present management.     Intervention Category Major Interventions: Other:  Pierrette Scheu Dennard Nip 05/25/2022, 5:04 AM

## 2022-05-25 NOTE — Progress Notes (Signed)
Patient ID: Philip [name], male   DOB: [birth year], 31 y.o.   MRN: [id number]   Patient is still intubated.  I have discussed this case with Dr. Suszanne Conners who will take over from at this point while I leave town.  He agrees with this degree and level of injury that observing and then getting a barium swallow once the patient is awake would be appropriate.  I think keeping him n.p.o. but extubating as judged by the critical care team what would be appropriate at this point.  We will watch his neck closely.  If it starts becoming swollen or increasing issues with potential infection repeat CT scan will be performed and possible opening the neck.  Right now it still seems like for the patient sake not exploring his neck would be most appropriate.  Dr. Suszanne Conners  now make the medical decisions regarding this issue.
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
## 2022-05-25 NOTE — Progress Notes (Signed)
eLink Physician-Brief Progress Note Patient Name: Philip [name] DOB: July 26, 1991 MRN: [id number]   Date of Service  05/25/2022  HPI/Events of Note  Multiple issues: 1. Nursing concern with OGT output which has turned from green to red and has put out 700 mL. 2. Agitation - Nursing request for bilateral soft wrist, ankle and Posey belt restraint. Nursing request for additional sedation. Currently on Propofol IV infusion at 60 mcg/kg/min.+ Fentanyl and Versed IV PRN. Triglycerides = 106.  eICU Interventions  Plan: H/H STAT. Will order bilateral soft wrist, ankle and Posey belt restraints X 5 hours.  Titrate Propofol IV infusion to maximal dose.      Intervention Category Major Interventions: Delirium, psychosis, severe agitation - evaluation and management;Other:  Leva Baine Dennard Nip 05/25/2022, 4:15 AM

## 2022-05-25 NOTE — Progress Notes (Signed)
eLink Physician-Brief Progress Note Patient Name: Philip [name] DOB: 1991/10/16 MRN: [id number]   Date of Service  05/25/2022  HPI/Events of Note  Lactic Acid = 2.9.  eICU Interventions  Plan: LR 1 liter IV over 1 hour now.  Continue to trend Lactic Acid.      Intervention Category Major Interventions: Acid-Base disturbance - evaluation and management  Steed Kanaan Eugene 05/25/2022, 12:27 AM
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
## 2022-05-25 NOTE — Progress Notes (Signed)
eLink Physician-Brief Progress Note Patient Name: Philip [name] DOB: 1990-12-17 MRN: [id number]   Date of Service  05/25/2022  HPI/Events of Note  Hypokalemia - K+ = 3.8.  eICU Interventions  Plan: Will replace K+. Mg++ level STAT.     Intervention Category Major Interventions: Electrolyte abnormality - evaluation and management  Ryan Palermo Dennard Nip 05/25/2022, 3:39 AM

## 2022-05-26 DIAGNOSIS — S1193XA Puncture wound without foreign body of unspecified part of neck, initial encounter: Secondary | ICD-10-CM | POA: Diagnosis not present

## 2022-05-26 DIAGNOSIS — E876 Hypokalemia: Secondary | ICD-10-CM | POA: Diagnosis not present

## 2022-05-26 DIAGNOSIS — T50902A Poisoning by unspecified drugs, medicaments and biological substances, intentional self-harm, initial encounter: Secondary | ICD-10-CM | POA: Diagnosis not present

## 2022-05-26 DIAGNOSIS — J9601 Acute respiratory failure with hypoxia: Secondary | ICD-10-CM | POA: Diagnosis not present

## 2022-05-26 LAB — PHOSPHORUS: Phosphorus: 1.5 mg/dL — ABNORMAL LOW (ref 2.5–4.6)

## 2022-05-26 LAB — CBC
HCT: 29 % — ABNORMAL LOW (ref 39.0–52.0)
Hemoglobin: 9.7 g/dL — ABNORMAL LOW (ref 13.0–17.0)
MCH: 28.1 pg (ref 26.0–34.0)
MCHC: 33.4 g/dL (ref 30.0–36.0)
MCV: 84.1 fL (ref 80.0–100.0)
Platelets: 254 10*3/uL (ref 150–400)
RBC: 3.45 MIL/uL — ABNORMAL LOW (ref 4.22–5.81)
RDW: 12.1 % (ref 11.5–15.5)
WBC: 7.4 10*3/uL (ref 4.0–10.5)
nRBC: 0 % (ref 0.0–0.2)

## 2022-05-26 LAB — BASIC METABOLIC PANEL
Anion gap: 10 (ref 5–15)
BUN: 9 mg/dL (ref 6–20)
CO2: 23 mmol/L (ref 22–32)
Calcium: 8.7 mg/dL — ABNORMAL LOW (ref 8.9–10.3)
Chloride: 108 mmol/L (ref 98–111)
Creatinine, Ser: 1.23 mg/dL (ref 0.61–1.24)
GFR, Estimated: >60 mL/min (ref >60)
Glucose, Bld: 79 mg/dL (ref 70–99)
Potassium: 3.4 mmol/L — ABNORMAL LOW (ref 3.5–5.1)
Sodium: 141 mmol/L (ref 135–145)

## 2022-05-26 LAB — GLUCOSE, CAPILLARY
Glucose-Capillary: 86 mg/dL (ref 70–99)
Glucose-Capillary: 96 mg/dL (ref 70–99)
Glucose-Capillary: 90 mg/dL (ref 70–99)

## 2022-05-26 LAB — MAGNESIUM: Magnesium: 1.9 mg/dL (ref 1.7–2.4)

## 2022-05-26 MED ORDER — OSMOLITE 1.5 CAL PO LIQD
1000.0000 mL | ORAL | Status: DC
  Administered 2022-05-26 – 2022-05-29 (×3): 1000 mL

## 2022-05-26 MED ORDER — DOCUSATE SODIUM 50 MG/5ML PO LIQD
100.0000 mg | Freq: Two times a day (BID) | ORAL | Status: DC
  Administered 2022-05-26 – 2022-05-29 (×5): 100 mg
  Filled 2022-05-26 (×7): qty 10

## 2022-05-26 MED ORDER — POTASSIUM CHLORIDE 20 MEQ PO PACK
40.0000 meq | PACK | Freq: Once | ORAL | Status: AC
  Administered 2022-05-26: 40 meq
  Filled 2022-05-26: qty 2

## 2022-05-26 MED ORDER — PROSOURCE TF PO LIQD
45.0000 mL | Freq: Every day | ORAL | Status: DC
  Administered 2022-05-26 – 2022-05-29 (×4): 45 mL
  Filled 2022-05-26 (×4): qty 45

## 2022-05-26 MED ORDER — POTASSIUM PHOSPHATES 15 MMOLE/5ML IV SOLN
45.0000 mmol | Freq: Once | INTRAVENOUS | Status: AC
  Administered 2022-05-26: 45 mmol via INTRAVENOUS
  Filled 2022-05-26: qty 15

## 2022-05-26 MED ORDER — POLYETHYLENE GLYCOL 3350 17 G PO PACK
17.0000 g | PACK | Freq: Every day | ORAL | Status: DC
  Administered 2022-05-26 – 2022-05-29 (×4): 17 g
  Filled 2022-05-26 (×4): qty 1

## 2022-05-26 MED ORDER — ACETAMINOPHEN 500 MG PO TABS
1000.0000 mg | ORAL_TABLET | Freq: Four times a day (QID) | ORAL | Status: DC
  Administered 2022-05-26 – 2022-05-29 (×10): 1000 mg
  Filled 2022-05-26 (×10): qty 2

## 2022-05-26 MED ORDER — OXYCODONE HCL 5 MG/5ML PO SOLN
5.0000 mg | ORAL | Status: DC | PRN
  Administered 2022-05-27 – 2022-05-29 (×4): 10 mg
  Filled 2022-05-26 (×4): qty 10

## 2022-05-26 MED ORDER — METHOCARBAMOL 500 MG PO TABS
1000.0000 mg | ORAL_TABLET | Freq: Three times a day (TID) | ORAL | Status: DC
  Administered 2022-05-26 – 2022-05-29 (×8): 1000 mg
  Filled 2022-05-26 (×8): qty 2

## 2022-05-26 MED ORDER — FENTANYL 2500MCG IN NS 250ML (10MCG/ML) PREMIX INFUSION
0.0000 ug/h | INTRAVENOUS | Status: DC
  Administered 2022-05-26: 25 ug/h via INTRAVENOUS
  Administered 2022-05-27: 200 ug/h via INTRAVENOUS
  Administered 2022-05-27: 175 ug/h via INTRAVENOUS
  Administered 2022-05-28 (×2): 200 ug/h via INTRAVENOUS
  Filled 2022-05-26 (×5): qty 250

## 2022-05-26 NOTE — Plan of Care (Signed)
  Problem: Nutrition: Goal: Adequate nutrition will be maintained Outcome: Progressing   
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
## 2022-05-26 NOTE — Progress Notes (Signed)
[facility] ADULT ICU REPLACEMENT PROTOCOL   The patient does apply for the [facility] Adult ICU Electrolyte Replacment Protocol based on the criteria listed below:   1.Exclusion criteria: TCTS patients, ECMO patients, and Dialysis patients 2. Is GFR >/= 30 ml/min? Yes.    Patient's GFR today is >60 3. Is SCr </= 2? Yes.   Patient's SCr is 1.23 mg/dL 4. Did SCr increase >/= 0.5 in 24 hours? No. 5.Pt's weight >40kg  Yes.   6. Abnormal electrolyte(s): K+ 3.4  7. Electrolytes replaced per protocol 8.  Call MD STAT for K+ </= 2.5, Phos </= 1, or Mag </= 1 Physician:  n/a  Melvern Banker 05/26/2022 5:38 AM

## 2022-05-27 DIAGNOSIS — S1193XA Puncture wound without foreign body of unspecified part of neck, initial encounter: Secondary | ICD-10-CM | POA: Diagnosis not present

## 2022-05-27 LAB — CBC
HCT: 35.6 % — ABNORMAL LOW (ref 39.0–52.0)
Hemoglobin: 11.7 g/dL — ABNORMAL LOW (ref 13.0–17.0)
MCH: 28.5 pg (ref 26.0–34.0)
MCHC: 32.9 g/dL (ref 30.0–36.0)
MCV: 86.8 fL (ref 80.0–100.0)
Platelets: 231 10*3/uL (ref 150–400)
RBC: 4.1 MIL/uL — ABNORMAL LOW (ref 4.22–5.81)
RDW: 12.4 % (ref 11.5–15.5)
WBC: 5.7 10*3/uL (ref 4.0–10.5)
nRBC: 0 % (ref 0.0–0.2)

## 2022-05-27 LAB — POCT I-STAT 7, (LYTES, BLD GAS, ICA,H+H)
Acid-Base Excess: 2 mmol/L (ref 0.0–2.0)
Bicarbonate: 26.2 mmol/L (ref 20.0–28.0)
Calcium, Ion: 1.22 mmol/L (ref 1.15–1.40)
HCT: 30 % — ABNORMAL LOW (ref 39.0–52.0)
Hemoglobin: 10.2 g/dL — ABNORMAL LOW (ref 13.0–17.0)
O2 Saturation: 100 %
Patient temperature: 97.6
Potassium: 3.9 mmol/L (ref 3.5–5.1)
Sodium: 144 mmol/L (ref 135–145)
TCO2: 27 mmol/L (ref 22–32)
pCO2 arterial: 35.2 mmHg (ref 32–48)
pH, Arterial: 7.477 — ABNORMAL HIGH (ref 7.35–7.45)
pO2, Arterial: 178 mmHg — ABNORMAL HIGH (ref 83–108)

## 2022-05-27 LAB — GLUCOSE, CAPILLARY
Glucose-Capillary: 93 mg/dL (ref 70–99)
Glucose-Capillary: 90 mg/dL (ref 70–99)
Glucose-Capillary: 104 mg/dL — ABNORMAL HIGH (ref 70–99)
Glucose-Capillary: 150 mg/dL — ABNORMAL HIGH (ref 70–99)
Glucose-Capillary: 150 mg/dL — ABNORMAL HIGH (ref 70–99)

## 2022-05-27 LAB — BASIC METABOLIC PANEL
Anion gap: 9 (ref 5–15)
BUN: 11 mg/dL (ref 6–20)
CO2: 25 mmol/L (ref 22–32)
Calcium: 9.2 mg/dL (ref 8.9–10.3)
Chloride: 110 mmol/L (ref 98–111)
Creatinine, Ser: 1.11 mg/dL (ref 0.61–1.24)
GFR, Estimated: >60 mL/min (ref >60)
Glucose, Bld: 89 mg/dL (ref 70–99)
Potassium: 4.5 mmol/L (ref 3.5–5.1)
Sodium: 144 mmol/L (ref 135–145)

## 2022-05-27 LAB — MAGNESIUM: Magnesium: 2.2 mg/dL (ref 1.7–2.4)

## 2022-05-27 LAB — PHOSPHORUS: Phosphorus: 3.4 mg/dL (ref 2.5–4.6)

## 2022-05-27 MED ORDER — CLONAZEPAM 1 MG PO TABS
1.0000 mg | ORAL_TABLET | Freq: Three times a day (TID) | ORAL | Status: DC
  Administered 2022-05-27 – 2022-05-29 (×6): 1 mg
  Filled 2022-05-27 (×6): qty 1

## 2022-05-27 MED ORDER — MAGNESIUM SULFATE 4 GM/100ML IV SOLN
4.0000 g | Freq: Once | INTRAVENOUS | Status: DC
Start: 2022-05-27 — End: 2022-05-27
  Filled 2022-05-27: qty 100

## 2022-05-27 MED ORDER — MAGNESIUM SULFATE 4 GM/100ML IV SOLN
4.0000 g | Freq: Once | INTRAVENOUS | Status: AC
Start: 2022-05-27 — End: 2022-05-27
  Administered 2022-05-27: 4 g via INTRAVENOUS
  Filled 2022-05-27: qty 100

## 2022-05-27 NOTE — Progress Notes (Signed)
Follow up - Trauma and Critical Care  Patient Details:    Philip [name] is an 31 y.o. male.  Anti-infectives:  Anti-infectives (From admission, onward)    Start     Dose/Rate Route Frequency Ordered Stop   05/25/22 0000  Ampicillin-Sulbactam (UNASYN) 3 g in sodium chloride 0.9 % 100 mL IVPB        3 g 200 mL/hr over 30 Minutes Intravenous Every 6 hours 05/24/22 2303         Consults: Treatment Team:  Newman Pies, MD   Chief Complaint/Subjective:    Overnight Issues: Agitation issues overnight  Objective:  Vital signs for last 24 hours: Temp:  [97.6 F (36.4 C)-98.1 F (36.7 C)] 97.6 F (36.4 C) (07/29 0400) Pulse Rate:  [62-164] 83 (07/29 0800) Resp:  [15-22] 15 (07/29 0800) BP: (112-173)/(64-103) 160/95 (07/29 0800) SpO2:  [88 %-100 %] 100 % (07/29 0800) FiO2 (%):  [40 %] 40 % (07/29 0755) Weight:  [94.1 kg] 94.1 kg (07/29 0500)  Hemodynamic parameters for last 24 hours:    Intake/Output from previous day: 07/28 0701 - 07/29 0700 In: 4163.1 [I.V.:2707.7; NG/GT:736; IV Piggyback:719.4] Out: 1650 [Urine:1650]  Intake/Output this shift: Total I/O In: 1098.4 [I.V.:662.6; NG/GT:240; IV Piggyback:195.8] Out: -   Vent settings for last 24 hours: Vent Mode: PRVC FiO2 (%):  [40 %] 40 % Set Rate:  [15 bmp] 15 bmp Vt Set:  [081 mL] 620 mL PEEP:  [5 cmH20] 5 cmH20 Plateau Pressure:  [18 cmH20-19 cmH20] 19 cmH20  Physical Exam:  Gen: comfortable no distress HEENT: PERRL Resp: unlabored assisted breathing Cardiovascular: RRR Abdomen: soft, G tube in position Ext: trace edema Neuro: sedated   Assessment/Plan:   SI SW neck   CTA no vascular injuries Suspect aerodigestive tract injury - per Dr. Jearld Fenton, leave intubated for now, OGT output coffee ground so likely swallowed blood Multidrug overdose - CCM engaged. When Lamictal restarted must restart at 25mg  and increase 25mg  weekly to avoid SJ syndrome. Acute hypoxic ventilator dependent respiratory failure -  wean but do not extubate until 7/31 FEN - TF, precedex and klonopin for agitation AKI - resolved VTE - LMWH Dispo - ICU, vent   LOS: 3 days   Additional comments:I have discussed and reviewed with family members patient's weekend plan  Critical Care Total Time*: 35 minutes  Torria Fromer 05/27/2022  *Care during the described time interval was provided by me and/or other providers on the critical care team.  I have reviewed this patient's available data, including medical history, events of note, physical examination and test results as part of my evaluation.
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
## 2022-05-28 ENCOUNTER — Inpatient Hospital Stay (HOSPITAL_COMMUNITY): Payer: Medicaid Other

## 2022-05-28 DIAGNOSIS — S1193XA Puncture wound without foreign body of unspecified part of neck, initial encounter: Secondary | ICD-10-CM | POA: Diagnosis not present

## 2022-05-28 LAB — CBC
HCT: 32.3 % — ABNORMAL LOW (ref 39.0–52.0)
Hemoglobin: 10.7 g/dL — ABNORMAL LOW (ref 13.0–17.0)
MCH: 28.5 pg (ref 26.0–34.0)
MCHC: 33.1 g/dL (ref 30.0–36.0)
MCV: 86.1 fL (ref 80.0–100.0)
Platelets: 279 10*3/uL (ref 150–400)
RBC: 3.75 MIL/uL — ABNORMAL LOW (ref 4.22–5.81)
RDW: 12.3 % (ref 11.5–15.5)
WBC: 3.8 10*3/uL — ABNORMAL LOW (ref 4.0–10.5)
nRBC: 0 % (ref 0.0–0.2)

## 2022-05-28 LAB — BASIC METABOLIC PANEL
Anion gap: 5 (ref 5–15)
BUN: 8 mg/dL (ref 6–20)
CO2: 24 mmol/L (ref 22–32)
Calcium: 9 mg/dL (ref 8.9–10.3)
Chloride: 117 mmol/L — ABNORMAL HIGH (ref 98–111)
Creatinine, Ser: 0.93 mg/dL (ref 0.61–1.24)
GFR, Estimated: >60 mL/min (ref >60)
Glucose, Bld: 141 mg/dL — ABNORMAL HIGH (ref 70–99)
Potassium: 3.9 mmol/L (ref 3.5–5.1)
Sodium: 146 mmol/L — ABNORMAL HIGH (ref 135–145)

## 2022-05-28 LAB — MAGNESIUM: Magnesium: 2.2 mg/dL (ref 1.7–2.4)

## 2022-05-28 LAB — GLUCOSE, CAPILLARY
Glucose-Capillary: 100 mg/dL — ABNORMAL HIGH (ref 70–99)
Glucose-Capillary: 121 mg/dL — ABNORMAL HIGH (ref 70–99)
Glucose-Capillary: 125 mg/dL — ABNORMAL HIGH (ref 70–99)
Glucose-Capillary: 135 mg/dL — ABNORMAL HIGH (ref 70–99)
Glucose-Capillary: 146 mg/dL — ABNORMAL HIGH (ref 70–99)
Glucose-Capillary: 87 mg/dL (ref 70–99)

## 2022-05-28 LAB — PHOSPHORUS: Phosphorus: 3.7 mg/dL (ref 2.5–4.6)

## 2022-05-28 LAB — TRIGLYCERIDES: Triglycerides: 202 mg/dL — ABNORMAL HIGH (ref <150)

## 2022-05-28 MED ORDER — SODIUM CHLORIDE 0.9 % IV SOLN
2.0000 g | INTRAVENOUS | Status: AC
  Administered 2022-05-28 – 2022-05-29 (×2): 2 g via INTRAVENOUS
  Filled 2022-05-28 (×2): qty 20

## 2022-05-28 MED ORDER — BISACODYL 10 MG RE SUPP
10.0000 mg | Freq: Every day | RECTAL | Status: DC | PRN
  Administered 2022-05-28: 10 mg via RECTAL
  Filled 2022-05-28: qty 1

## 2022-05-28 MED ORDER — LAMOTRIGINE 25 MG PO TABS
25.0000 mg | ORAL_TABLET | Freq: Every day | ORAL | Status: DC
  Administered 2022-05-28 – 2022-05-29 (×2): 25 mg
  Filled 2022-05-28 (×2): qty 1

## 2022-05-28 MED ORDER — OLANZAPINE 5 MG PO TBDP
5.0000 mg | ORAL_TABLET | Freq: Every day | ORAL | Status: DC
Start: 2022-05-28 — End: 2022-05-28

## 2022-05-28 MED ORDER — LISINOPRIL 10 MG PO TABS
10.0000 mg | ORAL_TABLET | Freq: Every day | ORAL | Status: DC
Start: 2022-05-28 — End: 2022-05-29
  Administered 2022-05-28 – 2022-05-29 (×2): 10 mg
  Filled 2022-05-28 (×2): qty 1

## 2022-05-28 MED ORDER — VALPROIC ACID 250 MG/5ML PO SOLN
250.0000 mg | Freq: Two times a day (BID) | ORAL | Status: DC
Start: 2022-05-28 — End: 2022-05-29
  Administered 2022-05-28 – 2022-05-29 (×2): 250 mg
  Filled 2022-05-28 (×2): qty 5

## 2022-05-28 NOTE — Progress Notes (Signed)
Trauma Event Note   TRN at bedside to round, precedex paused overnight yesterday and reinitiated during the day shift. Pt remains on fentanyl and propofol and well sedated at the time of my rounds. Checked in with primary RN who has no needs for TRN at this time.    Last imported Vital Signs BP (!) 140/106 (BP Location: Right Arm)   Pulse 63   Temp 97.7 F (36.5 C) (Axillary)   Resp 15   Ht 6' (1.829 m)   Wt 207 lb 7.3 oz (94.1 kg)   SpO2 100%   BMI 28.14 kg/m   Trending CBC Recent Labs    05/25/22 0107 05/25/22 0427 05/26/22 0406 05/27/22 0508 05/27/22 0826  WBC 13.1*  --  7.4  --  5.7  HGB 11.1*   < > 9.7* 10.2* 11.7*  HCT 32.8*   < > 29.0* 30.0* 35.6*  PLT 278  --  254  --  231   < > = values in this interval not displayed.    Trending Coag's No results for input(s): "APTT", "INR" in the last 72 hours.  Trending BMET Recent Labs    05/25/22 0256 05/25/22 0427 05/26/22 0406 05/27/22 0508 05/27/22 0826  NA 137   < > 141 144 144  K 3.8   < > 3.4* 3.9 4.5  CL 102  --  108  --  110  CO2 21*  --  23  --  25  BUN 19  --  9  --  11  CREATININE 1.72*  --  1.23  --  1.11  GLUCOSE 98  --  79  --  89   < > = values in this interval not displayed.      Caitlin Ainley O Larissa Pegg  Trauma Response RN  Please call TRN at [phone] for further assistance.
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
## 2022-05-29 LAB — PHOSPHORUS: Phosphorus: 6 mg/dL — ABNORMAL HIGH (ref 2.5–4.6)

## 2022-05-29 LAB — GLUCOSE, CAPILLARY
Glucose-Capillary: 101 mg/dL — ABNORMAL HIGH (ref 70–99)
Glucose-Capillary: 112 mg/dL — ABNORMAL HIGH (ref 70–99)
Glucose-Capillary: 116 mg/dL — ABNORMAL HIGH (ref 70–99)
Glucose-Capillary: 118 mg/dL — ABNORMAL HIGH (ref 70–99)
Glucose-Capillary: 122 mg/dL — ABNORMAL HIGH (ref 70–99)

## 2022-05-29 LAB — BASIC METABOLIC PANEL
Anion gap: 5 (ref 5–15)
BUN: 10 mg/dL (ref 6–20)
CO2: 26 mmol/L (ref 22–32)
Calcium: 8.7 mg/dL — ABNORMAL LOW (ref 8.9–10.3)
Chloride: 116 mmol/L — ABNORMAL HIGH (ref 98–111)
Creatinine, Ser: 1.03 mg/dL (ref 0.61–1.24)
GFR, Estimated: >60 mL/min (ref >60)
Glucose, Bld: 106 mg/dL — ABNORMAL HIGH (ref 70–99)
Potassium: 4.4 mmol/L (ref 3.5–5.1)
Sodium: 147 mmol/L — ABNORMAL HIGH (ref 135–145)

## 2022-05-29 LAB — CBC
HCT: 34 % — ABNORMAL LOW (ref 39.0–52.0)
Hemoglobin: 10.7 g/dL — ABNORMAL LOW (ref 13.0–17.0)
MCH: 28.5 pg (ref 26.0–34.0)
MCHC: 31.5 g/dL (ref 30.0–36.0)
MCV: 90.7 fL (ref 80.0–100.0)
Platelets: 305 10*3/uL (ref 150–400)
RBC: 3.75 MIL/uL — ABNORMAL LOW (ref 4.22–5.81)
RDW: 12.5 % (ref 11.5–15.5)
WBC: 6.3 10*3/uL (ref 4.0–10.5)
nRBC: 0 % (ref 0.0–0.2)

## 2022-05-29 LAB — MAGNESIUM: Magnesium: 1.9 mg/dL (ref 1.7–2.4)

## 2022-05-29 MED ORDER — CLONAZEPAM 0.5 MG PO TABS
1.0000 mg | ORAL_TABLET | Freq: Three times a day (TID) | ORAL | Status: DC
  Administered 2022-05-30 – 2022-06-01 (×6): 1 mg via ORAL
  Filled 2022-05-29 (×2): qty 1
  Filled 2022-05-29 (×3): qty 2
  Filled 2022-05-29: qty 1

## 2022-05-29 MED ORDER — DESMOPRESSIN ACETATE 0.1 MG PO TABS
0.2000 mg | ORAL_TABLET | Freq: Every day | ORAL | Status: DC
  Filled 2022-05-29 (×3): qty 2

## 2022-05-29 MED ORDER — ACETAMINOPHEN 10 MG/ML IV SOLN
1000.0000 mg | Freq: Four times a day (QID) | INTRAVENOUS | Status: AC
  Administered 2022-05-29 – 2022-05-30 (×3): 1000 mg via INTRAVENOUS
  Filled 2022-05-29 (×4): qty 100

## 2022-05-29 MED ORDER — OXYCODONE HCL 5 MG/5ML PO SOLN: 5.0000 mg | ORAL | Status: DC | PRN

## 2022-05-29 MED ORDER — VALPROIC ACID 250 MG/5ML PO SOLN: 250.0000 mg | Freq: Two times a day (BID) | ORAL | Status: DC

## 2022-05-29 MED ORDER — METOPROLOL TARTRATE 5 MG/5ML IV SOLN
5.0000 mg | Freq: Four times a day (QID) | INTRAVENOUS | Status: DC | PRN
  Administered 2022-05-29 – 2022-05-31 (×4): 5 mg via INTRAVENOUS
  Filled 2022-05-29 (×5): qty 5

## 2022-05-29 MED ORDER — LISINOPRIL 10 MG PO TABS
10.0000 mg | ORAL_TABLET | Freq: Every day | ORAL | Status: DC
Start: 2022-05-30 — End: 2022-05-31
  Administered 2022-05-30: 10 mg via ORAL
  Filled 2022-05-29: qty 1

## 2022-05-29 MED ORDER — PANTOPRAZOLE 2 MG/ML SUSPENSION: 40.0000 mg | Freq: Every day | ORAL | Status: DC

## 2022-05-29 MED ORDER — METHOCARBAMOL 500 MG PO TABS: 1000.0000 mg | ORAL_TABLET | Freq: Three times a day (TID) | ORAL | Status: DC

## 2022-05-29 MED ORDER — ACETAMINOPHEN 500 MG PO TABS: 1000.0000 mg | ORAL_TABLET | Freq: Four times a day (QID) | ORAL | Status: DC

## 2022-05-29 MED ORDER — ORAL CARE MOUTH RINSE
15.0000 mL | OROMUCOSAL | Status: DC
  Administered 2022-05-30 – 2022-06-01 (×7): 15 mL via OROMUCOSAL

## 2022-05-29 MED ORDER — POTASSIUM CHLORIDE 20 MEQ PO PACK: 40.0000 meq | PACK | Freq: Every day | ORAL | Status: DC

## 2022-05-29 MED ORDER — LAMOTRIGINE 25 MG PO TABS
25.0000 mg | ORAL_TABLET | Freq: Every day | ORAL | Status: DC
  Administered 2022-05-30 – 2022-06-01 (×3): 25 mg via ORAL
  Filled 2022-05-29 (×4): qty 1

## 2022-05-29 MED ORDER — ORAL CARE MOUTH RINSE
15.0000 mL | OROMUCOSAL | Status: DC | PRN
Start: 2022-05-29 — End: 2022-06-01

## 2022-05-29 MED ORDER — MAGNESIUM SULFATE 2 GM/50ML IV SOLN
2.0000 g | Freq: Once | INTRAVENOUS | Status: AC
  Administered 2022-05-29: 2 g via INTRAVENOUS
  Filled 2022-05-29: qty 50
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
## 2022-05-29 NOTE — Procedures (Signed)
Extubation Procedure Note  Patient Details:   Name: Philip [name] DOB: [birth year] MRN: [id number]   Airway Documentation:    Vent end date: 05/29/22 Vent end time: 1155   Evaluation  O2 sats: stable throughout Complications: No apparent complications Patient did tolerate procedure well. Bilateral Breath Sounds: Clear, Diminished   Yes  Pt extubated to 3L [name], pt tolerating well at this time. Positive cuff leak, no stridor noted, RN at bedside,RT will monitor  [name] 05/29/2022, 12:02 PM
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
## 2022-05-30 ENCOUNTER — Inpatient Hospital Stay (HOSPITAL_COMMUNITY): Payer: Medicaid Other

## 2022-05-30 LAB — BASIC METABOLIC PANEL
Anion gap: 7 (ref 5–15)
BUN: 12 mg/dL (ref 6–20)
CO2: 23 mmol/L (ref 22–32)
Calcium: 9.4 mg/dL (ref 8.9–10.3)
Chloride: 110 mmol/L (ref 98–111)
Creatinine, Ser: 0.96 mg/dL (ref 0.61–1.24)
GFR, Estimated: >60 mL/min (ref >60)
Glucose, Bld: 101 mg/dL — ABNORMAL HIGH (ref 70–99)
Potassium: 4.2 mmol/L (ref 3.5–5.1)
Sodium: 140 mmol/L (ref 135–145)

## 2022-05-30 LAB — CBC
HCT: 33.2 % — ABNORMAL LOW (ref 39.0–52.0)
Hemoglobin: 11 g/dL — ABNORMAL LOW (ref 13.0–17.0)
MCH: 28.6 pg (ref 26.0–34.0)
MCHC: 33.1 g/dL (ref 30.0–36.0)
MCV: 86.5 fL (ref 80.0–100.0)
Platelets: 322 10*3/uL (ref 150–400)
RBC: 3.84 MIL/uL — ABNORMAL LOW (ref 4.22–5.81)
RDW: 12.2 % (ref 11.5–15.5)
WBC: 8.8 10*3/uL (ref 4.0–10.5)
nRBC: 0 % (ref 0.0–0.2)

## 2022-05-30 LAB — PHOSPHORUS: Phosphorus: 3.9 mg/dL (ref 2.5–4.6)

## 2022-05-30 LAB — GLUCOSE, CAPILLARY
Glucose-Capillary: 119 mg/dL — ABNORMAL HIGH (ref 70–99)
Glucose-Capillary: 113 mg/dL — ABNORMAL HIGH (ref 70–99)
Glucose-Capillary: 106 mg/dL — ABNORMAL HIGH (ref 70–99)

## 2022-05-30 LAB — MAGNESIUM: Magnesium: 1.9 mg/dL (ref 1.7–2.4)

## 2022-05-30 MED ORDER — HYDRALAZINE HCL 20 MG/ML IJ SOLN
10.0000 mg | Freq: Four times a day (QID) | INTRAMUSCULAR | Status: DC | PRN
  Administered 2022-05-30 – 2022-05-31 (×3): 10 mg via INTRAVENOUS
  Filled 2022-05-30 (×3): qty 1

## 2022-05-30 MED ORDER — DIVALPROEX SODIUM 250 MG PO DR TAB
250.0000 mg | DELAYED_RELEASE_TABLET | Freq: Three times a day (TID) | ORAL | Status: DC
  Administered 2022-05-30 – 2022-06-01 (×3): 250 mg via ORAL
  Filled 2022-05-30 (×8): qty 1

## 2022-05-30 MED ORDER — METOPROLOL TARTRATE 5 MG/5ML IV SOLN
5.0000 mg | Freq: Once | INTRAVENOUS | Status: AC
  Administered 2022-05-30: 5 mg via INTRAVENOUS
  Filled 2022-05-30: qty 5

## 2022-05-30 MED ORDER — MORPHINE SULFATE (PF) 2 MG/ML IV SOLN: 2.0000 mg | INTRAVENOUS | Status: DC | PRN

## 2022-05-30 MED ORDER — DEXTROSE 5 % IV SOLN
250.0000 mg | Freq: Three times a day (TID) | INTRAVENOUS | Status: DC
  Administered 2022-05-30: 250 mg via INTRAVENOUS
  Filled 2022-05-30 (×4): qty 2.5

## 2022-05-30 MED ORDER — BENZTROPINE MESYLATE 0.5 MG PO TABS: 1.0000 mg | ORAL_TABLET | Freq: Four times a day (QID) | ORAL | Status: DC | PRN

## 2022-05-30 MED ORDER — CLONIDINE HCL 0.2 MG/24HR TD PTWK
0.2000 mg | MEDICATED_PATCH | TRANSDERMAL | Status: DC
  Administered 2022-05-30: 0.2 mg via TRANSDERMAL
  Filled 2022-05-30: qty 1

## 2022-05-30 MED ORDER — VALPROIC ACID 250 MG/5ML PO SOLN
250.0000 mg | Freq: Three times a day (TID) | ORAL | Status: DC
Start: 2022-05-30 — End: 2022-05-30

## 2022-05-30 MED ORDER — VALPROATE SODIUM 100 MG/ML IV SOLN
250.0000 mg | Freq: Three times a day (TID) | INTRAVENOUS | Status: AC
  Filled 2022-05-30: qty 2.5

## 2022-05-30 MED ORDER — DESMOPRESSIN ACETATE 0.1 MG PO TABS
0.2000 mg | ORAL_TABLET | Freq: Every day | ORAL | Status: DC
Start: 2022-05-31 — End: 2022-06-01
  Administered 2022-05-30 – 2022-05-31 (×2): 0.2 mg via ORAL
  Filled 2022-05-30 (×3): qty 2

## 2022-05-30 MED ORDER — HALOPERIDOL 5 MG PO TABS: 5.0000 mg | ORAL_TABLET | Freq: Four times a day (QID) | ORAL | Status: DC | PRN

## 2022-05-30 MED ORDER — BENZTROPINE MESYLATE 1 MG/ML IJ SOLN
1.0000 mg | Freq: Four times a day (QID) | INTRAMUSCULAR | Status: DC | PRN
Start: 2022-05-30 — End: 2022-06-01

## 2022-05-30 MED ORDER — HALOPERIDOL LACTATE 5 MG/ML IJ SOLN
5.0000 mg | Freq: Four times a day (QID) | INTRAMUSCULAR | Status: DC | PRN
Start: 2022-05-30 — End: 2022-06-01

## 2022-05-30 MED ORDER — METHOCARBAMOL 1000 MG/10ML IJ SOLN
1000.0000 mg | Freq: Three times a day (TID) | INTRAVENOUS | Status: DC
  Filled 2022-05-30: qty 10

## 2022-05-30 MED ORDER — VALPROATE SODIUM 100 MG/ML IV SOLN: 250.0000 mg | Freq: Two times a day (BID) | INTRAVENOUS | Status: DC

## 2022-05-30 MED ORDER — POTASSIUM CHLORIDE CRYS ER 20 MEQ PO TBCR
40.0000 meq | EXTENDED_RELEASE_TABLET | Freq: Every day | ORAL | Status: DC
  Administered 2022-05-30: 40 meq via ORAL
  Filled 2022-05-30: qty 2

## 2022-05-30 MED ORDER — PANTOPRAZOLE SODIUM 40 MG PO TBEC
40.0000 mg | DELAYED_RELEASE_TABLET | Freq: Every day | ORAL | Status: DC
Start: 2022-05-30 — End: 2022-05-31
  Administered 2022-05-30: 40 mg via ORAL
  Filled 2022-05-30: qty 1
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
## 2022-05-31 DIAGNOSIS — T1491XA Suicide attempt, initial encounter: Secondary | ICD-10-CM

## 2022-05-31 MED ORDER — ACETAMINOPHEN 500 MG PO TABS: 1000.0000 mg | ORAL_TABLET | Freq: Four times a day (QID) | ORAL | Status: DC | PRN

## 2022-05-31 MED ORDER — METOPROLOL TARTRATE 50 MG PO TABS
50.0000 mg | ORAL_TABLET | Freq: Two times a day (BID) | ORAL | Status: DC
  Administered 2022-05-31 (×2): 50 mg via ORAL
  Filled 2022-05-31 (×2): qty 1

## 2022-05-31 MED ORDER — LISINOPRIL 20 MG PO TABS
20.0000 mg | ORAL_TABLET | Freq: Every day | ORAL | Status: DC
  Administered 2022-05-31 – 2022-06-01 (×2): 20 mg via ORAL
  Filled 2022-05-31 (×2): qty 1

## 2022-05-31 MED ORDER — AMLODIPINE BESYLATE 10 MG PO TABS: 10.0000 mg | ORAL_TABLET | Freq: Every day | ORAL | Status: DC

## 2022-05-31 MED ORDER — ENSURE ENLIVE PO LIQD
237.0000 mL | Freq: Two times a day (BID) | ORAL | Status: DC
  Administered 2022-06-01: 237 mL via ORAL

## 2022-05-31 NOTE — TOC Progression Note (Addendum)
Transition of Care [facility]) - Progression Note    Patient Details  Name: Philip [name] MRN: [id number] Date of Birth: 30-May-1991  Transition of Care [facility]) CM/SW Contact  Astrid Drafts Berna Spare, RN Phone Number: 05/31/2022, 3:43 PM  Clinical Narrative:    Patient has been declined for admission to [facility].  Patient referral faxed out to the following inpatient psychiatric facilities:  [facility]  [facility]  [facility] Case Manager will provide updates as available.   Addendum: [id number]  Patient has been accepted for admission to [facility], to be admitted voluntarily on Thursday, August 3 after 8am.   Accepting physician is Dr. Landry Mellow Bedside nurse to call report to [phone].  Covid testing has been requested, as required by facility.    TOC will arrange transport in AM to facility.   Patient is agreeable to receiving treatment at [facility]; I have updated his sister, Charisse March with transfer information.    Expected Discharge Plan: Psychiatric Hospital Barriers to Discharge: Continued Medical Work up  Expected Discharge Plan and Services Expected Discharge Plan: Psychiatric Hospital   Discharge Planning Services: CM Consult   Living arrangements for the past 2 months: Single Family Home                                       Social Determinants of Health (SDOH) Interventions    Readmission Risk Interventions     No data to display         Quintella Baton, RN, BSN  Trauma/Neuro ICU Case Manager [phone]
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
## 2022-06-01 LAB — CBC
HCT: 35.9 % — ABNORMAL LOW (ref 39.0–52.0)
Hemoglobin: 11.9 g/dL — ABNORMAL LOW (ref 13.0–17.0)
MCH: 28.7 pg (ref 26.0–34.0)
MCHC: 33.1 g/dL (ref 30.0–36.0)
MCV: 86.7 fL (ref 80.0–100.0)
Platelets: 358 10*3/uL (ref 150–400)
RBC: 4.14 MIL/uL — ABNORMAL LOW (ref 4.22–5.81)
RDW: 12.3 % (ref 11.5–15.5)
WBC: 9 10*3/uL (ref 4.0–10.5)
nRBC: 0 % (ref 0.0–0.2)

## 2022-06-01 LAB — BASIC METABOLIC PANEL
Anion gap: 8 (ref 5–15)
BUN: 14 mg/dL (ref 6–20)
CO2: 24 mmol/L (ref 22–32)
Calcium: 9.4 mg/dL (ref 8.9–10.3)
Chloride: 107 mmol/L (ref 98–111)
Creatinine, Ser: 1.02 mg/dL (ref 0.61–1.24)
GFR, Estimated: >60 mL/min (ref >60)
Glucose, Bld: 117 mg/dL — ABNORMAL HIGH (ref 70–99)
Potassium: 3.8 mmol/L (ref 3.5–5.1)
Sodium: 139 mmol/L (ref 135–145)

## 2022-06-01 LAB — MAGNESIUM: Magnesium: 2.1 mg/dL (ref 1.7–2.4)

## 2022-06-01 LAB — SARS CORONAVIRUS 2 BY RT PCR: SARS Coronavirus 2 by RT PCR: NEGATIVE

## 2022-06-01 LAB — PHOSPHORUS: Phosphorus: 3.2 mg/dL (ref 2.5–4.6)

## 2022-06-01 MED ORDER — LISINOPRIL 20 MG PO TABS: 20.0000 mg | ORAL_TABLET | Freq: Every day | ORAL | Status: AC

## 2022-06-01 MED ORDER — METOPROLOL TARTRATE 100 MG PO TABS
100.0000 mg | ORAL_TABLET | Freq: Two times a day (BID) | ORAL | Status: DC
  Administered 2022-06-01: 100 mg via ORAL
  Filled 2022-06-01: qty 1

## 2022-06-01 MED ORDER — METOPROLOL TARTRATE 100 MG PO TABS: 100.0000 mg | ORAL_TABLET | Freq: Two times a day (BID) | ORAL | Status: AC

## 2022-06-01 MED ORDER — ACETAMINOPHEN 500 MG PO TABS
1000.0000 mg | ORAL_TABLET | Freq: Four times a day (QID) | ORAL | 0 refills | Status: DC | PRN
Start: 2022-06-01 — End: 2024-02-12

## 2022-06-01 MED ORDER — HALOPERIDOL 5 MG PO TABS: 5.0000 mg | ORAL_TABLET | Freq: Four times a day (QID) | ORAL | Status: DC | PRN

## 2022-06-01 MED ORDER — CLONAZEPAM 1 MG PO TABS: 1.0000 mg | ORAL_TABLET | Freq: Three times a day (TID) | ORAL | 0 refills | Status: DC

## 2022-06-01 MED ORDER — BENZTROPINE MESYLATE 1 MG PO TABS: 1.0000 mg | ORAL_TABLET | Freq: Four times a day (QID) | ORAL | Status: DC | PRN
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
## 2022-06-01 NOTE — TOC Transition Note (Addendum)
Transition of Care [facility]) - CM/SW Discharge Note   Patient Details  Name: Philip [name] MRN: [id number] Date of Birth: Feb 20, 1991  Transition of Care [facility]) CM/SW Contact:  Glennon Mac, RN Phone Number: 06/01/2022, 9:56 AM   Clinical Narrative:    Patient medically stable for discharge and has been accepted for admission to [facility], to be admitted voluntarily today.  Safe Transport notified for transport to facility; arranged with Raynelle Fanning at [place]. Will notify bedside nurse when transport en route.  Accepting physician is Dr. Landry Mellow Bedside nurse to call report to [phone].    Final next level of care: Psychiatric Hospital Barriers to Discharge: Barriers Resolved            Discharge Plan and Services   Discharge Planning Services: CM Consult                                 Social Determinants of Health (SDOH) Interventions     Readmission Risk Interventions     No data to display         Quintella Baton, RN, BSN  Trauma/Neuro ICU Case Manager [phone]

## 2022-10-06 ENCOUNTER — Other Ambulatory Visit: Payer: Self-pay

## 2022-10-06 MED ORDER — ACETAMINOPHEN-CODEINE 300-30 MG PO TABS
1.0000 | ORAL_TABLET | Freq: Four times a day (QID) | ORAL | 0 refills | Status: DC
  Filled 2022-10-06: qty 12, 3d supply, fill #0

## 2022-10-06 MED ORDER — AMOXICILLIN 500 MG PO CAPS
500.0000 mg | ORAL_CAPSULE | Freq: Three times a day (TID) | ORAL | 0 refills | Status: AC
  Filled 2022-10-06: qty 21, 7d supply, fill #0

## 2022-11-27 ENCOUNTER — Other Ambulatory Visit: Payer: Self-pay

## 2022-11-27 MED ORDER — SODIUM FLUORIDE 1.1 % DT CREA
TOPICAL_CREAM | DENTAL | 2 refills | Status: DC
  Filled 2022-11-27: qty 51, 30d supply, fill #0

## 2024-02-11 ENCOUNTER — Inpatient Hospital Stay (HOSPITAL_COMMUNITY)
Admission: EM | Admit: 2024-02-11 | Discharge: 2024-02-18 | DRG: 637 | Disposition: A | Discharge status: Home / Self Care | Payer: MEDICAID | Attending: Internal Medicine | Admitting: Internal Medicine

## 2024-02-11 ENCOUNTER — Emergency Department (HOSPITAL_COMMUNITY): Payer: MEDICAID

## 2024-02-11 DIAGNOSIS — Z8249 Family history of ischemic heart disease and other diseases of the circulatory system: Secondary | ICD-10-CM | POA: Diagnosis not present

## 2024-02-11 DIAGNOSIS — R339 Retention of urine, unspecified: Secondary | ICD-10-CM | POA: Diagnosis not present

## 2024-02-11 DIAGNOSIS — J45909 Unspecified asthma, uncomplicated: Secondary | ICD-10-CM | POA: Diagnosis present

## 2024-02-11 DIAGNOSIS — G9341 Metabolic encephalopathy: Secondary | ICD-10-CM | POA: Diagnosis present

## 2024-02-11 DIAGNOSIS — R1110 Vomiting, unspecified: Secondary | ICD-10-CM | POA: Diagnosis not present

## 2024-02-11 DIAGNOSIS — I10 Essential (primary) hypertension: Secondary | ICD-10-CM | POA: Diagnosis present

## 2024-02-11 DIAGNOSIS — F319 Bipolar disorder, unspecified: Secondary | ICD-10-CM | POA: Diagnosis present

## 2024-02-11 DIAGNOSIS — R34 Anuria and oliguria: Secondary | ICD-10-CM | POA: Diagnosis not present

## 2024-02-11 DIAGNOSIS — N179 Acute kidney failure, unspecified: Secondary | ICD-10-CM | POA: Diagnosis present

## 2024-02-11 DIAGNOSIS — E876 Hypokalemia: Secondary | ICD-10-CM | POA: Diagnosis present

## 2024-02-11 DIAGNOSIS — Z91014 Allergy to mammalian meats: Secondary | ICD-10-CM | POA: Diagnosis not present

## 2024-02-11 DIAGNOSIS — T383X6A Underdosing of insulin and oral hypoglycemic [antidiabetic] drugs, initial encounter: Secondary | ICD-10-CM | POA: Diagnosis present

## 2024-02-11 DIAGNOSIS — Z91148 Patient's other noncompliance with medication regimen for other reason: Secondary | ICD-10-CM

## 2024-02-11 DIAGNOSIS — Z781 Physical restraint status: Secondary | ICD-10-CM

## 2024-02-11 DIAGNOSIS — D649 Anemia, unspecified: Secondary | ICD-10-CM | POA: Diagnosis not present

## 2024-02-11 DIAGNOSIS — J690 Pneumonitis due to inhalation of food and vomit: Secondary | ICD-10-CM | POA: Diagnosis not present

## 2024-02-11 DIAGNOSIS — Z79891 Long term (current) use of opiate analgesic: Secondary | ICD-10-CM | POA: Diagnosis not present

## 2024-02-11 DIAGNOSIS — Z7984 Long term (current) use of oral hypoglycemic drugs: Secondary | ICD-10-CM | POA: Diagnosis not present

## 2024-02-11 DIAGNOSIS — E1110 Type 2 diabetes mellitus with ketoacidosis without coma: Principal | ICD-10-CM | POA: Diagnosis present

## 2024-02-11 DIAGNOSIS — F1290 Cannabis use, unspecified, uncomplicated: Secondary | ICD-10-CM | POA: Diagnosis present

## 2024-02-11 DIAGNOSIS — T50916A Underdosing of multiple unspecified drugs, medicaments and biological substances, initial encounter: Secondary | ICD-10-CM | POA: Diagnosis present

## 2024-02-11 DIAGNOSIS — F209 Schizophrenia, unspecified: Secondary | ICD-10-CM | POA: Diagnosis present

## 2024-02-11 DIAGNOSIS — Z91128 Patient's intentional underdosing of medication regimen for other reason: Secondary | ICD-10-CM

## 2024-02-11 DIAGNOSIS — Z91013 Allergy to seafood: Secondary | ICD-10-CM

## 2024-02-11 DIAGNOSIS — Z79899 Other long term (current) drug therapy: Secondary | ICD-10-CM

## 2024-02-11 DIAGNOSIS — F17210 Nicotine dependence, cigarettes, uncomplicated: Secondary | ICD-10-CM | POA: Diagnosis present

## 2024-02-11 DIAGNOSIS — Z87892 Personal history of anaphylaxis: Secondary | ICD-10-CM

## 2024-02-11 LAB — CBC WITH DIFFERENTIAL/PLATELET
Abs Immature Granulocytes: 0.14 10*3/uL — ABNORMAL HIGH (ref 0.00–0.07)
Basophils Absolute: 0 10*3/uL (ref 0.0–0.1)
Basophils Relative: 0 %
Eosinophils Absolute: 0 10*3/uL (ref 0.0–0.5)
Eosinophils Relative: 0 %
HCT: 42 % (ref 39.0–52.0)
Hemoglobin: 13.7 g/dL (ref 13.0–17.0)
Immature Granulocytes: 1 %
Lymphocytes Relative: 5 %
Lymphs Abs: 0.9 10*3/uL (ref 0.7–4.0)
MCH: 26.9 pg (ref 26.0–34.0)
MCHC: 32.6 g/dL (ref 30.0–36.0)
MCV: 82.5 fL (ref 80.0–100.0)
Monocytes Absolute: 1.5 10*3/uL — ABNORMAL HIGH (ref 0.1–1.0)
Monocytes Relative: 9 %
Neutro Abs: 14.1 10*3/uL — ABNORMAL HIGH (ref 1.7–7.7)
Neutrophils Relative %: 85 %
Platelets: 413 10*3/uL — ABNORMAL HIGH (ref 150–400)
RBC: 5.09 MIL/uL (ref 4.22–5.81)
RDW: 12.4 % (ref 11.5–15.5)
WBC: 16.6 10*3/uL — ABNORMAL HIGH (ref 4.0–10.5)
nRBC: 0 % (ref 0.0–0.2)

## 2024-02-11 LAB — RAPID URINE DRUG SCREEN, HOSP PERFORMED
Amphetamines: NOT DETECTED
Barbiturates: NOT DETECTED
Benzodiazepines: NOT DETECTED
Cocaine: NOT DETECTED
Opiates: NOT DETECTED
Tetrahydrocannabinol: POSITIVE — AB

## 2024-02-11 LAB — COMPREHENSIVE METABOLIC PANEL WITH GFR
ALT: 22 U/L (ref 0–44)
AST: 21 U/L (ref 15–41)
Albumin: 4.7 g/dL (ref 3.5–5.0)
Alkaline Phosphatase: 113 U/L (ref 38–126)
BUN: 72 mg/dL — ABNORMAL HIGH (ref 6–20)
CO2: <7 mmol/L — ABNORMAL LOW (ref 22–32)
Calcium: 9.9 mg/dL (ref 8.9–10.3)
Chloride: 82 mmol/L — ABNORMAL LOW (ref 98–111)
Creatinine, Ser: 3.28 mg/dL — ABNORMAL HIGH (ref 0.61–1.24)
GFR, Estimated: 24 mL/min — ABNORMAL LOW (ref >60)
Glucose, Bld: 1128 mg/dL (ref 70–99)
Potassium: 4.9 mmol/L (ref 3.5–5.1)
Sodium: 125 mmol/L — ABNORMAL LOW (ref 135–145)
Total Bilirubin: 1.9 mg/dL — ABNORMAL HIGH (ref 0.0–1.2)
Total Protein: 8.5 g/dL — ABNORMAL HIGH (ref 6.5–8.1)

## 2024-02-11 LAB — CBG MONITORING, ED
Glucose-Capillary: >600 mg/dL (ref 70–99)
Glucose-Capillary: >600 mg/dL (ref 70–99)

## 2024-02-11 LAB — URINALYSIS, W/ REFLEX TO CULTURE (INFECTION SUSPECTED)
Bacteria, UA: NONE SEEN
Bilirubin Urine: NEGATIVE
Glucose, UA: >=500 mg/dL — AB
Ketones, ur: 80 mg/dL — AB
Leukocytes,Ua: NEGATIVE
Nitrite: NEGATIVE
Protein, ur: 100 mg/dL — AB
Specific Gravity, Urine: 1.021 (ref 1.005–1.030)
pH: 5 (ref 5.0–8.0)

## 2024-02-11 LAB — ETHANOL: Alcohol, Ethyl (B): <10 mg/dL (ref <10)

## 2024-02-11 LAB — BETA-HYDROXYBUTYRIC ACID: Beta-Hydroxybutyric Acid: >8 mmol/L — ABNORMAL HIGH (ref 0.05–0.27)

## 2024-02-11 LAB — LACTIC ACID, PLASMA: Lactic Acid, Venous: 4.6 mmol/L (ref 0.5–1.9)

## 2024-02-11 MED ORDER — POLYETHYLENE GLYCOL 3350 17 G PO PACK: 17.0000 g | PACK | Freq: Every day | ORAL | Status: DC | PRN

## 2024-02-11 MED ORDER — DEXTROSE 50 % IV SOLN: 0.0000 mL | INTRAVENOUS | Status: DC | PRN

## 2024-02-11 MED ORDER — ONDANSETRON HCL 4 MG/2ML IJ SOLN
4.0000 mg | Freq: Four times a day (QID) | INTRAMUSCULAR | Status: DC | PRN
  Administered 2024-02-15 – 2024-02-16 (×2): 4 mg via INTRAVENOUS
  Filled 2024-02-11 (×2): qty 2

## 2024-02-11 MED ORDER — LACTATED RINGERS IV BOLUS
1000.0000 mL | Freq: Once | INTRAVENOUS | Status: AC
  Administered 2024-02-11: 1000 mL via INTRAVENOUS

## 2024-02-11 MED ORDER — LACTATED RINGERS IV SOLN: INTRAVENOUS | Status: AC

## 2024-02-11 MED ORDER — DOCUSATE SODIUM 100 MG PO CAPS
100.0000 mg | ORAL_CAPSULE | Freq: Two times a day (BID) | ORAL | Status: DC | PRN
  Administered 2024-02-15: 100 mg via ORAL
  Filled 2024-02-11: qty 1

## 2024-02-11 MED ORDER — INSULIN REGULAR(HUMAN) IN NACL 100-0.9 UT/100ML-% IV SOLN
INTRAVENOUS | Status: DC
  Filled 2024-02-11: qty 100

## 2024-02-11 MED ORDER — DEXTROSE IN LACTATED RINGERS 5 % IV SOLN: INTRAVENOUS | Status: AC

## 2024-02-11 MED ORDER — CALCIUM GLUCONATE 10 % IV SOLN
1.0000 g | Freq: Once | INTRAVENOUS | Status: AC
  Administered 2024-02-11: 1 g via INTRAVENOUS
  Filled 2024-02-11: qty 10

## 2024-02-11 MED ORDER — INSULIN REGULAR(HUMAN) IN NACL 100-0.9 UT/100ML-% IV SOLN
INTRAVENOUS | Status: DC
Start: 2024-02-11 — End: 2024-02-14
  Administered 2024-02-11: 11.5 [IU]/h via INTRAVENOUS
  Administered 2024-02-12: 6.5 [IU]/h via INTRAVENOUS
  Administered 2024-02-12: 13 [IU]/h via INTRAVENOUS
  Administered 2024-02-13: 9 [IU]/h via INTRAVENOUS
  Administered 2024-02-14: 11.5 [IU]/h via INTRAVENOUS
  Filled 2024-02-11 (×4): qty 100

## 2024-02-11 NOTE — ED Notes (Signed)
 Attempted to gain additional access x2 attempts unsuccessfully. IV Team consult placed.

## 2024-02-11 NOTE — ED Triage Notes (Signed)
 Pt BIBA after family report that Pt has not taking his HTN,DM, and Bi-Polar meds x1 wk. Family states that he has been in a manic state and LKW @1930  on yesterday.  138-112/82-98%RA 20g R-AC NWG:NFAO([id number]) 300cc NaCl

## 2024-02-11 NOTE — H&P (Signed)
 NAME:  KATHRYN LINAREZ, MRN:  [id number], DOB:  [birth year], LOS: 0 ADMISSION DATE:  02/11/2024, CONSULTATION DATE:  02/11/24 REFERRING MD:  EDP, CHIEF COMPLAINT:  DKA   History of Present Illness:  33 yo male presented with ams. Pt was last seen normal at 1930 02/10/24. His family states that he has been in a manic state for some time.   Pertinent  Medical History  HTN DM  Significant Hospital Events: Including procedures, antibiotic start and stop dates in addition to other pertinent events   Admitted to ICU 4/14  Interim History / Subjective:    Objective   Blood pressure 124/84, pulse (!) 134, resp. rate (!) 31, SpO2 99%.       No intake or output data in the 24 hours ending 02/11/24 2244 There were no vitals filed for this visit.  Examination: General: *** HENT: *** Lungs: *** Cardiovascular: *** Abdomen: *** Extremities: *** Neuro: *** GU: ***  Resolved Hospital Problem list     Assessment & Plan:  ***  Best Practice (right click and "Reselect all SmartList Selections" daily)   Diet/type: {diet type:25684} DVT prophylaxis {anticoagulation:25687} Pressure ulcer(s): {pressure ulcer(s):31683} GI prophylaxis: {WU:98119} Lines: {Central Venous Access:25771} Foley:  {Central Venous Access:25691} Code Status:  {Code Status:26939} Last date of multidisciplinary goals of care discussion [***]  Labs   CBC: Recent Labs  Lab 02/11/24 2139  WBC 16.6*  NEUTROABS 14.1*  HGB 13.7  HCT 42.0  MCV 82.5  PLT 413*    Basic Metabolic Panel: No results for input(s): "NA", "K", "CL", "CO2", "GLUCOSE", "BUN", "CREATININE", "CALCIUM", "MG", "PHOS" in the last 168 hours. GFR: CrCl cannot be calculated (Patient's most recent lab result is older than the maximum 21 days allowed.). Recent Labs  Lab 02/11/24 2139  WBC 16.6*    Liver Function Tests: No results for input(s): "AST", "ALT", "ALKPHOS", "BILITOT", "PROT", "ALBUMIN" in the last 168 hours. No results for  input(s): "LIPASE", "AMYLASE" in the last 168 hours. No results for input(s): "AMMONIA" in the last 168 hours.  ABG    Component Value Date/Time   PHART 7.477 (H) 05/27/2022 0508   PCO2ART 35.2 05/27/2022 0508   PO2ART 178 (H) 05/27/2022 0508   HCO3 26.2 05/27/2022 0508   TCO2 27 05/27/2022 0508   ACIDBASEDEF 1.0 05/25/2022 0427   O2SAT 100 05/27/2022 0508     Coagulation Profile: No results for input(s): "INR", "PROTIME" in the last 168 hours.  Cardiac Enzymes: No results for input(s): "CKTOTAL", "CKMB", "CKMBINDEX", "TROPONINI" in the last 168 hours.  HbA1C: Hgb A1c MFr Bld  Date/Time Value Ref Range Status  09/03/2014 06:24 AM 5.5 <5.7 % Final    Comment:    (NOTE)                                                                       According to the ADA Clinical Practice Recommendations for 2011, when HbA1c is used as a screening test:  >=6.5%   Diagnostic of Diabetes Mellitus           (if abnormal result is confirmed) 5.7-6.4%   Increased risk of developing Diabetes Mellitus References:Diagnosis and Classification of Diabetes Mellitus,Diabetes Care,2011,34(Suppl 1):S62-S69 and Standards of Medical Care in  Diabetes - 2011,Diabetes Care,2011,34 (Suppl 1):S11-S61.     CBG: Recent Labs  Lab 02/11/24 2121 02/11/24 2239  GLUCAP >600* >600*    Review of Systems:   As per HPI  Past Medical History:  He,  has a past medical history of ADHD (attention deficit hyperactivity disorder), Asthma, Bipolar affective disorder (HCC), and Schizophrenia (HCC).   Surgical History:   Past Surgical History:  Procedure Laterality Date   NO PAST SURGERIES       Social History:   reports that he has been smoking cigarettes. He has a 6 pack-year smoking history. He has never used smokeless tobacco. He reports current alcohol use of about 1.0 standard drink of alcohol per week. He reports current drug use. Drug: Marijuana.   Family History:  His family history  includes Hypertension in his mother.   Allergies Allergies  Allergen Reactions   Pork-Derived Products Other (See Comments)    Does not eat it   Shrimp [Shellfish Allergy] Anaphylaxis     Home Medications  Prior to Admission medications   Medication Sig Start Date End Date Taking? Authorizing Provider  acetaminophen (TYLENOL) 500 MG tablet Take 2 tablets (1,000 mg total) by mouth every 6 (six) hours as needed for moderate pain, mild pain or fever. 06/01/22   Meuth, Brooke A, PA-C  acetaminophen-codeine (TYLENOL #3) 300-30 MG tablet Take 1 tablet by mouth every 6 (six) hours. 10/05/22     benztropine (COGENTIN) 1 MG tablet Take 1 tablet (1 mg total) by mouth every 6 (six) hours as needed for tremors (agitation). 06/01/22   Meuth, Brooke A, PA-C  clonazePAM (KLONOPIN) 1 MG tablet Take 1 tablet (1 mg total) by mouth 3 (three) times daily. 06/01/22   Meuth, Brooke A, PA-C  haloperidol (HALDOL) 5 MG tablet Take 1 tablet (5 mg total) by mouth every 6 (six) hours as needed for agitation. 06/01/22   Meuth, Brooke A, PA-C  hydrOXYzine (ATARAX/VISTARIL) 50 MG tablet Take 1 tablet (50 mg total) by mouth 3 (three) times daily as needed for anxiety. 12/24/18   Suzi Essex, MD  lamoTRIgine (LAMICTAL) 150 MG tablet Take 150 mg by mouth daily.    [provider]  lisinopril (ZESTRIL) 20 MG tablet Take 1 tablet (20 mg total) by mouth daily. 06/01/22   Meuth, Brooke A, PA-C  metoprolol tartrate (LOPRESSOR) 100 MG tablet Take 1 tablet (100 mg total) by mouth 2 (two) times daily. 06/01/22   Meuth, Brooke A, PA-C  OLANZapine (ZYPREXA) 15 MG tablet Take 15 mg by mouth at bedtime.    [provider]  rosuvastatin (CRESTOR) 20 MG tablet Take 20 mg by mouth.    [provider]  sodium fluoride (PREVIDENT 5000 PLUS) 1.1 % CREA dental cream Use instead of current toothpaste. Brush twice a day and spit out. Do not rinse, eat, or drink after using. 11/22/22        Critical care time: ***

## 2024-02-11 NOTE — ED Provider Notes (Signed)
 [place] EMERGENCY DEPARTMENT AT [facility] Provider Note   CSN: [id number] Arrival date & time: 02/11/24  2113     History  Chief Complaint  Patient presents with   Hyperglycemia    Philip [name] is a 33 y.o. male.  HPI 33 year old male presents with altered mental status.  EMS reports the patient has been altered all day today and that he has diabetes and bipolar but does not take his meds.  Last known well was yesterday.  He is having coo small breathing and his glucose was high.  Further history is obtainable due to mental status.  Home Medications Prior to Admission medications   Medication Sig Start Date End Date Taking? Authorizing Provider  acetaminophen (TYLENOL) 500 MG tablet Take 2 tablets (1,000 mg total) by mouth every 6 (six) hours as needed for moderate pain, mild pain or fever. 06/01/22   Meuth, Brooke A, PA-C  acetaminophen-codeine (TYLENOL #3) 300-30 MG tablet Take 1 tablet by mouth every 6 (six) hours. 10/05/22     benztropine (COGENTIN) 1 MG tablet Take 1 tablet (1 mg total) by mouth every 6 (six) hours as needed for tremors (agitation). 06/01/22   Meuth, Brooke A, PA-C  clonazePAM (KLONOPIN) 1 MG tablet Take 1 tablet (1 mg total) by mouth 3 (three) times daily. 06/01/22   Meuth, Brooke A, PA-C  haloperidol (HALDOL) 5 MG tablet Take 1 tablet (5 mg total) by mouth every 6 (six) hours as needed for agitation. 06/01/22   Meuth, Brooke A, PA-C  hydrOXYzine (ATARAX/VISTARIL) 50 MG tablet Take 1 tablet (50 mg total) by mouth 3 (three) times daily as needed for anxiety. 12/24/18   Malvin Johns, MD  lamoTRIgine (LAMICTAL) 150 MG tablet Take 150 mg by mouth daily.    [provider]  lisinopril (ZESTRIL) 20 MG tablet Take 1 tablet (20 mg total) by mouth daily. 06/01/22   Meuth, Brooke A, PA-C  metoprolol tartrate (LOPRESSOR) 100 MG tablet Take 1 tablet (100 mg total) by mouth 2 (two) times daily. 06/01/22   Meuth, Brooke A, PA-C  OLANZapine (ZYPREXA) 15 MG tablet  Take 15 mg by mouth at bedtime.    [provider]  rosuvastatin (CRESTOR) 20 MG tablet Take 20 mg by mouth.    [provider]  sodium fluoride (PREVIDENT 5000 PLUS) 1.1 % CREA dental cream Use instead of current toothpaste. Brush twice a day and spit out. Do not rinse, eat, or drink after using. 11/22/22         Allergies    Pork-derived products and Shrimp [shellfish allergy]    Review of Systems   Review of Systems  Unable to perform ROS: Mental status change    Physical Exam Updated Vital Signs BP 124/84 (BP Location: Left Arm)   Pulse (!) 134   Resp (!) 31   SpO2 99%  Physical Exam Vitals and nursing note reviewed.  Constitutional:      Appearance: He is well-developed. He is ill-appearing. He is not diaphoretic.  HENT:     Head: Normocephalic and atraumatic.  Cardiovascular:     Rate and Rhythm: Regular rhythm. Tachycardia present.     Heart sounds: Normal heart sounds.  Pulmonary:     Breath sounds: Normal breath sounds.     Comments: Increased/kussmaul breathing Abdominal:     General: There is no distension.  Skin:    General: Skin is warm and dry.  Neurological:     Mental Status: He is lethargic.  Comments: Patient is lethargic, moves to pain but doesn't follow commands.     ED Results / Procedures / Treatments   Labs (all labs ordered are listed, but only abnormal results are displayed) Labs Reviewed  COMPREHENSIVE METABOLIC PANEL WITH GFR - Abnormal; Notable for the following components:      Result Value   Sodium 125 (*)    Chloride 82 (*)    CO2 <7 (*)    Glucose, Bld 1,128 (*)    BUN 72 (*)    Creatinine, Ser 3.28 (*)    Total Protein 8.5 (*)    Total Bilirubin 1.9 (*)    GFR, Estimated 24 (*)    All other components within normal limits  CBC WITH DIFFERENTIAL/PLATELET - Abnormal; Notable for the following components:   WBC 16.6 (*)    Platelets 413 (*)    Neutro Abs 14.1 (*)    Monocytes Absolute 1.5 (*)    Abs  Immature Granulocytes 0.14 (*)    All other components within normal limits  LACTIC ACID, PLASMA - Abnormal; Notable for the following components:   Lactic Acid, Venous 4.6 (*)    All other components within normal limits  CBG MONITORING, ED - Abnormal; Notable for the following components:   Glucose-Capillary >600 (*)    All other components within normal limits  CBG MONITORING, ED - Abnormal; Notable for the following components:   Glucose-Capillary >600 (*)    All other components within normal limits  ETHANOL  URINALYSIS, W/ REFLEX TO CULTURE (INFECTION SUSPECTED)  BLOOD GAS, VENOUS  RAPID URINE DRUG SCREEN, HOSP PERFORMED  BETA-HYDROXYBUTYRIC ACID  OSMOLALITY  LACTIC ACID, PLASMA  BASIC METABOLIC PANEL WITH GFR  BASIC METABOLIC PANEL WITH GFR  BASIC METABOLIC PANEL WITH GFR  BETA-HYDROXYBUTYRIC ACID  BETA-HYDROXYBUTYRIC ACID  BETA-HYDROXYBUTYRIC ACID  I-STAT CHEM 8, ED    EKG EKG Interpretation Date/Time:  Monday February 11 2024 21:17:13 EDT Ventricular Rate:  141 PR Interval:  115 QRS Duration:  104 QT Interval:  311 QTC Calculation: 477 R Axis:   103  Text Interpretation: Sinus tachycardia Large T waves diffusely when compared to Aug 2023 Confirmed by Jerilynn Montenegro [phone]) on 02/11/2024 9:53:25 PM  Radiology DG Chest Portable 1 View Result Date: 02/11/2024 CLINICAL DATA:  Altered mental status, hyperglycemia EXAM: PORTABLE CHEST 1 VIEW COMPARISON:  None Available. FINDINGS: The heart size and mediastinal contours are within normal limits. Both lungs are clear. The visualized skeletal structures are unremarkable. IMPRESSION: No active disease. Electronically Signed   By: Worthy Heads M.D.   On: 02/11/2024 22:19    Procedures .Critical Care  Performed by: Jerilynn Montenegro, MD Authorized by: Jerilynn Montenegro, MD   Critical care provider statement:    Critical care time (minutes):  45   Critical care time was exclusive of:  Separately billable procedures and  treating other patients   Critical care was necessary to treat or prevent imminent or life-threatening deterioration of the following conditions:  CNS failure or compromise and endocrine crisis   Critical care was time spent personally by me on the following activities:  Development of treatment plan with patient or surrogate, discussions with consultants, evaluation of patient's response to treatment, examination of patient, ordering and review of laboratory studies, ordering and review of radiographic studies, ordering and performing treatments and interventions, pulse oximetry, re-evaluation of patient's condition and review of old charts .Ultrasound ED Peripheral IV (Provider)  Date/Time: 02/11/2024 11:15 PM  Performed by: Jerilynn Montenegro, MD Authorized by:  Jerilynn Montenegro, MD   Procedure details:    Indications: multiple failed IV attempts and poor IV access     Skin Prep: chlorhexidine gluconate     Location:  Left AC   Angiocath:  18 G   Bedside Ultrasound Guided: Yes     Patient tolerated procedure without complications: Yes     Dressing applied: Yes       Medications Ordered in ED Medications  lactated ringers infusion (has no administration in time range)  dextrose 5 % in lactated ringers infusion (has no administration in time range)  dextrose 50 % solution 0-50 mL (has no administration in time range)  insulin regular, human (MYXREDLIN) 100 units/ 100 mL infusion (has no administration in time range)  lactated ringers bolus 1,000 mL (1,000 mLs Intravenous Bolus 02/11/24 2146)  lactated ringers bolus 1,000 mL (1,000 mLs Intravenous Bolus 02/11/24 2146)  calcium gluconate inj 10% (1 g) URGENT USE ONLY! (1 g Intravenous Given 02/11/24 2235)    ED Course/ Medical Decision Making/ A&P                                 Medical Decision Making Amount and/or Complexity of Data Reviewed Labs: ordered.    Details: Severe DKA with a bicarb less than 7 and a glucose over  thousand Radiology: ordered and independent interpretation performed.    Details: No pneumonia ECG/medicine tests: ordered and independent interpretation performed.    Details: Peaked T waves  Risk Prescription drug management. Decision regarding hospitalization.   Patient presents altered.  He appears to be in severe DKA.  Labs are slowly coming back but he has a significant anion gap acidosis with a bicarb less than 7 and a glucose over thousand with an AKI.  He was given IV fluids and started on IV insulin.  Also given calcium as his ECG is concerning for hyperkalemia.  He is starting to become a little more awake though is still quite altered.  Discussed with Dr. Charon Copper of critical care who will admit and also asked for head CT.  No obvious trauma when discussing with sister at the bedside.  Patient is critically ill and will need the ICU for DKA management.        Final Clinical Impression(s) / ED Diagnoses Final diagnoses:  Diabetic ketoacidosis without coma associated with type 2 diabetes mellitus [facility])    Rx / DC Orders ED Discharge Orders     None         Jerilynn Montenegro, MD 02/11/24 2319

## 2024-02-12 DIAGNOSIS — N179 Acute kidney failure, unspecified: Secondary | ICD-10-CM | POA: Diagnosis not present

## 2024-02-12 DIAGNOSIS — G9341 Metabolic encephalopathy: Secondary | ICD-10-CM | POA: Diagnosis not present

## 2024-02-12 DIAGNOSIS — E1110 Type 2 diabetes mellitus with ketoacidosis without coma: Secondary | ICD-10-CM | POA: Diagnosis not present

## 2024-02-12 LAB — BASIC METABOLIC PANEL WITH GFR
Anion gap: 26 — ABNORMAL HIGH (ref 5–15)
Anion gap: 25 — ABNORMAL HIGH (ref 5–15)
Anion gap: 23 — ABNORMAL HIGH (ref 5–15)
Anion gap: 18 — ABNORMAL HIGH (ref 5–15)
BUN: 69 mg/dL — ABNORMAL HIGH (ref 6–20)
BUN: 67 mg/dL — ABNORMAL HIGH (ref 6–20)
BUN: 68 mg/dL — ABNORMAL HIGH (ref 6–20)
BUN: 67 mg/dL — ABNORMAL HIGH (ref 6–20)
CO2: 11 mmol/L — ABNORMAL LOW (ref 22–32)
CO2: 12 mmol/L — ABNORMAL LOW (ref 22–32)
CO2: 15 mmol/L — ABNORMAL LOW (ref 22–32)
CO2: 16 mmol/L — ABNORMAL LOW (ref 22–32)
Calcium: 10.9 mg/dL — ABNORMAL HIGH (ref 8.9–10.3)
Calcium: 10.8 mg/dL — ABNORMAL HIGH (ref 8.9–10.3)
Calcium: 10.6 mg/dL — ABNORMAL HIGH (ref 8.9–10.3)
Calcium: 10.2 mg/dL (ref 8.9–10.3)
Chloride: 102 mmol/L (ref 98–111)
Chloride: 104 mmol/L (ref 98–111)
Chloride: 104 mmol/L (ref 98–111)
Chloride: 107 mmol/L (ref 98–111)
Creatinine, Ser: 2.52 mg/dL — ABNORMAL HIGH (ref 0.61–1.24)
Creatinine, Ser: 2.55 mg/dL — ABNORMAL HIGH (ref 0.61–1.24)
Creatinine, Ser: 2.45 mg/dL — ABNORMAL HIGH (ref 0.61–1.24)
Creatinine, Ser: 2.45 mg/dL — ABNORMAL HIGH (ref 0.61–1.24)
GFR, Estimated: 33 mL/min — ABNORMAL LOW (ref >60)
GFR, Estimated: 33 mL/min — ABNORMAL LOW (ref >60)
GFR, Estimated: 35 mL/min — ABNORMAL LOW (ref >60)
GFR, Estimated: 35 mL/min — ABNORMAL LOW (ref >60)
Glucose, Bld: 286 mg/dL — ABNORMAL HIGH (ref 70–99)
Glucose, Bld: 246 mg/dL — ABNORMAL HIGH (ref 70–99)
Glucose, Bld: 224 mg/dL — ABNORMAL HIGH (ref 70–99)
Glucose, Bld: 236 mg/dL — ABNORMAL HIGH (ref 70–99)
Potassium: 3.4 mmol/L — ABNORMAL LOW (ref 3.5–5.1)
Potassium: 3.6 mmol/L (ref 3.5–5.1)
Potassium: 3.4 mmol/L — ABNORMAL LOW (ref 3.5–5.1)
Potassium: 3.1 mmol/L — ABNORMAL LOW (ref 3.5–5.1)
Sodium: 139 mmol/L (ref 135–145)
Sodium: 141 mmol/L (ref 135–145)
Sodium: 142 mmol/L (ref 135–145)
Sodium: 141 mmol/L (ref 135–145)

## 2024-02-12 LAB — GLUCOSE, CAPILLARY
Glucose-Capillary: 191 mg/dL — ABNORMAL HIGH (ref 70–99)
Glucose-Capillary: 201 mg/dL — ABNORMAL HIGH (ref 70–99)
Glucose-Capillary: 208 mg/dL — ABNORMAL HIGH (ref 70–99)
Glucose-Capillary: 216 mg/dL — ABNORMAL HIGH (ref 70–99)
Glucose-Capillary: 217 mg/dL — ABNORMAL HIGH (ref 70–99)
Glucose-Capillary: 217 mg/dL — ABNORMAL HIGH (ref 70–99)
Glucose-Capillary: 221 mg/dL — ABNORMAL HIGH (ref 70–99)
Glucose-Capillary: 223 mg/dL — ABNORMAL HIGH (ref 70–99)
Glucose-Capillary: 226 mg/dL — ABNORMAL HIGH (ref 70–99)
Glucose-Capillary: 227 mg/dL — ABNORMAL HIGH (ref 70–99)
Glucose-Capillary: 227 mg/dL — ABNORMAL HIGH (ref 70–99)
Glucose-Capillary: 234 mg/dL — ABNORMAL HIGH (ref 70–99)
Glucose-Capillary: 241 mg/dL — ABNORMAL HIGH (ref 70–99)
Glucose-Capillary: 247 mg/dL — ABNORMAL HIGH (ref 70–99)
Glucose-Capillary: 247 mg/dL — ABNORMAL HIGH (ref 70–99)
Glucose-Capillary: 299 mg/dL — ABNORMAL HIGH (ref 70–99)
Glucose-Capillary: 358 mg/dL — ABNORMAL HIGH (ref 70–99)
Glucose-Capillary: 398 mg/dL — ABNORMAL HIGH (ref 70–99)
Glucose-Capillary: 513 mg/dL (ref 70–99)
Glucose-Capillary: 562 mg/dL (ref 70–99)
Glucose-Capillary: 590 mg/dL (ref 70–99)
Glucose-Capillary: >600 mg/dL (ref 70–99)
Glucose-Capillary: >600 mg/dL (ref 70–99)
Glucose-Capillary: >600 mg/dL (ref 70–99)
Glucose-Capillary: >600 mg/dL (ref 70–99)
Glucose-Capillary: >600 mg/dL (ref 70–99)

## 2024-02-12 LAB — BLOOD GAS, VENOUS
Acid-base deficit: 23.1 mmol/L — ABNORMAL HIGH (ref 0.0–2.0)
Acid-base deficit: 4 mmol/L — ABNORMAL HIGH (ref 0.0–2.0)
Bicarbonate: 3.9 mmol/L — ABNORMAL LOW (ref 20.0–28.0)
Bicarbonate: 19.8 mmol/L — ABNORMAL LOW (ref 20.0–28.0)
Drawn by: 7855
O2 Saturation: 88.3 %
O2 Saturation: 96 %
Patient temperature: 37
Patient temperature: 36.8
pCO2, Ven: <18 mmHg — CL (ref 44–60)
pCO2, Ven: 32 mmHg — ABNORMAL LOW (ref 44–60)
pH, Ven: 7.12 — CL (ref 7.25–7.43)
pH, Ven: 7.4 (ref 7.25–7.43)
pO2, Ven: 63 mmHg — ABNORMAL HIGH (ref 32–45)
pO2, Ven: 70 mmHg — ABNORMAL HIGH (ref 32–45)

## 2024-02-12 LAB — BETA-HYDROXYBUTYRIC ACID
Beta-Hydroxybutyric Acid: >8 mmol/L — ABNORMAL HIGH (ref 0.05–0.27)
Beta-Hydroxybutyric Acid: 7.35 mmol/L — ABNORMAL HIGH (ref 0.05–0.27)
Beta-Hydroxybutyric Acid: 6.16 mmol/L — ABNORMAL HIGH (ref 0.05–0.27)
Beta-Hydroxybutyric Acid: 4.52 mmol/L — ABNORMAL HIGH (ref 0.05–0.27)

## 2024-02-12 LAB — MRSA NEXT GEN BY PCR, NASAL: MRSA by PCR Next Gen: NOT DETECTED

## 2024-02-12 LAB — HIV ANTIBODY (ROUTINE TESTING W REFLEX): HIV Screen 4th Generation wRfx: NONREACTIVE

## 2024-02-12 LAB — LACTIC ACID, PLASMA: Lactic Acid, Venous: 1.5 mmol/L (ref 0.5–1.9)

## 2024-02-12 LAB — T4, FREE: Free T4: 0.69 ng/dL (ref 0.61–1.12)

## 2024-02-12 LAB — OSMOLALITY: Osmolality: 378 mosm/kg (ref 275–295)

## 2024-02-12 LAB — TSH: TSH: 0.643 u[IU]/mL (ref 0.350–4.500)

## 2024-02-12 MED ORDER — LISINOPRIL 10 MG PO TABS
20.0000 mg | ORAL_TABLET | Freq: Every day | ORAL | Status: DC
  Administered 2024-02-13 – 2024-02-14 (×2): 20 mg via ORAL
  Filled 2024-02-12 (×2): qty 2

## 2024-02-12 MED ORDER — LORAZEPAM 2 MG/ML IJ SOLN
1.0000 mg | Freq: Once | INTRAMUSCULAR | Status: AC
  Administered 2024-02-12: 1 mg via INTRAVENOUS
  Filled 2024-02-12: qty 1

## 2024-02-12 MED ORDER — POTASSIUM CHLORIDE 10 MEQ/100ML IV SOLN
10.0000 meq | INTRAVENOUS | Status: AC
  Administered 2024-02-12 (×4): 10 meq via INTRAVENOUS
  Filled 2024-02-12 (×2): qty 100

## 2024-02-12 MED ORDER — METOPROLOL TARTRATE 25 MG PO TABS
100.0000 mg | ORAL_TABLET | Freq: Two times a day (BID) | ORAL | Status: DC
  Filled 2024-02-12: qty 4

## 2024-02-12 MED ORDER — ORAL CARE MOUTH RINSE
15.0000 mL | OROMUCOSAL | Status: DC
  Administered 2024-02-12 – 2024-02-18 (×24): 15 mL via OROMUCOSAL

## 2024-02-12 MED ORDER — METOPROLOL TARTRATE 5 MG/5ML IV SOLN: 5.0000 mg | Freq: Four times a day (QID) | INTRAVENOUS | Status: DC

## 2024-02-12 MED ORDER — ROSUVASTATIN CALCIUM 20 MG PO TABS
20.0000 mg | ORAL_TABLET | Freq: Every day | ORAL | Status: DC
  Administered 2024-02-13 – 2024-02-14 (×2): 20 mg via ORAL
  Filled 2024-02-12 (×3): qty 1

## 2024-02-12 MED ORDER — OLANZAPINE 5 MG PO TABS
5.0000 mg | ORAL_TABLET | Freq: Every day | ORAL | Status: DC
  Administered 2024-02-13 – 2024-02-14 (×2): 5 mg via ORAL
  Filled 2024-02-12 (×3): qty 1

## 2024-02-12 MED ORDER — LACTATED RINGERS IV BOLUS
1000.0000 mL | Freq: Once | INTRAVENOUS | Status: AC
Start: 2024-02-13 — End: 2024-02-13
  Administered 2024-02-12: 1000 mL via INTRAVENOUS

## 2024-02-12 MED ORDER — HYDROXYZINE HCL 25 MG PO TABS: 50.0000 mg | ORAL_TABLET | Freq: Three times a day (TID) | ORAL | Status: DC | PRN

## 2024-02-12 MED ORDER — POTASSIUM CHLORIDE 10 MEQ/100ML IV SOLN
10.0000 meq | INTRAVENOUS | Status: DC
Start: 2024-02-12 — End: 2024-02-12
  Filled 2024-02-12 (×2): qty 100

## 2024-02-12 MED ORDER — CHLORHEXIDINE GLUCONATE CLOTH 2 % EX PADS
6.0000 | MEDICATED_PAD | Freq: Every day | CUTANEOUS | Status: DC
  Administered 2024-02-12 – 2024-02-18 (×7): 6 via TOPICAL

## 2024-02-12 MED ORDER — METOPROLOL TARTRATE 5 MG/5ML IV SOLN
10.0000 mg | Freq: Four times a day (QID) | INTRAVENOUS | Status: DC
  Administered 2024-02-12 – 2024-02-15 (×4): 10 mg via INTRAVENOUS
  Filled 2024-02-12 (×6): qty 10

## 2024-02-12 MED ORDER — METOPROLOL TARTRATE 5 MG/5ML IV SOLN
10.0000 mg | Freq: Once | INTRAVENOUS | Status: AC
  Administered 2024-02-12: 10 mg via INTRAVENOUS
  Filled 2024-02-12: qty 10

## 2024-02-12 MED ORDER — ORAL CARE MOUTH RINSE: 15.0000 mL | OROMUCOSAL | Status: DC | PRN

## 2024-02-12 MED ORDER — DEXMEDETOMIDINE HCL IN NACL 200 MCG/50ML IV SOLN
0.0000 ug/kg/h | INTRAVENOUS | Status: DC
  Administered 2024-02-12: 0.3 ug/kg/h via INTRAVENOUS
  Administered 2024-02-12: 0.4 ug/kg/h via INTRAVENOUS
  Administered 2024-02-13: 0.7 ug/kg/h via INTRAVENOUS
  Administered 2024-02-13 (×2): 0.5 ug/kg/h via INTRAVENOUS
  Administered 2024-02-13: 0.7 ug/kg/h via INTRAVENOUS
  Administered 2024-02-13: 0.3 ug/kg/h via INTRAVENOUS
  Administered 2024-02-14: 0.5 ug/kg/h via INTRAVENOUS
  Administered 2024-02-14: 0.7 ug/kg/h via INTRAVENOUS
  Administered 2024-02-14: 0.5 ug/kg/h via INTRAVENOUS
  Administered 2024-02-14 (×2): 0.7 ug/kg/h via INTRAVENOUS
  Administered 2024-02-15 (×2): 0.5 ug/kg/h via INTRAVENOUS
  Filled 2024-02-12 (×17): qty 50

## 2024-02-12 MED ORDER — DEXTROSE IN LACTATED RINGERS 5 % IV SOLN: INTRAVENOUS | Status: AC

## 2024-02-12 MED ORDER — LACTATED RINGERS IV SOLN: INTRAVENOUS | Status: AC

## 2024-02-12 MED ORDER — LAMOTRIGINE 25 MG PO TABS
150.0000 mg | ORAL_TABLET | Freq: Every day | ORAL | Status: DC
  Administered 2024-02-13 – 2024-02-14 (×2): 150 mg via ORAL
  Filled 2024-02-12 (×3): qty 2

## 2024-02-12 MED ORDER — OLANZAPINE 5 MG PO TABS
15.0000 mg | ORAL_TABLET | Freq: Every day | ORAL | Status: DC
  Administered 2024-02-13 (×2): 15 mg via ORAL
  Filled 2024-02-12 (×4): qty 1

## 2024-02-12 MED ORDER — LORAZEPAM 2 MG/ML IJ SOLN
1.0000 mg | Freq: Once | INTRAMUSCULAR | Status: AC | PRN
  Administered 2024-02-12: 1 mg via INTRAVENOUS
  Filled 2024-02-12: qty 1

## 2024-02-12 NOTE — Progress Notes (Signed)
 NAME:  Philip [name], MRN:  [id number], DOB:  [birth year], LOS: 1 ADMISSION DATE:  02/11/2024, CONSULTATION DATE: 02/11/2024 REFERRING MD: ED physician, CHIEF COMPLAINT: DKA  History of Present Illness:  33 yo male presented with ams. Pt was last seen normal at 1930 02/10/24. His family states that he has been in a manic state for some time and has not been reliably taking his home medications for approximately a week. Family also endorses that he has not likely been taking his metformin for much longer as he states to family it makes him sick.    Over the past week he has been reporting to his sister ( whom he lives with) he has been having stomach discomfort and that he has been nauseous as well as vomiting. He denied fever, chills, cp, dizziness, diarrhea. Sister is unable to comment on these observations.    Upon presentation ot was noted to be lethargic with kussmaul respirations. He is unable to answer simple questions at this time, he is moving spontaneously but otherwise just moans continuously when stimulated.  Pertinent  Medical History  Hypertension Diabetes  Significant Hospital Events: Including procedures, antibiotic start and stop dates in addition to other pertinent events   4/14-admitted to ICU  Interim History / Subjective:  Sedated No significant overnight events  Objective   Blood pressure (!) 142/80, pulse (!) 127, temperature (!) 97.3 F (36.3 C), temperature source Axillary, resp. rate 19, height 6\' 1"  (1.854 m), weight 89.8 kg, SpO2 96%.        Intake/Output Summary (Last 24 hours) at 02/12/2024 0902 Last data filed at 02/12/2024 4098 Gross per 24 hour  Intake 796.98 ml  Output --  Net 796.98 ml   Filed Weights   02/12/24 0105  Weight: 89.8 kg    Examination: General: Young gentleman, was unable to arouse him this morning HENT: Dry oral mucosa Lungs: Clear breath sounds Cardiovascular: S1-S2 appreciated Abdomen: Soft, bowel sounds  appreciated Extremities: No clubbing, no edema Neuro: Remains sedated GU: Fair output  Resolved Hospital Problem list     Assessment & Plan:  Diabetic ketoacidosis - On insulin drip - On lactated Ringer's - Continue DKA management per protocol - Anion gap still elevated at 25  History of bipolar disorder - Reinitiated home Zyprexa  Acute kidney injury - Continue fluid resuscitation  Will reinitiate home medications  Metabolic encephalopathy -Wean off Precedex as tolerated  Best Practice (right click and "Reselect all SmartList Selections" daily)   Diet/type: NPO w/ oral meds DVT prophylaxis SCD Pressure ulcer(s):  GI prophylaxis: N/A Lines: N/A Foley:  N/A Code Status:  full code Last date of multidisciplinary goals of care discussion [pending]  Labs   CBC: Recent Labs  Lab 02/11/24 2139  WBC 16.6*  NEUTROABS 14.1*  HGB 13.7  HCT 42.0  MCV 82.5  PLT 413*    Basic Metabolic Panel: Recent Labs  Lab 02/11/24 2139 02/12/24 0616  NA 125* 139  K 4.9 3.4*  CL 82* 102  CO2 <7* 11*  GLUCOSE 1,128* 286*  BUN 72* 69*  CREATININE 3.28* 2.52*  CALCIUM 9.9 10.9*   GFR: Estimated Creatinine Clearance: 46.7 mL/min (A) (by C-G formula based on SCr of 2.52 mg/dL (H)). Recent Labs  Lab 02/11/24 2139  WBC 16.6*  LATICACIDVEN 4.6*    Liver Function Tests: Recent Labs  Lab 02/11/24 2139  AST 21  ALT 22  ALKPHOS 113  BILITOT 1.9*  PROT 8.5*  ALBUMIN 4.7   No results  for input(s): "LIPASE", "AMYLASE" in the last 168 hours. No results for input(s): "AMMONIA" in the last 168 hours.  ABG    Component Value Date/Time   PHART 7.477 (H) 05/27/2022 0508   PCO2ART 35.2 05/27/2022 0508   PO2ART 178 (H) 05/27/2022 0508   HCO3 3.9 (L) 02/11/2024 2139   TCO2 27 05/27/2022 0508   ACIDBASEDEF 23.1 (H) 02/11/2024 2139   O2SAT 88.3 02/11/2024 2139     Coagulation Profile: No results for input(s): "INR", "PROTIME" in the last 168 hours.  Cardiac  Enzymes: No results for input(s): "CKTOTAL", "CKMB", "CKMBINDEX", "TROPONINI" in the last 168 hours.  HbA1C: Hgb A1c MFr Bld  Date/Time Value Ref Range Status  09/03/2014 06:24 AM 5.5 <5.7 % Final    Comment:    (NOTE)                                                                       According to the ADA Clinical Practice Recommendations for 2011, when HbA1c is used as a screening test:  >=6.5%   Diagnostic of Diabetes Mellitus           (if abnormal result is confirmed) 5.7-6.4%   Increased risk of developing Diabetes Mellitus References:Diagnosis and Classification of Diabetes Mellitus,Diabetes Care,2011,34(Suppl 1):S62-S69 and Standards of Medical Care in         Diabetes - 2011,Diabetes Care,2011,34 (Suppl 1):S11-S61.     CBG: Recent Labs  Lab 02/12/24 0514 02/12/24 0615 02/12/24 0715 02/12/24 0805 02/12/24 0854  GLUCAP 358* 247* 221* 234* 217*    Review of Systems:   Sedated  Past Medical History:  He,  has a past medical history of ADHD (attention deficit hyperactivity disorder), Asthma, Bipolar affective disorder (HCC), and Schizophrenia (HCC).   Surgical History:   Past Surgical History:  Procedure Laterality Date   NO PAST SURGERIES       Social History:   reports that he has been smoking cigarettes. He has a 6 pack-year smoking history. He has never used smokeless tobacco. He reports current alcohol use of about 1.0 standard drink of alcohol per week. He reports current drug use. Drug: Marijuana.   Family History:  His family history includes Hypertension in his mother.   Allergies Allergies  Allergen Reactions   Pork-Derived Products Other (See Comments)    Does not eat it   Shrimp [Shellfish Allergy] Anaphylaxis     Home Medications  Prior to Admission medications   Medication Sig Start Date End Date Taking? Authorizing Provider  hydrOXYzine (ATARAX/VISTARIL) 50 MG tablet Take 1 tablet (50 mg total) by mouth 3 (three) times daily as needed  for anxiety. Patient taking differently: Take 50 mg by mouth in the morning, at noon, and at bedtime. 12/24/18  Yes Suzi Essex, MD  lamoTRIgine (LAMICTAL) 150 MG tablet Take 150 mg by mouth daily.   Yes [provider]  lisinopril (ZESTRIL) 20 MG tablet Take 1 tablet (20 mg total) by mouth daily. 06/01/22  Yes Meuth, Brooke A, PA-C  metFORMIN (GLUCOPHAGE) 500 MG tablet Take 250 mg by mouth 2 (two) times daily with a meal.   Yes [provider]  metoprolol tartrate (LOPRESSOR) 100 MG tablet Take 1 tablet (100 mg total) by mouth 2 (two)  times daily. 06/01/22  Yes Meuth, Brooke A, PA-C  OLANZapine (ZYPREXA) 15 MG tablet Take 15 mg by mouth at bedtime.   Yes [provider]  OLANZapine (ZYPREXA) 5 MG tablet Take 5 mg by mouth daily.   Yes [provider]  rosuvastatin (CRESTOR) 20 MG tablet Take 20 mg by mouth daily.   Yes [provider]    The patient is critically ill with multiple organ systems failure and requires high complexity decision making for assessment and support, frequent evaluation and titration of therapies, application of advanced monitoring technologies and extensive interpretation of multiple databases. Critical Care Time devoted to patient care services described in this note independent of APP/resident time (if applicable)  is 32 minutes.   Myer Artis MD [facility] Personal pager: See Amion If unanswered, please page CCM On-call: [phone]

## 2024-02-12 NOTE — Progress Notes (Signed)
 eLink Physician-Brief Progress Note Patient Name: Philip [name] DOB: 1991/06/21 MRN: [id number]   Date of Service  02/12/2024  HPI/Events of Note  25 M history of DM, hypertension and bipolar disorder presented with altered sensorium. Family reports he has not been taking his medications for the past week and been in a manic state. Work up reveals DKA. Glucose 1128, CO2 < 7, creatinine 3.28, lactic acid 4.6, BHB >8.  Seen restless, still with eyes closed. BSRN asking for prn Ativan 1 mg as this has worked well for him. He does take clonazepam at home.  eICU Interventions  Ativan 1 mg IV x 1 ordered prn agitation Continue insulin drip and fluids as per DKA protocol Will need psych consult     Intervention Category Minor Interventions: Agitation / anxiety - evaluation and management Evaluation Type: New Patient Evaluation  [name] 02/12/2024, 2:31 AM

## 2024-02-12 NOTE — Progress Notes (Addendum)
 eLink Physician-Brief Progress Note Patient Name: Philip [name] DOB: [birth year] MRN: [id number]   Date of Service  02/12/2024  HPI/Events of Note  33 year old that initially presented with diabetic ketoacidosis complicated by acute encephalopathy.  Has been in restraints, about to expire.  Fluid orders also expiring.  eICU Interventions  Review fluid orders  Renew restraints due to agitation   2304 -patient has been having oliguria for the shift.  442 in the bladder.  Maintain external urinary catheter.  Address urinary retention guidelines.  In-N-Out cath as needed.  Additional liter of IVF.  Intervention Category Minor Interventions: Routine modifications to care plan (e.g. PRN medications for pain, fever)  Keyari Kleeman 02/12/2024, 10:35 PM

## 2024-02-12 NOTE — Progress Notes (Signed)
 eLink Physician-Brief Progress Note Patient Name: Philip [name] DOB: September 03, 1991 MRN: [id number]   Date of Service  02/12/2024  HPI/Events of Note  Patient agitated, seen on camera trying to get out of bed. Asking for the precedex gtt if possible  eICU Interventions  Precedex drip ordered to titrate to effect Watch out for respiratory depression     Intervention Category Minor Interventions: Agitation / anxiety - evaluation and management  [name] 02/12/2024, 5:02 AM

## 2024-02-12 NOTE — Inpatient Diabetes Management (Signed)
 Inpatient Diabetes Program Recommendations  AACE/ADA: New Consensus Statement on Inpatient Glycemic Control (2015)  Target Ranges:  Prepandial:   less than 140 mg/dL      Peak postprandial:   less than 180 mg/dL (1-2 hours)      Critically ill patients:  140 - 180 mg/dL   Lab Results  Component Value Date   GLUCAP 299 (H) 02/12/2024   HGBA1C 5.5 09/03/2014    Review of Glycemic Control  Diabetes history: DM2 Outpatient Diabetes medications: metformin 250 mg BID Current orders for Inpatient glycemic control: IV insulin per [employer] for DKA  HgbA1C 6.9%  Inpatient Diabetes Program Recommendations:    Continue IV insulin until criteria met for discontinuation of drip.  Need BHB - last one 4/15 @ 1600  Not appropriate to speak with pt today about his diabetes and glucose control.  Will f/u in am.   Thank you. Joni Net, RD, LDN, CDCES Inpatient Diabetes Coordinator [phone]
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
## 2024-02-13 ENCOUNTER — Inpatient Hospital Stay (HOSPITAL_COMMUNITY): Payer: MEDICAID

## 2024-02-13 DIAGNOSIS — E876 Hypokalemia: Secondary | ICD-10-CM

## 2024-02-13 DIAGNOSIS — N179 Acute kidney failure, unspecified: Secondary | ICD-10-CM | POA: Diagnosis not present

## 2024-02-13 DIAGNOSIS — E1110 Type 2 diabetes mellitus with ketoacidosis without coma: Secondary | ICD-10-CM | POA: Diagnosis not present

## 2024-02-13 DIAGNOSIS — G9341 Metabolic encephalopathy: Secondary | ICD-10-CM | POA: Diagnosis not present

## 2024-02-13 LAB — CBC WITH DIFFERENTIAL/PLATELET
Abs Immature Granulocytes: 0.07 10*3/uL (ref 0.00–0.07)
Basophils Absolute: 0 10*3/uL (ref 0.0–0.1)
Basophils Relative: 0 %
Eosinophils Absolute: 0 10*3/uL (ref 0.0–0.5)
Eosinophils Relative: 0 %
HCT: 33.2 % — ABNORMAL LOW (ref 39.0–52.0)
Hemoglobin: 10.8 g/dL — ABNORMAL LOW (ref 13.0–17.0)
Immature Granulocytes: 1 %
Lymphocytes Relative: 14 %
Lymphs Abs: 1.6 10*3/uL (ref 0.7–4.0)
MCH: 26.3 pg (ref 26.0–34.0)
MCHC: 32.5 g/dL (ref 30.0–36.0)
MCV: 80.8 fL (ref 80.0–100.0)
Monocytes Absolute: 1 10*3/uL (ref 0.1–1.0)
Monocytes Relative: 9 %
Neutro Abs: 8.5 10*3/uL — ABNORMAL HIGH (ref 1.7–7.7)
Neutrophils Relative %: 76 %
Platelets: 223 10*3/uL (ref 150–400)
RBC: 4.11 MIL/uL — ABNORMAL LOW (ref 4.22–5.81)
RDW: 12.3 % (ref 11.5–15.5)
WBC: 11.1 10*3/uL — ABNORMAL HIGH (ref 4.0–10.5)
nRBC: 0 % (ref 0.0–0.2)

## 2024-02-13 LAB — GLUCOSE, CAPILLARY
Glucose-Capillary: 178 mg/dL — ABNORMAL HIGH (ref 70–99)
Glucose-Capillary: 183 mg/dL — ABNORMAL HIGH (ref 70–99)
Glucose-Capillary: 202 mg/dL — ABNORMAL HIGH (ref 70–99)
Glucose-Capillary: 205 mg/dL — ABNORMAL HIGH (ref 70–99)
Glucose-Capillary: 205 mg/dL — ABNORMAL HIGH (ref 70–99)
Glucose-Capillary: 208 mg/dL — ABNORMAL HIGH (ref 70–99)
Glucose-Capillary: 212 mg/dL — ABNORMAL HIGH (ref 70–99)
Glucose-Capillary: 217 mg/dL — ABNORMAL HIGH (ref 70–99)
Glucose-Capillary: 222 mg/dL — ABNORMAL HIGH (ref 70–99)
Glucose-Capillary: 229 mg/dL — ABNORMAL HIGH (ref 70–99)
Glucose-Capillary: 233 mg/dL — ABNORMAL HIGH (ref 70–99)
Glucose-Capillary: 239 mg/dL — ABNORMAL HIGH (ref 70–99)
Glucose-Capillary: 239 mg/dL — ABNORMAL HIGH (ref 70–99)
Glucose-Capillary: 240 mg/dL — ABNORMAL HIGH (ref 70–99)
Glucose-Capillary: 240 mg/dL — ABNORMAL HIGH (ref 70–99)
Glucose-Capillary: 249 mg/dL — ABNORMAL HIGH (ref 70–99)
Glucose-Capillary: 262 mg/dL — ABNORMAL HIGH (ref 70–99)
Glucose-Capillary: 403 mg/dL — ABNORMAL HIGH (ref 70–99)

## 2024-02-13 LAB — COMPREHENSIVE METABOLIC PANEL WITH GFR
ALT: 20 U/L (ref 0–44)
AST: 63 U/L — ABNORMAL HIGH (ref 15–41)
Albumin: 3.3 g/dL — ABNORMAL LOW (ref 3.5–5.0)
Alkaline Phosphatase: 72 U/L (ref 38–126)
Anion gap: 13 (ref 5–15)
BUN: 66 mg/dL — ABNORMAL HIGH (ref 6–20)
CO2: 18 mmol/L — ABNORMAL LOW (ref 22–32)
Calcium: 9.7 mg/dL (ref 8.9–10.3)
Chloride: 110 mmol/L (ref 98–111)
Creatinine, Ser: 2.61 mg/dL — ABNORMAL HIGH (ref 0.61–1.24)
GFR, Estimated: 32 mL/min — ABNORMAL LOW (ref >60)
Glucose, Bld: 245 mg/dL — ABNORMAL HIGH (ref 70–99)
Potassium: 3.1 mmol/L — ABNORMAL LOW (ref 3.5–5.1)
Sodium: 141 mmol/L (ref 135–145)
Total Bilirubin: 1.4 mg/dL — ABNORMAL HIGH (ref 0.0–1.2)
Total Protein: 6.3 g/dL — ABNORMAL LOW (ref 6.5–8.1)

## 2024-02-13 LAB — MAGNESIUM: Magnesium: 2.9 mg/dL — ABNORMAL HIGH (ref 1.7–2.4)

## 2024-02-13 LAB — PHOSPHORUS: Phosphorus: <1 mg/dL — CL (ref 2.5–4.6)

## 2024-02-13 MED ORDER — POTASSIUM PHOSPHATES 15 MMOLE/5ML IV SOLN
30.0000 mmol | Freq: Once | INTRAVENOUS | Status: AC
  Administered 2024-02-13: 30 mmol via INTRAVENOUS
  Filled 2024-02-13 (×2): qty 10

## 2024-02-13 MED ORDER — HALOPERIDOL LACTATE 5 MG/ML IJ SOLN: 2.0000 mg | Freq: Once | INTRAMUSCULAR | Status: AC

## 2024-02-13 MED ORDER — INSULIN ASPART 100 UNIT/ML IJ SOLN
0.0000 [IU] | INTRAMUSCULAR | Status: DC
  Administered 2024-02-13: 8 [IU] via SUBCUTANEOUS
  Administered 2024-02-14 (×2): 15 [IU] via SUBCUTANEOUS

## 2024-02-13 MED ORDER — MIDAZOLAM HCL 2 MG/2ML IJ SOLN
1.0000 mg | Freq: Once | INTRAMUSCULAR | Status: AC
  Administered 2024-02-13: 1 mg via INTRAVENOUS
  Filled 2024-02-13: qty 2

## 2024-02-13 MED ORDER — INSULIN ASPART 100 UNIT/ML IV SOLN
15.0000 [IU] | Freq: Once | INTRAVENOUS | Status: AC
  Administered 2024-02-14: 15 [IU] via INTRAVENOUS

## 2024-02-13 MED ORDER — VITAL HIGH PROTEIN PO LIQD
1000.0000 mL | ORAL | Status: DC
  Administered 2024-02-13: 1000 mL

## 2024-02-13 MED ORDER — LACTATED RINGERS IV BOLUS
500.0000 mL | Freq: Once | INTRAVENOUS | Status: AC
  Administered 2024-02-13: 500 mL via INTRAVENOUS

## 2024-02-13 MED ORDER — INSULIN GLARGINE-YFGN 100 UNIT/ML ~~LOC~~ SOLN
10.0000 [IU] | Freq: Every day | SUBCUTANEOUS | Status: DC
  Administered 2024-02-13: 10 [IU] via SUBCUTANEOUS
  Filled 2024-02-13: qty 0.1

## 2024-02-13 MED ORDER — HALOPERIDOL LACTATE 5 MG/ML IJ SOLN
INTRAMUSCULAR | Status: AC
Start: 2024-02-13 — End: 2024-02-13
  Administered 2024-02-13: 2 mg via INTRAVENOUS
  Filled 2024-02-13: qty 1

## 2024-02-13 MED ORDER — PROSOURCE TF20 ENFIT COMPATIBL EN LIQD
60.0000 mL | Freq: Every day | ENTERAL | Status: DC
  Administered 2024-02-13 – 2024-02-15 (×3): 60 mL
  Filled 2024-02-13 (×3): qty 60

## 2024-02-13 MED ORDER — POTASSIUM CHLORIDE 10 MEQ/100ML IV SOLN
10.0000 meq | INTRAVENOUS | Status: AC
Start: 2024-02-13 — End: 2024-02-13
  Administered 2024-02-13 (×6): 10 meq via INTRAVENOUS
  Filled 2024-02-13 (×6): qty 100

## 2024-02-13 MED ORDER — FREE WATER
200.0000 mL | Freq: Four times a day (QID) | Status: DC
  Administered 2024-02-13 – 2024-02-14 (×5): 200 mL

## 2024-02-13 NOTE — Progress Notes (Addendum)
 NAME:  Philip [name], MRN:  [id number], DOB:  [birth year], LOS: 3 ADMISSION DATE:  02/11/2024, CONSULTATION DATE: 02/11/2024 REFERRING MD: ED physician, CHIEF COMPLAINT: DKA  History of Present Illness:  33 yo male presented with ams. Pt was last seen normal at 1930 02/10/24. His family states that he has been in a manic state for some time and has not been reliably taking his home medications for approximately a week. Family also endorses that he has not likely been taking his metformin for much longer as he states to family it makes him sick.    Over the past week he has been reporting to his sister ( whom he lives with) he has been having stomach discomfort and that he has been nauseous as well as vomiting. He denied fever, chills, cp, dizziness, diarrhea. Sister is unable to comment on these observations.    Upon presentation ot was noted to be lethargic with kussmaul respirations. He is unable to answer simple questions at this time, he is moving spontaneously but otherwise just moans continuously when stimulated.  Pertinent  Medical History  Hypertension Diabetes  Significant Hospital Events: Including procedures, antibiotic start and stop dates in addition to other pertinent events   4/14-admitted to ICU  Interim History / Subjective:  Sedated Still getting agitated He has a cortrak in place Started on tube feeds Sugars have been riding a little higher  Objective   Blood pressure 139/77, pulse 89, temperature 99.2 F (37.3 C), temperature source Axillary, resp. rate 18, height 6\' 1"  (1.854 m), weight 98.9 kg, SpO2 96%.        Intake/Output Summary (Last 24 hours) at 02/14/2024 0826 Last data filed at 02/14/2024 0557 Gross per 24 hour  Intake 4660.62 ml  Output 1100 ml  Net 3560.62 ml   Filed Weights   02/12/24 0105 02/13/24 0500 02/14/24 0500  Weight: 89.8 kg 94.5 kg 98.9 kg    Examination: General: Young gentleman, minimally arousable HENT: Dry oral mucosa Lungs:  Clear breath sounds Cardiovascular: S1-S2 appreciated Abdomen: Soft, bowel sounds appreciated Extremities: No edema, no clubbing Neuro: Remains sedated GU: Fair output  Resolved Hospital Problem list     Assessment & Plan:   Diabetic ketoacidosis - Transition of insulin pump on 02/13/2024 - Sugars a little higher and has been corrected - Anion gap remains 13, bicarb is lower - Will continue to try to control his sugar as best as possible  Metabolic encephalopathy - Still not very redirectable - Requiring Precedex - Requiring restraints - Unclear whether part of this disposition is his underlying bipolar disorder - His home Zyprexa has been was restarted   Acute kidney injury - BUN/creatinine stable, bump in his creatinine - Continue fluid resuscitation - Avoid nephrotoxic medications. - Will give an LR bolus and resume LR at 100 cc an hour  History of bipolar disorder - Home Zyprexa reinitiated  Continue to replete electrolytes  Dilutional anemia   Best Practice (right click and "Reselect all SmartList Selections" daily)   Diet/type: On tube feeds DVT prophylaxis SCD Pressure ulcer(s):  GI prophylaxis: N/A Lines: N/A Foley:  N/A Code Status:  full code Last date of multidisciplinary goals of care discussion [pending] Updated patients sister  Labs   CBC: Recent Labs  Lab 02/11/24 2139 02/13/24 0303 02/14/24 0539  WBC 16.6* 11.1* 7.1  NEUTROABS 14.1* 8.5*  --   HGB 13.7 10.8* 9.5*  HCT 42.0 33.2* 29.1*  MCV 82.5 80.8 82.0  PLT 413* 223 190  Basic Metabolic Panel: Recent Labs  Lab 02/12/24 0838 02/12/24 1155 02/12/24 1618 02/13/24 0303 02/13/24 1657 02/14/24 0146 02/14/24 0539  NA 141 142 141 141  --   --  135  K 3.6 3.4* 3.1* 3.1*  --   --  3.5  CL 104 104 107 110  --   --  108  CO2 12* 15* 16* 18*  --   --  14*  GLUCOSE 246* 224* 236* 245*  --  514* 609*  BUN 67* 68* 67* 66*  --   --  65*  CREATININE 2.55* 2.45* 2.45* 2.61*  --   --   3.47*  CALCIUM 10.8* 10.6* 10.2 9.7  --   --  8.7*  MG  --   --   --   --  2.9*  --  2.6*  PHOS  --   --   --   --  <1.0*  --  1.5*   GFR: Estimated Creatinine Clearance: 37.1 mL/min (A) (by C-G formula based on SCr of 3.47 mg/dL (H)). Recent Labs  Lab 02/11/24 2139 02/12/24 1155 02/13/24 0303 02/14/24 0539  WBC 16.6*  --  11.1* 7.1  LATICACIDVEN 4.6* 1.5  --   --     Liver Function Tests: Recent Labs  Lab 02/11/24 2139 02/13/24 0303 02/14/24 0539  AST 21 63* 36  ALT 22 20 20   ALKPHOS 113 72 72  BILITOT 1.9* 1.4* 1.0  PROT 8.5* 6.3* 5.3*  ALBUMIN 4.7 3.3* 2.6*   No results for input(s): "LIPASE", "AMYLASE" in the last 168 hours. No results for input(s): "AMMONIA" in the last 168 hours.  ABG    Component Value Date/Time   PHART 7.477 (H) 05/27/2022 0508   PCO2ART 35.2 05/27/2022 0508   PO2ART 178 (H) 05/27/2022 0508   HCO3 19.8 (L) 02/12/2024 1618   TCO2 27 05/27/2022 0508   ACIDBASEDEF 4.0 (H) 02/12/2024 1618   O2SAT 96 02/12/2024 1618     Coagulation Profile: No results for input(s): "INR", "PROTIME" in the last 168 hours.  Cardiac Enzymes: No results for input(s): "CKTOTAL", "CKMB", "CKMBINDEX", "TROPONINI" in the last 168 hours.  HbA1C: Hgb A1c MFr Bld  Date/Time Value Ref Range Status  09/03/2014 06:24 AM 5.5 <5.7 % Final    Comment:    (NOTE)                                                                       According to the ADA Clinical Practice Recommendations for 2011, when HbA1c is used as a screening test:  >=6.5%   Diagnostic of Diabetes Mellitus           (if abnormal result is confirmed) 5.7-6.4%   Increased risk of developing Diabetes Mellitus References:Diagnosis and Classification of Diabetes Mellitus,Diabetes Care,2011,34(Suppl 1):S62-S69 and Standards of Medical Care in         Diabetes - 2011,Diabetes Care,2011,34 (Suppl 1):S11-S61.     CBG: Recent Labs  Lab 02/13/24 2316 02/14/24 0233 02/14/24 0408 02/14/24 0518  02/14/24 0749  GLUCAP 403* 445* 491* >600* 590*    Review of Systems:   Sedated  Past Medical History:  He,  has a past medical history of ADHD (attention deficit hyperactivity disorder), Asthma, Bipolar affective disorder (  HCC), and Schizophrenia (HCC).   Surgical History:   Past Surgical History:  Procedure Laterality Date   NO PAST SURGERIES       Social History:   reports that he has been smoking cigarettes. He has a 6 pack-year smoking history. He has never used smokeless tobacco. He reports current alcohol use of about 1.0 standard drink of alcohol per week. He reports current drug use. Drug: Marijuana.   Family History:  His family history includes Hypertension in his mother.   Allergies Allergies  Allergen Reactions   Pork-Derived Products Other (See Comments)    Does not eat it   Shrimp [Shellfish Allergy] Anaphylaxis     The patient is critically ill with multiple organ systems failure and requires high complexity decision making for assessment and support, frequent evaluation and titration of therapies, application of advanced monitoring technologies and extensive interpretation of multiple databases. Critical Care Time devoted to patient care services described in this note independent of APP/resident time (if applicable)  is 33 minutes.   Myer Artis MD [facility] Personal pager: See Amion If unanswered, please page CCM On-call: #[phone]

## 2024-02-13 NOTE — Progress Notes (Signed)
 NAME:  Philip [name], MRN:  [id number], DOB:  05/13/1991, LOS: 2 ADMISSION DATE:  02/11/2024, CONSULTATION DATE: 02/11/2024 REFERRING MD: ED physician, CHIEF COMPLAINT: DKA  History of Present Illness:  33 yo male presented with ams. Pt was last seen normal at 1930 02/10/24. His family states that he has been in a manic state for some time and has not been reliably taking his home medications for approximately a week. Family also endorses that he has not likely been taking his metformin for much longer as he states to family it makes him sick.    Over the past week he has been reporting to his sister ( whom he lives with) he has been having stomach discomfort and that he has been nauseous as well as vomiting. He denied fever, chills, cp, dizziness, diarrhea. Sister is unable to comment on these observations.    Upon presentation ot was noted to be lethargic with kussmaul respirations. He is unable to answer simple questions at this time, he is moving spontaneously but otherwise just moans continuously when stimulated.  Pertinent  Medical History  Hypertension Diabetes  Significant Hospital Events: Including procedures, antibiotic start and stop dates in addition to other pertinent events   4/14-admitted to ICU  Interim History / Subjective:  Sedated Still getting agitated Currently remains on Precedex for agitation  Objective   Blood pressure 112/60, pulse 90, temperature 99.4 F (37.4 C), temperature source Axillary, resp. rate 20, height 6\' 1"  (1.854 m), weight 94.5 kg, SpO2 96%.        Intake/Output Summary (Last 24 hours) at 02/13/2024 0750 Last data filed at 02/13/2024 0502 Gross per 24 hour  Intake 2692.88 ml  Output 1150 ml  Net 1542.88 ml   Filed Weights   02/12/24 0105 02/13/24 0500  Weight: 89.8 kg 94.5 kg    Examination: General: Young gentleman, minimally arousable HENT: Dry oral mucosa Lungs: Clear breath sounds Cardiovascular: S1-S2 appreciated Abdomen:  Soft, bowel sounds appreciated Extremities: No clubbing, no edema Neuro: Remains sedated GU: Fair output  Resolved Hospital Problem list     Assessment & Plan:   Diabetic ketoacidosis - On insulin drip - anion gap finally 13 -Continue current treatment  Metabolic encephalopathy - Still not very redirectable -Requiring Precedex for agitation - Will try and wean Precedex as tolerated  Has not been able to tolerate p.o. medications because of his altered mental status Cortrak attempt failed secondary to significant agitation -Will give low-dose benzodiazepine to see if this assists with being able to place a cortrak  History of bipolar disorder - Will reinitiate home Zyprexa  Acute kidney injury -BUN/creatinine stable -Continue fluid resuscitation -Avoid nephrotoxic medications  Hypokalemia - Being corrected  Continue to monitor electrolytes  Best Practice (right click and "Reselect all SmartList Selections" daily)   Diet/type: NPO w/ oral meds DVT prophylaxis SCD Pressure ulcer(s):  GI prophylaxis: N/A Lines: N/A Foley:  N/A Code Status:  full code Last date of multidisciplinary goals of care discussion [pending]  Labs   CBC: Recent Labs  Lab 02/11/24 2139 02/13/24 0303  WBC 16.6* 11.1*  NEUTROABS 14.1* 8.5*  HGB 13.7 10.8*  HCT 42.0 33.2*  MCV 82.5 80.8  PLT 413* 223    Basic Metabolic Panel: Recent Labs  Lab 02/12/24 0616 02/12/24 0838 02/12/24 1155 02/12/24 1618 02/13/24 0303  NA 139 141 142 141 141  K 3.4* 3.6 3.4* 3.1* 3.1*  CL 102 104 104 107 110  CO2 11* 12* 15* 16* 18*  GLUCOSE 286* 246* 224* 236* 245*  BUN 69* 67* 68* 67* 66*  CREATININE 2.52* 2.55* 2.45* 2.45* 2.61*  CALCIUM 10.9* 10.8* 10.6* 10.2 9.7   GFR: Estimated Creatinine Clearance: 45.1 mL/min (A) (by C-G formula based on SCr of 2.61 mg/dL (H)). Recent Labs  Lab 02/11/24 2139 02/12/24 1155 02/13/24 0303  WBC 16.6*  --  11.1*  LATICACIDVEN 4.6* 1.5  --     Liver  Function Tests: Recent Labs  Lab 02/11/24 2139 02/13/24 0303  AST 21 63*  ALT 22 20  ALKPHOS 113 72  BILITOT 1.9* 1.4*  PROT 8.5* 6.3*  ALBUMIN 4.7 3.3*   No results for input(s): "LIPASE", "AMYLASE" in the last 168 hours. No results for input(s): "AMMONIA" in the last 168 hours.  ABG    Component Value Date/Time   PHART 7.477 (H) 05/27/2022 0508   PCO2ART 35.2 05/27/2022 0508   PO2ART 178 (H) 05/27/2022 0508   HCO3 19.8 (L) 02/12/2024 1618   TCO2 27 05/27/2022 0508   ACIDBASEDEF 4.0 (H) 02/12/2024 1618   O2SAT 96 02/12/2024 1618     Coagulation Profile: No results for input(s): "INR", "PROTIME" in the last 168 hours.  Cardiac Enzymes: No results for input(s): "CKTOTAL", "CKMB", "CKMBINDEX", "TROPONINI" in the last 168 hours.  HbA1C: Hgb A1c MFr Bld  Date/Time Value Ref Range Status  09/03/2014 06:24 AM 5.5 <5.7 % Final    Comment:    (NOTE)                                                                       According to the ADA Clinical Practice Recommendations for 2011, when HbA1c is used as a screening test:  >=6.5%   Diagnostic of Diabetes Mellitus           (if abnormal result is confirmed) 5.7-6.4%   Increased risk of developing Diabetes Mellitus References:Diagnosis and Classification of Diabetes Mellitus,Diabetes Care,2011,34(Suppl 1):S62-S69 and Standards of Medical Care in         Diabetes - 2011,Diabetes Care,2011,34 (Suppl 1):S11-S61.     CBG: Recent Labs  Lab 02/13/24 0254 02/13/24 0354 02/13/24 0505 02/13/24 0612 02/13/24 0706  GLUCAP 249* 222* 205* 229* 239*    Review of Systems:   Sedated  Past Medical History:  He,  has a past medical history of ADHD (attention deficit hyperactivity disorder), Asthma, Bipolar affective disorder (HCC), and Schizophrenia (HCC).   Surgical History:   Past Surgical History:  Procedure Laterality Date   NO PAST SURGERIES       Social History:   reports that he has been smoking cigarettes. He  has a 6 pack-year smoking history. He has never used smokeless tobacco. He reports current alcohol use of about 1.0 standard drink of alcohol per week. He reports current drug use. Drug: Marijuana.   Family History:  His family history includes Hypertension in his mother.   Allergies Allergies  Allergen Reactions   Pork-Derived Products Other (See Comments)    Does not eat it   Shrimp [Shellfish Allergy] Anaphylaxis     Home Medications  Prior to Admission medications   Medication Sig Start Date End Date Taking? Authorizing Provider  hydrOXYzine (ATARAX/VISTARIL) 50 MG tablet Take 1 tablet (50 mg total) by  mouth 3 (three) times daily as needed for anxiety. Patient taking differently: Take 50 mg by mouth in the morning, at noon, and at bedtime. 12/24/18  Yes Suzi Essex, MD  lamoTRIgine (LAMICTAL) 150 MG tablet Take 150 mg by mouth daily.   Yes [provider]  lisinopril (ZESTRIL) 20 MG tablet Take 1 tablet (20 mg total) by mouth daily. 06/01/22  Yes Meuth, Brooke A, PA-C  metFORMIN (GLUCOPHAGE) 500 MG tablet Take 250 mg by mouth 2 (two) times daily with a meal.   Yes [provider]  metoprolol tartrate (LOPRESSOR) 100 MG tablet Take 1 tablet (100 mg total) by mouth 2 (two) times daily. 06/01/22  Yes Meuth, Brooke A, PA-C  OLANZapine (ZYPREXA) 15 MG tablet Take 15 mg by mouth at bedtime.   Yes [provider]  OLANZapine (ZYPREXA) 5 MG tablet Take 5 mg by mouth daily.   Yes [provider]  rosuvastatin (CRESTOR) 20 MG tablet Take 20 mg by mouth daily.   Yes [provider]    The patient is critically ill with multiple organ systems failure and requires high complexity decision making for assessment and support, frequent evaluation and titration of therapies, application of advanced monitoring technologies and extensive interpretation of multiple databases. Critical Care Time devoted to patient care services described in this note independent of  APP/resident time (if applicable)  is 31 minutes.   Myer Artis MD [facility] Personal pager: See Amion If unanswered, please page CCM On-call: #[phone]

## 2024-02-13 NOTE — Progress Notes (Signed)
 eLink Physician-Brief Progress Note Patient Name: Philip [name] DOB: 08-31-1991 MRN: [id number]   Date of Service  02/13/2024  HPI/Events of Note  33 year old male that initially presented with diabetic ketoacidosis complicated by encephalopathy requiring NG tube placement and insulin drip.  Transitioned off of insulin drip earlier today.  CBG now 403.  eICU Interventions  Received 10 units of long-acting insulin.    Administer 15 units of short acting insulin.  High risk of opening his gap again.  May need escalation of long-acting therapy   0259 -unclear why random glucose was checked but it was 514.  Confirmed at 445.  Will add an additional dose of aspart 15 units and administer Lantus 15 units.  Increase scheduled Lantus to 25 units.  0529 - Glucose >600, chack cmp/cbc am labs  0632 - glucose 609, potassium 3.5, phos 1.5, cret 3.47, GFR 23 -K-Phos, potassium chloride, and insulin administered.  10 units of glargine plus additional 10 units with daily glargine.  High risk of progressing to IV insulin regimen  Intervention Category Intermediate Interventions: Hyperglycemia - evaluation and treatment  Jazzmen Restivo 02/13/2024, 11:45 PM

## 2024-02-14 LAB — CBC
HCT: 29.1 % — ABNORMAL LOW (ref 39.0–52.0)
Hemoglobin: 9.5 g/dL — ABNORMAL LOW (ref 13.0–17.0)
MCH: 26.8 pg (ref 26.0–34.0)
MCHC: 32.6 g/dL (ref 30.0–36.0)
MCV: 82 fL (ref 80.0–100.0)
Platelets: 190 10*3/uL (ref 150–400)
RBC: 3.55 MIL/uL — ABNORMAL LOW (ref 4.22–5.81)
RDW: 12.7 % (ref 11.5–15.5)
WBC: 7.1 10*3/uL (ref 4.0–10.5)
nRBC: 0 % (ref 0.0–0.2)

## 2024-02-14 LAB — GLUCOSE, CAPILLARY
Glucose-Capillary: 445 mg/dL — ABNORMAL HIGH (ref 70–99)
Glucose-Capillary: 491 mg/dL — ABNORMAL HIGH (ref 70–99)
Glucose-Capillary: >600 mg/dL (ref 70–99)
Glucose-Capillary: 590 mg/dL (ref 70–99)
Glucose-Capillary: >600 mg/dL (ref 70–99)
Glucose-Capillary: >600 mg/dL (ref 70–99)
Glucose-Capillary: 588 mg/dL (ref 70–99)
Glucose-Capillary: >600 mg/dL (ref 70–99)
Glucose-Capillary: 502 mg/dL (ref 70–99)
Glucose-Capillary: 515 mg/dL (ref 70–99)
Glucose-Capillary: 427 mg/dL — ABNORMAL HIGH (ref 70–99)
Glucose-Capillary: 483 mg/dL — ABNORMAL HIGH (ref 70–99)
Glucose-Capillary: 382 mg/dL — ABNORMAL HIGH (ref 70–99)
Glucose-Capillary: 403 mg/dL — ABNORMAL HIGH (ref 70–99)
Glucose-Capillary: 345 mg/dL — ABNORMAL HIGH (ref 70–99)
Glucose-Capillary: 296 mg/dL — ABNORMAL HIGH (ref 70–99)
Glucose-Capillary: 229 mg/dL — ABNORMAL HIGH (ref 70–99)
Glucose-Capillary: 226 mg/dL — ABNORMAL HIGH (ref 70–99)
Glucose-Capillary: 227 mg/dL — ABNORMAL HIGH (ref 70–99)

## 2024-02-14 LAB — COMPREHENSIVE METABOLIC PANEL WITH GFR
ALT: 20 U/L (ref 0–44)
AST: 36 U/L (ref 15–41)
Albumin: 2.6 g/dL — ABNORMAL LOW (ref 3.5–5.0)
Alkaline Phosphatase: 72 U/L (ref 38–126)
Anion gap: 13 (ref 5–15)
BUN: 65 mg/dL — ABNORMAL HIGH (ref 6–20)
CO2: 14 mmol/L — ABNORMAL LOW (ref 22–32)
Calcium: 8.7 mg/dL — ABNORMAL LOW (ref 8.9–10.3)
Chloride: 108 mmol/L (ref 98–111)
Creatinine, Ser: 3.47 mg/dL — ABNORMAL HIGH (ref 0.61–1.24)
GFR, Estimated: 23 mL/min — ABNORMAL LOW (ref >60)
Glucose, Bld: 609 mg/dL (ref 70–99)
Potassium: 3.5 mmol/L (ref 3.5–5.1)
Sodium: 135 mmol/L (ref 135–145)
Total Bilirubin: 1 mg/dL (ref 0.0–1.2)
Total Protein: 5.3 g/dL — ABNORMAL LOW (ref 6.5–8.1)

## 2024-02-14 LAB — PHOSPHORUS
Phosphorus: 1.5 mg/dL — ABNORMAL LOW (ref 2.5–4.6)
Phosphorus: 1.2 mg/dL — ABNORMAL LOW (ref 2.5–4.6)

## 2024-02-14 LAB — GLUCOSE, RANDOM: Glucose, Bld: 514 mg/dL (ref 70–99)

## 2024-02-14 LAB — BASIC METABOLIC PANEL WITH GFR
Anion gap: 10 (ref 5–15)
BUN: 71 mg/dL — ABNORMAL HIGH (ref 6–20)
CO2: 16 mmol/L — ABNORMAL LOW (ref 22–32)
Calcium: 8.6 mg/dL — ABNORMAL LOW (ref 8.9–10.3)
Chloride: 109 mmol/L (ref 98–111)
Creatinine, Ser: 3.66 mg/dL — ABNORMAL HIGH (ref 0.61–1.24)
GFR, Estimated: 21 mL/min — ABNORMAL LOW (ref >60)
Glucose, Bld: 726 mg/dL (ref 70–99)
Potassium: 4.2 mmol/L (ref 3.5–5.1)
Sodium: 135 mmol/L (ref 135–145)

## 2024-02-14 LAB — MAGNESIUM
Magnesium: 2.6 mg/dL — ABNORMAL HIGH (ref 1.7–2.4)
Magnesium: 2.5 mg/dL — ABNORMAL HIGH (ref 1.7–2.4)

## 2024-02-14 MED ORDER — POTASSIUM CHLORIDE 10 MEQ/100ML IV SOLN
10.0000 meq | INTRAVENOUS | Status: AC
Start: 2024-02-14 — End: 2024-02-14
  Administered 2024-02-14 (×3): 10 meq via INTRAVENOUS
  Filled 2024-02-14 (×3): qty 100

## 2024-02-14 MED ORDER — ROSUVASTATIN CALCIUM 20 MG PO TABS: 20.0000 mg | ORAL_TABLET | Freq: Every day | ORAL | Status: DC

## 2024-02-14 MED ORDER — JEVITY 1.5 CAL/FIBER PO LIQD
1000.0000 mL | ORAL | Status: DC
  Administered 2024-02-14: 1000 mL
  Filled 2024-02-14 (×4): qty 1000

## 2024-02-14 MED ORDER — INSULIN ASPART 100 UNIT/ML IV SOLN
15.0000 [IU] | Freq: Once | INTRAVENOUS | Status: AC
  Administered 2024-02-14: 15 [IU] via INTRAVENOUS

## 2024-02-14 MED ORDER — OLANZAPINE 5 MG PO TABS
15.0000 mg | ORAL_TABLET | Freq: Every day | ORAL | Status: DC
  Administered 2024-02-14: 15 mg
  Filled 2024-02-14: qty 1

## 2024-02-14 MED ORDER — OLANZAPINE 5 MG PO TABS
5.0000 mg | ORAL_TABLET | Freq: Every day | ORAL | Status: DC
  Filled 2024-02-14: qty 1

## 2024-02-14 MED ORDER — INSULIN GLARGINE-YFGN 100 UNIT/ML ~~LOC~~ SOLN
35.0000 [IU] | Freq: Every day | SUBCUTANEOUS | Status: DC
Start: 2024-02-14 — End: 2024-02-14
  Administered 2024-02-14: 35 [IU] via SUBCUTANEOUS
  Filled 2024-02-14: qty 0.35

## 2024-02-14 MED ORDER — LAMOTRIGINE 25 MG PO TABS: 150.0000 mg | ORAL_TABLET | Freq: Every day | ORAL | Status: DC

## 2024-02-14 MED ORDER — INSULIN REGULAR(HUMAN) IN NACL 100-0.9 UT/100ML-% IV SOLN
INTRAVENOUS | Status: DC
  Administered 2024-02-14: 12 [IU]/h via INTRAVENOUS
  Administered 2024-02-14: 13 [IU]/h via INTRAVENOUS
  Administered 2024-02-15: 15 [IU]/h via INTRAVENOUS
  Filled 2024-02-14 (×2): qty 100

## 2024-02-14 MED ORDER — FREE WATER
200.0000 mL | Status: DC
  Administered 2024-02-14 – 2024-02-15 (×6): 200 mL

## 2024-02-14 MED ORDER — INSULIN GLARGINE-YFGN 100 UNIT/ML ~~LOC~~ SOLN
25.0000 [IU] | Freq: Every day | SUBCUTANEOUS | Status: DC
  Filled 2024-02-14: qty 0.25

## 2024-02-14 MED ORDER — POLYETHYLENE GLYCOL 3350 17 G PO PACK
17.0000 g | PACK | Freq: Every day | ORAL | Status: DC | PRN
  Filled 2024-02-14: qty 1

## 2024-02-14 MED ORDER — LACTATED RINGERS IV SOLN
INTRAVENOUS | Status: AC
Start: 2024-02-14 — End: 2024-02-15

## 2024-02-14 MED ORDER — INSULIN GLARGINE-YFGN 100 UNIT/ML ~~LOC~~ SOLN
10.0000 [IU] | Freq: Once | SUBCUTANEOUS | Status: AC
  Administered 2024-02-14: 10 [IU] via SUBCUTANEOUS
  Filled 2024-02-14: qty 0.1

## 2024-02-14 MED ORDER — LACTATED RINGERS IV BOLUS
500.0000 mL | Freq: Once | INTRAVENOUS | Status: AC
  Administered 2024-02-14: 500 mL via INTRAVENOUS

## 2024-02-14 MED ORDER — LISINOPRIL 10 MG PO TABS: 20.0000 mg | ORAL_TABLET | Freq: Every day | ORAL | Status: DC

## 2024-02-14 MED ORDER — POTASSIUM PHOSPHATES 15 MMOLE/5ML IV SOLN
30.0000 mmol | Freq: Once | INTRAVENOUS | Status: AC
  Administered 2024-02-14: 30 mmol via INTRAVENOUS
  Filled 2024-02-14: qty 10

## 2024-02-14 MED ORDER — INSULIN ASPART 100 UNIT/ML IV SOLN
15.0000 [IU] | Freq: Once | INTRAVENOUS | Status: AC
Start: 2024-02-14 — End: 2024-02-14
  Administered 2024-02-14: 15 [IU] via INTRAVENOUS

## 2024-02-14 MED ORDER — INSULIN GLARGINE-YFGN 100 UNIT/ML ~~LOC~~ SOLN
15.0000 [IU] | Freq: Once | SUBCUTANEOUS | Status: AC
  Administered 2024-02-14: 15 [IU] via SUBCUTANEOUS
  Filled 2024-02-14: qty 0.15

## 2024-02-14 MED ORDER — HYDROXYZINE HCL 25 MG PO TABS
50.0000 mg | ORAL_TABLET | Freq: Three times a day (TID) | ORAL | Status: DC | PRN
Start: 2024-02-14 — End: 2024-02-15

## 2024-02-14 NOTE — Inpatient Diabetes Management (Addendum)
 Inpatient Diabetes Program Recommendations  AACE/ADA: New Consensus Statement on Inpatient Glycemic Control (2015)  Target Ranges:  Prepandial:   less than 140 mg/dL      Peak postprandial:   less than 180 mg/dL (1-2 hours)      Critically ill patients:  140 - 180 mg/dL   Lab Results  Component Value Date   GLUCAP >600 (HH) 02/14/2024   HGBA1C 5.5 09/03/2014    Review of Glycemic Control  Diabetes history: DM2 Outpatient Diabetes medications: metformin  250 BID Current orders for Inpatient glycemic control: Novolog  0-15 Q4H, Lantus  35 daily  Inpatient Diabetes Program Recommendations:    Will likely need to restart IV insulin  per [employer]. Awaiting updated HgbA1C.   If not, will need TF coverage - Novolog  4 units Q4H (Hold if TFs held for any reason)  Continue to follow.  Thank you. Joni Net, RD, LDN, CDCES Inpatient Diabetes Coordinator [phone]  Addendum:   TFs on Hold d/t vomiting (had changed from Vital HP to Jevity 1.5 on 4/17 Need BMET, BHB Blood sugar 324 @ 2056  Follow. RV

## 2024-02-14 NOTE — TOC Progression Note (Signed)
 Transition of Care [facility]) - Progression Note    Patient Details  Name: Philip [name] MRN: [id number] Date of Birth: Sep 25, 1991  Transition of Care [facility]) CM/SW Contact  Levie Ream, RN Phone Number: 02/14/2024, 3:23 PM  Clinical Narrative:    Pt remains on insulin and precedex drips; TOC is following.   Expected Discharge Plan: Home/Self Care Barriers to Discharge: Continued Medical Work up  Expected Discharge Plan and Services                                               Social Determinants of Health (SDOH) Interventions SDOH Screenings   Food Insecurity: Patient Unable To Answer (02/12/2024)  Housing: Patient Unable To Answer (02/12/2024)  Transportation Needs: Patient Unable To Answer (02/12/2024)  Utilities: Patient Unable To Answer (02/12/2024)  Alcohol Screen: Medium Risk (12/19/2018)  Tobacco Use: High Risk (05/23/2022)    Readmission Risk Interventions     No data to display

## 2024-02-14 NOTE — Progress Notes (Signed)
 Initial Nutrition Assessment  DOCUMENTATION CODES:   Not applicable  INTERVENTION:  - New TF regimen via Cortrak (tip in duodenum): Jevity 1.5 at 60 ml/h (1440 ml per day) *Start at 13mL/hr and advance by 10mL Q12H Prosource TF20 60 ml daily Provides 2240 kcal, 112 gm protein, 1094 ml free water daily  - Increase FWF to 200mL Q4H. In addition to tube feeds will provide 2219mL/day  - Monitor magnesium, potassium, and phosphorus BID for at least 3 days, MD to replete as needed, as pt is at risk for refeeding syndrome given unknown intake PTA.  - Monitor weight trends.   NUTRITION DIAGNOSIS:   Inadequate oral intake related to inability to eat as evidenced by NPO status.  GOAL:   Patient will meet greater than or equal to 90% of their needs  MONITOR:   Labs, Weight trends, TF tolerance  REASON FOR ASSESSMENT:   Consult Enteral/tube feeding initiation and management  ASSESSMENT:   32 yo male with PMH DM2 and HTN who presented with AMS and admitted for DKA.  4/15 Admit; NPO 4/16 Cortrak placed (tip in duodenum); Vital HP initiated at 40  Patient sleeping at time of visit, did not awake to sound of voice. He is noted to be experiencing ongoing encephalopathy. No family at bedside.   Per chart review, no weight history within the past year to assess recent changes. Weight this admission has trended up.   Patient has been NPO since admit 2 days ago. Cortrak placed yesterday, tip in the distal duodenum per xray. Vital HP was started at 54mL/hr yesterday evening. No noted intolerances but blood sugars have been elevated.   Will adjust formula to Jevity 1.5 for more fiber to hopefully aid in better blood sugar control. Will increase slowly as phosphorus low yesterday and this AM which could indicate possible refeeding.  Will also plan to increase free water flushes due to increasing fiber content and no BM since admit. Discussed plan with CCM MD and RN.   Medications  reviewed and include: Q6H FWF Precedex  Labs reviewed:  Creatinine 3.47 Phosphorus 1.5 Magnesium 2.6   NUTRITION - FOCUSED PHYSICAL EXAM:  Flowsheet Row Most Recent Value  Orbital Region No depletion  Upper Arm Region No depletion  Thoracic and Lumbar Region No depletion  Buccal Region No depletion  Temple Region No depletion  Clavicle Bone Region No depletion  Clavicle and Acromion Bone Region No depletion  Scapular Bone Region Unable to assess  Dorsal Hand No depletion  Patellar Region No depletion  Anterior Thigh Region No depletion  Posterior Calf Region No depletion  Edema (RD Assessment) None  Hair Reviewed  Eyes Unable to assess  Mouth Unable to assess  Skin Reviewed  Nails Reviewed       Diet Order:   Diet Order             Diet NPO time specified  Diet effective now                   EDUCATION NEEDS:  Not appropriate for education at this time  Skin:  Skin Assessment: Reviewed RN Assessment  Last BM:  PTA  Height:  Ht Readings from Last 1 Encounters:  02/12/24 6\' 1"  (1.854 m)   Weight:  Wt Readings from Last 1 Encounters:  02/14/24 98.9 kg   Ideal Body Weight:  83.64 kg  BMI:  Body mass index is 28.77 kg/m.  Estimated Nutritional Needs:  Kcal:  2100-2300 kcals Protein:  100-120 grams Fluid:  >/= 2.1L   Scheryl Cushing RD, LDN Contact via Secure Chat.

## 2024-02-15 ENCOUNTER — Inpatient Hospital Stay (HOSPITAL_COMMUNITY): Payer: MEDICAID

## 2024-02-15 DIAGNOSIS — G9341 Metabolic encephalopathy: Secondary | ICD-10-CM | POA: Diagnosis not present

## 2024-02-15 DIAGNOSIS — E1110 Type 2 diabetes mellitus with ketoacidosis without coma: Secondary | ICD-10-CM | POA: Diagnosis not present

## 2024-02-15 DIAGNOSIS — N179 Acute kidney failure, unspecified: Secondary | ICD-10-CM | POA: Diagnosis not present

## 2024-02-15 DIAGNOSIS — E876 Hypokalemia: Secondary | ICD-10-CM | POA: Diagnosis not present

## 2024-02-15 LAB — PHOSPHORUS
Phosphorus: 2.3 mg/dL — ABNORMAL LOW (ref 2.5–4.6)
Phosphorus: 2.2 mg/dL — ABNORMAL LOW (ref 2.5–4.6)

## 2024-02-15 LAB — MAGNESIUM
Magnesium: 2.5 mg/dL — ABNORMAL HIGH (ref 1.7–2.4)
Magnesium: 2.1 mg/dL (ref 1.7–2.4)

## 2024-02-15 LAB — BASIC METABOLIC PANEL WITH GFR
Anion gap: 10 (ref 5–15)
BUN: 63 mg/dL — ABNORMAL HIGH (ref 6–20)
CO2: 18 mmol/L — ABNORMAL LOW (ref 22–32)
Calcium: 9.1 mg/dL (ref 8.9–10.3)
Chloride: 118 mmol/L — ABNORMAL HIGH (ref 98–111)
Creatinine, Ser: 3.35 mg/dL — ABNORMAL HIGH (ref 0.61–1.24)
GFR, Estimated: 24 mL/min — ABNORMAL LOW (ref >60)
Glucose, Bld: 220 mg/dL — ABNORMAL HIGH (ref 70–99)
Potassium: 3.5 mmol/L (ref 3.5–5.1)
Sodium: 146 mmol/L — ABNORMAL HIGH (ref 135–145)

## 2024-02-15 LAB — GLUCOSE, CAPILLARY
Glucose-Capillary: 166 mg/dL — ABNORMAL HIGH (ref 70–99)
Glucose-Capillary: 186 mg/dL — ABNORMAL HIGH (ref 70–99)
Glucose-Capillary: 187 mg/dL — ABNORMAL HIGH (ref 70–99)
Glucose-Capillary: 191 mg/dL — ABNORMAL HIGH (ref 70–99)
Glucose-Capillary: 193 mg/dL — ABNORMAL HIGH (ref 70–99)
Glucose-Capillary: 195 mg/dL — ABNORMAL HIGH (ref 70–99)
Glucose-Capillary: 211 mg/dL — ABNORMAL HIGH (ref 70–99)
Glucose-Capillary: 212 mg/dL — ABNORMAL HIGH (ref 70–99)
Glucose-Capillary: 212 mg/dL — ABNORMAL HIGH (ref 70–99)
Glucose-Capillary: 218 mg/dL — ABNORMAL HIGH (ref 70–99)
Glucose-Capillary: 218 mg/dL — ABNORMAL HIGH (ref 70–99)
Glucose-Capillary: 219 mg/dL — ABNORMAL HIGH (ref 70–99)
Glucose-Capillary: 280 mg/dL — ABNORMAL HIGH (ref 70–99)
Glucose-Capillary: 324 mg/dL — ABNORMAL HIGH (ref 70–99)

## 2024-02-15 MED ORDER — ROSUVASTATIN CALCIUM 10 MG PO TABS
20.0000 mg | ORAL_TABLET | Freq: Every day | ORAL | Status: DC
  Administered 2024-02-15 – 2024-02-18 (×4): 20 mg via ORAL
  Filled 2024-02-15 (×2): qty 1
  Filled 2024-02-15 (×2): qty 2

## 2024-02-15 MED ORDER — OLANZAPINE 5 MG PO TABS
15.0000 mg | ORAL_TABLET | Freq: Every day | ORAL | Status: DC
  Administered 2024-02-15 – 2024-02-17 (×3): 15 mg via ORAL
  Filled 2024-02-15 (×4): qty 1

## 2024-02-15 MED ORDER — SODIUM CHLORIDE 0.9 % IV SOLN
25.0000 mg | Freq: Four times a day (QID) | INTRAVENOUS | Status: AC
  Administered 2024-02-15: 25 mg via INTRAVENOUS
  Filled 2024-02-15: qty 25

## 2024-02-15 MED ORDER — METOCLOPRAMIDE HCL 5 MG/ML IJ SOLN
10.0000 mg | Freq: Three times a day (TID) | INTRAMUSCULAR | Status: DC
  Administered 2024-02-15 – 2024-02-16 (×3): 10 mg via INTRAVENOUS
  Filled 2024-02-15 (×3): qty 2

## 2024-02-15 MED ORDER — SENNOSIDES 8.8 MG/5ML PO SYRP
10.0000 mL | ORAL_SOLUTION | Freq: Once | ORAL | Status: AC
  Administered 2024-02-15: 10 mL
  Filled 2024-02-15: qty 10

## 2024-02-15 MED ORDER — INSULIN ASPART 100 UNIT/ML IJ SOLN
0.0000 [IU] | INTRAMUSCULAR | Status: DC
  Administered 2024-02-15: 15 [IU] via SUBCUTANEOUS
  Administered 2024-02-15: 11 [IU] via SUBCUTANEOUS
  Administered 2024-02-15: 7 [IU] via SUBCUTANEOUS
  Administered 2024-02-16: 11 [IU] via SUBCUTANEOUS
  Administered 2024-02-16 (×4): 7 [IU] via SUBCUTANEOUS
  Administered 2024-02-16: 15 [IU] via SUBCUTANEOUS
  Administered 2024-02-17: 3 [IU] via SUBCUTANEOUS
  Administered 2024-02-17 (×3): 4 [IU] via SUBCUTANEOUS
  Administered 2024-02-17: 15 [IU] via SUBCUTANEOUS
  Administered 2024-02-18: 4 [IU] via SUBCUTANEOUS

## 2024-02-15 MED ORDER — OLANZAPINE 5 MG PO TABS
5.0000 mg | ORAL_TABLET | Freq: Every day | ORAL | Status: DC
  Administered 2024-02-15 – 2024-02-18 (×4): 5 mg via ORAL
  Filled 2024-02-15 (×4): qty 1

## 2024-02-15 MED ORDER — DEXTROSE IN LACTATED RINGERS 5 % IV SOLN
INTRAVENOUS | Status: DC
Start: 2024-02-15 — End: 2024-02-16

## 2024-02-15 MED ORDER — POLYETHYLENE GLYCOL 3350 17 G PO PACK
17.0000 g | PACK | Freq: Every day | ORAL | Status: DC | PRN
  Administered 2024-02-15 – 2024-02-16 (×2): 17 g via ORAL
  Filled 2024-02-15: qty 1

## 2024-02-15 MED ORDER — METOCLOPRAMIDE HCL 5 MG/ML IJ SOLN
INTRAMUSCULAR | Status: AC
  Administered 2024-02-15: 10 mg via INTRAVENOUS
  Filled 2024-02-15: qty 2

## 2024-02-15 MED ORDER — SODIUM CHLORIDE 0.45 % IV BOLUS
1000.0000 mL | Freq: Once | INTRAVENOUS | Status: AC
  Administered 2024-02-15: 1000 mL via INTRAVENOUS

## 2024-02-15 MED ORDER — LAMOTRIGINE 25 MG PO TABS
150.0000 mg | ORAL_TABLET | Freq: Every day | ORAL | Status: DC
Start: 2024-02-15 — End: 2024-02-18
  Administered 2024-02-15 – 2024-02-18 (×4): 150 mg via ORAL
  Filled 2024-02-15 (×4): qty 2

## 2024-02-15 MED ORDER — LABETALOL HCL 5 MG/ML IV SOLN
10.0000 mg | INTRAVENOUS | Status: DC | PRN
  Administered 2024-02-15 – 2024-02-16 (×3): 10 mg via INTRAVENOUS
  Filled 2024-02-15 (×3): qty 4

## 2024-02-15 MED ORDER — CLONAZEPAM 0.5 MG PO TABS
0.5000 mg | ORAL_TABLET | Freq: Two times a day (BID) | ORAL | Status: DC
  Administered 2024-02-15 – 2024-02-18 (×7): 0.5 mg
  Filled 2024-02-15 (×7): qty 1

## 2024-02-15 MED ORDER — HYDROXYZINE HCL 25 MG PO TABS
50.0000 mg | ORAL_TABLET | Freq: Three times a day (TID) | ORAL | Status: DC | PRN
  Administered 2024-02-17: 50 mg via ORAL
  Filled 2024-02-15: qty 2

## 2024-02-15 MED ORDER — INSULIN GLARGINE-YFGN 100 UNIT/ML ~~LOC~~ SOLN
30.0000 [IU] | Freq: Two times a day (BID) | SUBCUTANEOUS | Status: DC
  Administered 2024-02-15 – 2024-02-17 (×5): 30 [IU] via SUBCUTANEOUS
  Filled 2024-02-15 (×6): qty 0.3

## 2024-02-15 MED ORDER — LISINOPRIL 20 MG PO TABS
20.0000 mg | ORAL_TABLET | Freq: Every day | ORAL | Status: DC
  Administered 2024-02-15 – 2024-02-18 (×4): 20 mg via ORAL
  Filled 2024-02-15: qty 2
  Filled 2024-02-15 (×2): qty 1
  Filled 2024-02-15: qty 2

## 2024-02-15 MED ORDER — INSULIN GLARGINE-YFGN 100 UNIT/ML ~~LOC~~ SOLN
20.0000 [IU] | Freq: Two times a day (BID) | SUBCUTANEOUS | Status: DC
  Filled 2024-02-15: qty 0.2

## 2024-02-15 NOTE — Progress Notes (Signed)
 Pt had second episode of vomiting. Dr. Gaynell Keeler notified and phenergan  ordered. Tube feeds still on hold.

## 2024-02-15 NOTE — Progress Notes (Signed)
 eLink Physician-Brief Progress Note Patient Name: DEMANI MCBRIEN DOB: January 14, 1991 MRN: [id number]   Date of Service  02/15/2024  HPI/Events of Note  Persistent agitation and encephalopathy currently in restraints, overall improved  eICU Interventions  Continue restraints as needed for safety     Intervention Category Minor Interventions: Agitation / anxiety - evaluation and management  Marykay Mccleod 02/15/2024, 1:13 AM

## 2024-02-15 NOTE — Progress Notes (Signed)
 eLink Physician-Brief Progress Note Patient Name: Philip [name] DOB: 1991-07-18 MRN: [id number]   Date of Service  02/15/2024  HPI/Events of Note  Hypertensive and tachycardic, receiving lisinopril , but wondering if this Vomited up earlier  eICU Interventions  Labetalol  as needed     Intervention Category Minor Interventions: Routine modifications to care plan (e.g. PRN medications for pain, fever)  Hanish Laraia 02/15/2024, 11:24 PM

## 2024-02-15 NOTE — Progress Notes (Signed)
 NAME:  Philip [name], MRN:  [id number], DOB:  03-07-1991, LOS: 4 ADMISSION DATE:  02/11/2024, CONSULTATION DATE: 02/11/2024 REFERRING MD: ED physician, CHIEF COMPLAINT: DKA  History of Present Illness:  33 yo male presented with ams. Pt was last seen normal at 1930 02/10/24. His family states that he has been in a manic state for some time and has not been reliably taking his home medications for approximately a week. Family also endorses that he has not likely been taking his metformin  for much longer as he states to family it makes him sick.    Over the past week he has been reporting to his sister ( whom he lives with) he has been having stomach discomfort and that he has been nauseous as well as vomiting. He denied fever, chills, cp, dizziness, diarrhea. Sister is unable to comment on these observations.    Upon presentation ot was noted to be lethargic with kussmaul respirations. He is unable to answer simple questions at this time, he is moving spontaneously but otherwise just moans continuously when stimulated.  Pertinent  Medical History  Hypertension Diabetes  Significant Hospital Events: Including procedures, antibiotic start and stop dates in addition to other pertinent events   4/14-admitted to ICU 4/18-on insulin  drip  Interim History / Subjective:  Remains on Precedex  Easily arousable More appropriate today  Objective   Blood pressure (!) 156/90, pulse 87, temperature (!) 100.4 F (38 C), temperature source Axillary, resp. rate (!) 21, height 6' 1 (1.854 m), weight 101 kg, SpO2 98%.        Intake/Output Summary (Last 24 hours) at 02/15/2024 0811 Last data filed at 02/15/2024 0600 Gross per 24 hour  Intake 4265.41 ml  Output 1310 ml  Net 2955.41 ml   Filed Weights   02/13/24 0500 02/14/24 0500 02/15/24 0500  Weight: 94.5 kg 98.9 kg 101 kg    Examination: General: Young gentleman, arousable HENT: Dry oral mucosa Lungs: Clear breath sounds Cardiovascular:  S1-S2 appreciated Abdomen: Soft, bowel sounds appreciated Extremities: No edema, no clubbing Neuro: Remains sedated GU: Fair output  I reviewed nursing notes, Consultant notes, hospitalist notes, last 24 h vitals and pain scores, last 48 h intake and output, last 24 h labs and trends, and last 24 h imaging results.  Resolved Hospital Problem list     Assessment & Plan:   Diabetic ketoacidosis - Will transition off insulin  pump today 02/15/2024 - Started on Semglee  - Anion gap remains closed  Metabolic encephalopathy -A little more redirectable today - Requiring Precedex  - Requiring restraints - Resumed his home doses of Zyprexa  - According to sibling, patient was off his medications for about a week prior to coming into the hospital -wean precedex   Acute kidney injury - Continue fluid resuscitation - Avoid nephrotoxic medications -Maintain renal perfusion  History of bipolar disorder - Resumed Zyprexa   Continue to replete electrolytes  Best Practice (right click and Reselect all SmartList Selections daily)   Diet/type: On tube feeds DVT prophylaxis SCD Pressure ulcer(s):  GI prophylaxis: N/A Lines: N/A Foley:  N/A Code Status:  full code Last date of multidisciplinary goals of care discussion [pending] Updated patients sister  Labs   CBC: Recent Labs  Lab 02/11/24 2139 02/13/24 0303 02/14/24 0539  WBC 16.6* 11.1* 7.1  NEUTROABS 14.1* 8.5*  --   HGB 13.7 10.8* 9.5*  HCT 42.0 33.2* 29.1*  MCV 82.5 80.8 82.0  PLT 413* 223 190    Basic Metabolic Panel: Recent Labs  Lab 02/12/24  1618 02/13/24 0303 02/13/24 1657 02/14/24 0146 02/14/24 0539 02/14/24 1154 02/14/24 1712 02/15/24 0455  NA 141 141  --   --  135 135  --  146*  K 3.1* 3.1*  --   --  3.5 4.2  --  3.5  CL 107 110  --   --  108 109  --  118*  CO2 16* 18*  --   --  14* 16*  --  18*  GLUCOSE 236* 245*  --  514* 609* 726*  --  220*  BUN 67* 66*  --   --  65* 71*  --  63*  CREATININE 2.45*  2.61*  --   --  3.47* 3.66*  --  3.35*  CALCIUM  10.2 9.7  --   --  8.7* 8.6*  --  9.1  MG  --   --  2.9*  --  2.6*  --  2.5* 2.5*  PHOS  --   --  <1.0*  --  1.5*  --  1.2* 2.3*   GFR: Estimated Creatinine Clearance: 38.8 mL/min (A) (by C-G formula based on SCr of 3.35 mg/dL (H)). Recent Labs  Lab 02/11/24 2139 02/12/24 1155 02/13/24 0303 02/14/24 0539  WBC 16.6*  --  11.1* 7.1  LATICACIDVEN 4.6* 1.5  --   --     Liver Function Tests: Recent Labs  Lab 02/11/24 2139 02/13/24 0303 02/14/24 0539  AST 21 63* 36  ALT 22 20 20   ALKPHOS 113 72 72  BILITOT 1.9* 1.4* 1.0  PROT 8.5* 6.3* 5.3*  ALBUMIN  4.7 3.3* 2.6*   No results for input(s): LIPASE, AMYLASE in the last 168 hours. No results for input(s): AMMONIA in the last 168 hours.  ABG    Component Value Date/Time   PHART 7.477 (H) 05/27/2022 0508   PCO2ART 35.2 05/27/2022 0508   PO2ART 178 (H) 05/27/2022 0508   HCO3 19.8 (L) 02/12/2024 1618   TCO2 27 05/27/2022 0508   ACIDBASEDEF 4.0 (H) 02/12/2024 1618   O2SAT 96 02/12/2024 1618     Coagulation Profile: No results for input(s): INR, PROTIME in the last 168 hours.  Cardiac Enzymes: No results for input(s): CKTOTAL, CKMB, CKMBINDEX, TROPONINI in the last 168 hours.  HbA1C: Hgb A1c MFr Bld  Date/Time Value Ref Range Status  09/03/2014 06:24 AM 5.5 <5.7 % Final    Comment:    (NOTE)                                                                       According to the ADA Clinical Practice Recommendations for 2011, when HbA1c is used as a screening test:  >=6.5%   Diagnostic of Diabetes Mellitus           (if abnormal result is confirmed) 5.7-6.4%   Increased risk of developing Diabetes Mellitus References:Diagnosis and Classification of Diabetes Mellitus,Diabetes Care,2011,34(Suppl 1):S62-S69 and Standards of Medical Care in         Diabetes - 2011,Diabetes Care,2011,34 (Suppl 1):S11-S61.     CBG: Recent Labs  Lab 02/15/24 0312  02/15/24 0414 02/15/24 0518 02/15/24 0620 02/15/24 0737  GLUCAP 191* 193* 187* 195* 211*    Review of Systems:   Sedated  Past Medical History:  He,  has a past medical history of ADHD (attention deficit hyperactivity disorder), Asthma, Bipolar affective disorder (HCC), and Schizophrenia (HCC).   Surgical History:   Past Surgical History:  Procedure Laterality Date   NO PAST SURGERIES       Social History:   reports that he has been smoking cigarettes. He has a 6 pack-year smoking history. He has never used smokeless tobacco. He reports current alcohol use of about 1.0 standard drink of alcohol per week. He reports current drug use. Drug: Marijuana.   Family History:  His family history includes Hypertension in his mother.   Allergies Allergies  Allergen Reactions   Pork-Derived Products Other (See Comments)    Does not eat it   Shrimp [Shellfish Allergy] Anaphylaxis    The patient is critically ill with multiple organ systems failure and requires high complexity decision making for assessment and support, frequent evaluation and titration of therapies, application of advanced monitoring technologies and extensive interpretation of multiple databases. Critical Care Time devoted to patient care services described in this note independent of APP/resident time (if applicable)  is 32 minutes.   Jennet Epley MD [facility] Personal pager: See Amion If unanswered, please page CCM On-call: #[phone]

## 2024-02-15 NOTE — Progress Notes (Signed)
 Informed about vomiting  Tube feeds will be held Get KUB  Already started on Reglan 

## 2024-02-16 ENCOUNTER — Other Ambulatory Visit: Payer: Self-pay

## 2024-02-16 ENCOUNTER — Encounter (HOSPITAL_COMMUNITY): Payer: Self-pay | Admitting: Critical Care Medicine

## 2024-02-16 ENCOUNTER — Inpatient Hospital Stay (HOSPITAL_COMMUNITY): Payer: MEDICAID

## 2024-02-16 DIAGNOSIS — R1110 Vomiting, unspecified: Secondary | ICD-10-CM

## 2024-02-16 DIAGNOSIS — G9341 Metabolic encephalopathy: Secondary | ICD-10-CM | POA: Diagnosis not present

## 2024-02-16 DIAGNOSIS — E876 Hypokalemia: Secondary | ICD-10-CM | POA: Diagnosis not present

## 2024-02-16 DIAGNOSIS — E1110 Type 2 diabetes mellitus with ketoacidosis without coma: Secondary | ICD-10-CM | POA: Diagnosis not present

## 2024-02-16 DIAGNOSIS — N179 Acute kidney failure, unspecified: Secondary | ICD-10-CM | POA: Diagnosis not present

## 2024-02-16 LAB — BASIC METABOLIC PANEL WITH GFR
Anion gap: 8 (ref 5–15)
BUN: 53 mg/dL — ABNORMAL HIGH (ref 6–20)
CO2: 22 mmol/L (ref 22–32)
Calcium: 8.8 mg/dL — ABNORMAL LOW (ref 8.9–10.3)
Chloride: 117 mmol/L — ABNORMAL HIGH (ref 98–111)
Creatinine, Ser: 2.92 mg/dL — ABNORMAL HIGH (ref 0.61–1.24)
GFR, Estimated: 28 mL/min — ABNORMAL LOW (ref >60)
Glucose, Bld: 269 mg/dL — ABNORMAL HIGH (ref 70–99)
Potassium: 3.3 mmol/L — ABNORMAL LOW (ref 3.5–5.1)
Sodium: 147 mmol/L — ABNORMAL HIGH (ref 135–145)

## 2024-02-16 LAB — GLUCOSE, CAPILLARY
Glucose-Capillary: 231 mg/dL — ABNORMAL HIGH (ref 70–99)
Glucose-Capillary: 232 mg/dL — ABNORMAL HIGH (ref 70–99)
Glucose-Capillary: 234 mg/dL — ABNORMAL HIGH (ref 70–99)
Glucose-Capillary: 243 mg/dL — ABNORMAL HIGH (ref 70–99)
Glucose-Capillary: 297 mg/dL — ABNORMAL HIGH (ref 70–99)
Glucose-Capillary: 341 mg/dL — ABNORMAL HIGH (ref 70–99)

## 2024-02-16 LAB — PHOSPHORUS
Phosphorus: 2.1 mg/dL — ABNORMAL LOW (ref 2.5–4.6)
Phosphorus: 2.2 mg/dL — ABNORMAL LOW (ref 2.5–4.6)

## 2024-02-16 LAB — MAGNESIUM
Magnesium: 2.2 mg/dL (ref 1.7–2.4)
Magnesium: 2.1 mg/dL (ref 1.7–2.4)

## 2024-02-16 MED ORDER — LABETALOL HCL 5 MG/ML IV SOLN: 20.0000 mg | INTRAVENOUS | Status: DC | PRN

## 2024-02-16 MED ORDER — METOCLOPRAMIDE HCL 10 MG PO TABS
10.0000 mg | ORAL_TABLET | Freq: Three times a day (TID) | ORAL | Status: DC
  Administered 2024-02-16 – 2024-02-18 (×7): 10 mg via ORAL
  Filled 2024-02-16 (×7): qty 1

## 2024-02-16 MED ORDER — METOPROLOL TARTRATE 50 MG PO TABS
100.0000 mg | ORAL_TABLET | Freq: Two times a day (BID) | ORAL | Status: DC
  Administered 2024-02-16 – 2024-02-18 (×5): 100 mg via ORAL
  Filled 2024-02-16 (×3): qty 2
  Filled 2024-02-16: qty 4
  Filled 2024-02-16: qty 2

## 2024-02-16 MED ORDER — SENNOSIDES-DOCUSATE SODIUM 8.6-50 MG PO TABS
2.0000 | ORAL_TABLET | Freq: Once | ORAL | Status: AC
  Administered 2024-02-16: 2 via ORAL
  Filled 2024-02-16: qty 2

## 2024-02-16 NOTE — Inpatient Diabetes Management (Signed)
 Inpatient Diabetes Program Recommendations  AACE/ADA: New Consensus Statement on Inpatient Glycemic Control   Target Ranges:  Prepandial:   less than 140 mg/dL      Peak postprandial:   less than 180 mg/dL (1-2 hours)      Critically ill patients:  140 - 180 mg/dL    Latest Reference Range & Units 02/16/24 00:11 02/16/24 04:48  Glucose-Capillary 70 - 99 mg/dL 604 (H) 540 (H)    Latest Reference Range & Units 02/15/24 07:37 02/15/24 08:38 02/15/24 09:39 02/15/24 10:45 02/15/24 11:23 02/15/24 16:06 02/15/24 20:56  Glucose-Capillary 70 - 99 mg/dL 981 (H) 191 (H) 478 (H) 218 (H) 218 (H) 280 (H) 324 (H)   Review of Glycemic Control  Diabetes history: DM2 Outpatient Diabetes medications: Metformin  250 mg BID Current orders for Inpatient glycemic control: Semglee  30 units BID, Novolog  0-20 units Q4H  Inpatient Diabetes Program Recommendations:    Insulin : Please consider increasing Semglee  to 40 units BID.  NOTE: Noted consult for Diabetes Coordinator. Diabetes Coordinator is not on campus over the weekend but available by pager from 8am to 5pm for questions or concerns. Chart reviewed. Noted patient was transitioned from IV to SQ insulin  yesterday morning and tube feedings on hold.   Thanks, Beacher Limerick, RN, MSN, CDCES Diabetes Coordinator Inpatient Diabetes Program [phone] (Team Pager from 8am to 5pm)

## 2024-02-16 NOTE — Plan of Care (Signed)
  Problem: Safety: Goal: Non-violent Restraint(s) Outcome: Progressing   Problem: Education: Goal: Knowledge of General Education information will improve Description: Including pain rating scale, medication(s)/side effects and non-pharmacologic comfort measures Outcome: Progressing   Problem: Health Behavior/Discharge Planning: Goal: Ability to manage health-related needs will improve Outcome: Progressing   Problem: Clinical Measurements: Goal: Ability to maintain clinical measurements within normal limits will improve Outcome: Progressing Goal: Will remain free from infection Outcome: Progressing Goal: Diagnostic test results will improve Outcome: Progressing Goal: Respiratory complications will improve Outcome: Progressing Goal: Cardiovascular complication will be avoided Outcome: Progressing   Problem: Activity: Goal: Risk for activity intolerance will decrease Outcome: Progressing   Problem: Nutrition: Goal: Adequate nutrition will be maintained Outcome: Progressing   Problem: Coping: Goal: Level of anxiety will decrease Outcome: Progressing   Problem: Elimination: Goal: Will not experience complications related to bowel motility Outcome: Progressing Goal: Will not experience complications related to urinary retention Outcome: Progressing   Problem: Pain Managment: Goal: General experience of comfort will improve and/or be controlled Outcome: Progressing   Problem: Safety: Goal: Ability to remain free from injury will improve Outcome: Progressing   Problem: Skin Integrity: Goal: Risk for impaired skin integrity will decrease Outcome: Progressing   Problem: Education: Goal: Ability to describe self-care measures that may prevent or decrease complications (Diabetes Survival Skills Education) will improve Outcome: Progressing Goal: Individualized Educational Video(s) Outcome: Progressing   Problem: Coping: Goal: Ability to adjust to condition or change  in health will improve Outcome: Progressing   Problem: Fluid Volume: Goal: Ability to maintain a balanced intake and output will improve Outcome: Progressing   Problem: Health Behavior/Discharge Planning: Goal: Ability to identify and utilize available resources and services will improve Outcome: Progressing Goal: Ability to manage health-related needs will improve Outcome: Progressing   Problem: Metabolic: Goal: Ability to maintain appropriate glucose levels will improve Outcome: Progressing   Problem: Nutritional: Goal: Maintenance of adequate nutrition will improve Outcome: Progressing Goal: Progress toward achieving an optimal weight will improve Outcome: Progressing   Problem: Skin Integrity: Goal: Risk for impaired skin integrity will decrease Outcome: Progressing   Problem: Tissue Perfusion: Goal: Adequacy of tissue perfusion will improve Outcome: Progressing   Problem: Education: Goal: Ability to describe self-care measures that may prevent or decrease complications (Diabetes Survival Skills Education) will improve Outcome: Progressing Goal: Individualized Educational Video(s) Outcome: Progressing   Problem: Cardiac: Goal: Ability to maintain an adequate cardiac output will improve Outcome: Progressing   Problem: Health Behavior/Discharge Planning: Goal: Ability to identify and utilize available resources and services will improve Outcome: Progressing Goal: Ability to manage health-related needs will improve Outcome: Progressing   Problem: Fluid Volume: Goal: Ability to achieve a balanced intake and output will improve Outcome: Progressing   Problem: Metabolic: Goal: Ability to maintain appropriate glucose levels will improve Outcome: Progressing   Problem: Nutritional: Goal: Maintenance of adequate nutrition will improve Outcome: Progressing Goal: Maintenance of adequate weight for body size and type will improve Outcome: Progressing   Problem:  Respiratory: Goal: Will regain and/or maintain adequate ventilation Outcome: Progressing   Problem: Urinary Elimination: Goal: Ability to achieve and maintain adequate renal perfusion and functioning will improve Outcome: Progressing

## 2024-02-16 NOTE — Progress Notes (Signed)
 NAME:  Philip [name], MRN:  [id number], DOB:  05-03-1991, LOS: 5 ADMISSION DATE:  02/11/2024, CONSULTATION DATE: 02/11/2024 REFERRING MD: ED physician, CHIEF COMPLAINT: DKA  History of Present Illness:  33 yo male presented with ams. Pt was last seen normal at 1930 02/10/24. His family states that he has been in a manic state for some time and has not been reliably taking his home medications for approximately a week. Family also endorses that he has not likely been taking his metformin  for much longer as he states to family it makes him sick.    Over the past week he has been reporting to his sister ( whom he lives with) he has been having stomach discomfort and that he has been nauseous as well as vomiting. He denied fever, chills, cp, dizziness, diarrhea. Sister is unable to comment on these observations.    Upon presentation ot was noted to be lethargic with kussmaul respirations. He is unable to answer simple questions at this time, he is moving spontaneously but otherwise just moans continuously when stimulated.  Pertinent  Medical History  Hypertension Diabetes  Significant Hospital Events: Including procedures, antibiotic start and stop dates in addition to other pertinent events   4/14-admitted to ICU 4/18-on insulin  drip  Interim History / Subjective:  Off Precedex  Awake alert interactive Cooperative Objective   Blood pressure (!) 163/104, pulse (!) 111, temperature 98.9 F (37.2 C), temperature source Axillary, resp. rate 19, height 6\' 1"  (1.854 m), weight 101.3 kg, SpO2 96%.        Intake/Output Summary (Last 24 hours) at 02/16/2024 0729 Last data filed at 02/16/2024 0544 Gross per 24 hour  Intake 1838.29 ml  Output 2120 ml  Net -281.71 ml   Filed Weights   02/14/24 0500 02/15/24 0500 02/16/24 0500  Weight: 98.9 kg 101 kg 101.3 kg    Examination: General: Young gentleman HENT: Dry oral mucosa, cortrak in place Lungs: Clear breath sounds Cardiovascular: S1-S2  appreciated Abdomen: Soft, bowel sounds appreciated Extremities: No edema, no clubbing Neuro: Awake alert interactive GU: Fair output  I reviewed nursing notes, Consultant notes, hospitalist notes, last 24 h vitals and pain scores, last 48 h intake and output, last 24 h labs and trends, and last 24 h imaging results.  Resolved Hospital Problem list     Assessment & Plan:   Diabetic ketoacidosis - On Semglee  -Anion gap remains closed -Still with elevated sugars  Metabolic encephalopathy - Very redirectable today - Will try and discontinue restraints - Resumed home meds Zyprexa  - Was off his medications for about a week prior to coming into the hospital  Acute kidney injury - BUN/creatinine still elevated but improving - Continue fluid resuscitation -Ensure to maintain renal perfusion  History of bipolar disorder - Continue Zyprexa   Continue to replete electrolytes  Vomiting - On Reglan , Phenergan   Tube feeds have been held because of his vomiting - Need to be cautious about escalating insulin  coverage until we are sure he is able to taking orally  If able to swallow well, will DC cortrak This will also assist in being able to discontinue restraints Patient still very impulsive and may pull out cortrak without restraints  Best Practice (right click and "Reselect all SmartList Selections" daily)   Diet/type: On tube feeds-on hold at present DVT prophylaxis SCD Pressure ulcer(s):  GI prophylaxis: N/A Lines: N/A Foley:  N/A Code Status:  full code Last date of multidisciplinary goals of care discussion [pending] Updated patients sister  Labs  CBC: Recent Labs  Lab 02/11/24 2139 02/13/24 0303 02/14/24 0539  WBC 16.6* 11.1* 7.1  NEUTROABS 14.1* 8.5*  --   HGB 13.7 10.8* 9.5*  HCT 42.0 33.2* 29.1*  MCV 82.5 80.8 82.0  PLT 413* 223 190    Basic Metabolic Panel: Recent Labs  Lab 02/13/24 0303 02/13/24 1657 02/14/24 0146 02/14/24 0539 02/14/24 1154  02/14/24 1712 02/15/24 0455 02/15/24 1741 02/16/24 0443  NA 141  --   --  135 135  --  146*  --  147*  K 3.1*  --   --  3.5 4.2  --  3.5  --  3.3*  CL 110  --   --  108 109  --  118*  --  117*  CO2 18*  --   --  14* 16*  --  18*  --  22  GLUCOSE 245*  --  514* 609* 726*  --  220*  --  269*  BUN 66*  --   --  65* 71*  --  63*  --  53*  CREATININE 2.61*  --   --  3.47* 3.66*  --  3.35*  --  2.92*  CALCIUM  9.7  --   --  8.7* 8.6*  --  9.1  --  8.8*  MG  --    < >  --  2.6*  --  2.5* 2.5* 2.1 2.2  PHOS  --    < >  --  1.5*  --  1.2* 2.3* 2.2* 2.1*   < > = values in this interval not displayed.   GFR: Estimated Creatinine Clearance: 44.6 mL/min (A) (by C-G formula based on SCr of 2.92 mg/dL (H)). Recent Labs  Lab 02/11/24 2139 02/12/24 1155 02/13/24 0303 02/14/24 0539  WBC 16.6*  --  11.1* 7.1  LATICACIDVEN 4.6* 1.5  --   --     Liver Function Tests: Recent Labs  Lab 02/11/24 2139 02/13/24 0303 02/14/24 0539  AST 21 63* 36  ALT 22 20 20   ALKPHOS 113 72 72  BILITOT 1.9* 1.4* 1.0  PROT 8.5* 6.3* 5.3*  ALBUMIN  4.7 3.3* 2.6*   No results for input(s): "LIPASE", "AMYLASE" in the last 168 hours. No results for input(s): "AMMONIA" in the last 168 hours.  ABG    Component Value Date/Time   PHART 7.477 (H) 05/27/2022 0508   PCO2ART 35.2 05/27/2022 0508   PO2ART 178 (H) 05/27/2022 0508   HCO3 19.8 (L) 02/12/2024 1618   TCO2 27 05/27/2022 0508   ACIDBASEDEF 4.0 (H) 02/12/2024 1618   O2SAT 96 02/12/2024 1618     Coagulation Profile: No results for input(s): "INR", "PROTIME" in the last 168 hours.  Cardiac Enzymes: No results for input(s): "CKTOTAL", "CKMB", "CKMBINDEX", "TROPONINI" in the last 168 hours.  HbA1C: Hgb A1c MFr Bld  Date/Time Value Ref Range Status  09/03/2014 06:24 AM 5.5 <5.7 % Final    Comment:    (NOTE)                                                                       According to the ADA Clinical Practice Recommendations for 2011, when HbA1c  is used as a screening test:  >=6.5%   Diagnostic of  Diabetes Mellitus           (if abnormal result is confirmed) 5.7-6.4%   Increased risk of developing Diabetes Mellitus References:Diagnosis and Classification of Diabetes Mellitus,Diabetes Care,2011,34(Suppl 1):S62-S69 and Standards of Medical Care in         Diabetes - 2011,Diabetes Care,2011,34 (Suppl 1):S11-S61.     CBG: Recent Labs  Lab 02/15/24 1123 02/15/24 1606 02/15/24 2056 02/16/24 0011 02/16/24 0448  GLUCAP 218* 280* 324* 297* 231*    Review of Systems:   Awake alert interactive  Past Medical History:  He,  has a past medical history of ADHD (attention deficit hyperactivity disorder), Asthma, Bipolar affective disorder (HCC), and Schizophrenia (HCC).   Surgical History:   Past Surgical History:  Procedure Laterality Date   NO PAST SURGERIES       Social History:   reports that he has been smoking cigarettes. He has a 6 pack-year smoking history. He has never used smokeless tobacco. He reports current alcohol use of about 1.0 standard drink of alcohol per week. He reports current drug use. Drug: Marijuana.   Family History:  His family history includes Hypertension in his mother.   Allergies Allergies  Allergen Reactions   Pork-Derived Products Other (See Comments)    Does not eat it   Shrimp [Shellfish Allergy] Anaphylaxis    The patient is critically ill with multiple organ systems failure and requires high complexity decision making for assessment and support, frequent evaluation and titration of therapies, application of advanced monitoring technologies and extensive interpretation of multiple databases. Critical Care Time devoted to patient care services described in this note independent of APP/resident time (if applicable)  is 30 minutes.   Myer Artis MD [facility] Personal pager: See Amion If unanswered, please page CCM On-call: #[phone]

## 2024-02-17 ENCOUNTER — Inpatient Hospital Stay (HOSPITAL_COMMUNITY): Payer: MEDICAID

## 2024-02-17 DIAGNOSIS — E1110 Type 2 diabetes mellitus with ketoacidosis without coma: Secondary | ICD-10-CM | POA: Diagnosis not present

## 2024-02-17 LAB — CBC WITH DIFFERENTIAL/PLATELET
Abs Immature Granulocytes: 0.38 10*3/uL — ABNORMAL HIGH (ref 0.00–0.07)
Basophils Absolute: 0.1 10*3/uL (ref 0.0–0.1)
Basophils Relative: 1 %
Eosinophils Absolute: 0.3 10*3/uL (ref 0.0–0.5)
Eosinophils Relative: 3 %
HCT: 29.9 % — ABNORMAL LOW (ref 39.0–52.0)
Hemoglobin: 9.5 g/dL — ABNORMAL LOW (ref 13.0–17.0)
Immature Granulocytes: 4 %
Lymphocytes Relative: 28 %
Lymphs Abs: 2.5 10*3/uL (ref 0.7–4.0)
MCH: 26.8 pg (ref 26.0–34.0)
MCHC: 31.8 g/dL (ref 30.0–36.0)
MCV: 84.2 fL (ref 80.0–100.0)
Monocytes Absolute: 1.1 10*3/uL — ABNORMAL HIGH (ref 0.1–1.0)
Monocytes Relative: 13 %
Neutro Abs: 4.5 10*3/uL (ref 1.7–7.7)
Neutrophils Relative %: 51 %
Platelets: 277 10*3/uL (ref 150–400)
RBC: 3.55 MIL/uL — ABNORMAL LOW (ref 4.22–5.81)
RDW: 13.1 % (ref 11.5–15.5)
WBC: 8.8 10*3/uL (ref 4.0–10.5)
nRBC: 0 % (ref 0.0–0.2)

## 2024-02-17 LAB — CULTURE, BLOOD (ROUTINE X 2)
Culture: NO GROWTH
Culture: NO GROWTH

## 2024-02-17 LAB — COMPREHENSIVE METABOLIC PANEL WITH GFR
ALT: 26 U/L (ref 0–44)
AST: 26 U/L (ref 15–41)
Albumin: 2.8 g/dL — ABNORMAL LOW (ref 3.5–5.0)
Alkaline Phosphatase: 80 U/L (ref 38–126)
Anion gap: 7 (ref 5–15)
BUN: 34 mg/dL — ABNORMAL HIGH (ref 6–20)
CO2: 24 mmol/L (ref 22–32)
Calcium: 9.2 mg/dL (ref 8.9–10.3)
Chloride: 114 mmol/L — ABNORMAL HIGH (ref 98–111)
Creatinine, Ser: 2.51 mg/dL — ABNORMAL HIGH (ref 0.61–1.24)
GFR, Estimated: 34 mL/min — ABNORMAL LOW (ref >60)
Glucose, Bld: 154 mg/dL — ABNORMAL HIGH (ref 70–99)
Potassium: 3.1 mmol/L — ABNORMAL LOW (ref 3.5–5.1)
Sodium: 145 mmol/L (ref 135–145)
Total Bilirubin: 0.3 mg/dL (ref 0.0–1.2)
Total Protein: 5.7 g/dL — ABNORMAL LOW (ref 6.5–8.1)

## 2024-02-17 LAB — GLUCOSE, CAPILLARY
Glucose-Capillary: 119 mg/dL — ABNORMAL HIGH (ref 70–99)
Glucose-Capillary: 140 mg/dL — ABNORMAL HIGH (ref 70–99)
Glucose-Capillary: 155 mg/dL — ABNORMAL HIGH (ref 70–99)
Glucose-Capillary: 163 mg/dL — ABNORMAL HIGH (ref 70–99)
Glucose-Capillary: 170 mg/dL — ABNORMAL HIGH (ref 70–99)
Glucose-Capillary: 309 mg/dL — ABNORMAL HIGH (ref 70–99)
Glucose-Capillary: 95 mg/dL (ref 70–99)

## 2024-02-17 LAB — PHOSPHORUS: Phosphorus: 1.8 mg/dL — ABNORMAL LOW (ref 2.5–4.6)

## 2024-02-17 LAB — MAGNESIUM: Magnesium: 2.1 mg/dL (ref 1.7–2.4)

## 2024-02-17 MED ORDER — AMOXICILLIN-POT CLAVULANATE 875-125 MG PO TABS
1.0000 | ORAL_TABLET | Freq: Two times a day (BID) | ORAL | Status: DC
  Administered 2024-02-17 – 2024-02-18 (×3): 1 via ORAL
  Filled 2024-02-17 (×3): qty 1

## 2024-02-17 MED ORDER — INSULIN GLARGINE-YFGN 100 UNIT/ML ~~LOC~~ SOLN
35.0000 [IU] | Freq: Two times a day (BID) | SUBCUTANEOUS | Status: DC
  Administered 2024-02-17 – 2024-02-18 (×2): 35 [IU] via SUBCUTANEOUS
  Filled 2024-02-17 (×3): qty 0.35

## 2024-02-17 NOTE — Inpatient Diabetes Management (Signed)
 Inpatient Diabetes Program Recommendations  AACE/ADA: New Consensus Statement on Inpatient Glycemic Control   Target Ranges:  Prepandial:   less than 140 mg/dL      Peak postprandial:   less than 180 mg/dL (1-2 hours)      Critically ill patients:  140 - 180 mg/dL    Latest Reference Range & Units 02/16/24 07:59 02/16/24 11:54 02/16/24 15:23 02/16/24 19:30 02/17/24 00:05 02/17/24 04:07  Glucose-Capillary 70 - 99 mg/dL 621 (H)  Novolog  7 units  Semglee  30 units 232 (H)  Novolog  7 units 341 (H)  Novolog  15 units 234 (H)  Novolog  7 units @21 :00  Semglee  30 units @22 :30 309 (H)  Novolog  15 units 119 (H)   Review of Glycemic Control  Diabetes history: DM2 Outpatient Diabetes medications: Metformin  250 mg BID Current orders for Inpatient glycemic control: Semglee  30 units BID, Novolog  0-20 units Q4H   Inpatient Diabetes Program Recommendations:     Insulin : Please consider increasing Semglee  to 35 units BID. May need to add meal coverage if patient is eating and post prandial is consistently over 180 mg/dl.   NOTE: Noted consult for Diabetes Coordinator. Diabetes Coordinator is not on campus over the weekend but available by pager from 8am to 5pm for questions or concerns. Chart reviewed.    Thanks, Beacher Limerick, RN, MSN, CDCES Diabetes Coordinator Inpatient Diabetes Program [phone] (Team Pager from 8am to 5pm)

## 2024-02-17 NOTE — Progress Notes (Signed)
 PROGRESS NOTE    Philip [name]  UJW:[id number] DOB: 04-20-1991 DOA: 02/11/2024 PCP: Dianah Fort, PA   Brief Narrative: 33 year old male with history of type 2 diabetes and hypertension stopped taking his medications and was admitted to the ICU with DKA on insulin  drip.  He came in lethargic with Kussmaul's respiration unable to answer questions.  He was treated with IV fluids insulin  drip and Precedex  with improvement.  Continued to have low-grade temp chest x-ray done today shows concerning findings concerning for pneumonia probably aspiration his chest x-ray on admission was clear.  He is a TRH pickup on 02/17/2024. Chart reviewed and patient seen. Assessment & Plan:   Principal Problem:   DKA (diabetic ketoacidosis) (HCC)   #1 diabetic ketoacidosis secondary to noncompliance with other psych diagnoses.  Patient was admitted to the ICU on insulin  drip fluids and Precedex .  His gap is closed he was started on Semglee . CBG (last 3)  Recent Labs    02/17/24 0407 02/17/24 0751 02/17/24 1204  GLUCAP 119* 163* 170*   Semglee  increased to 35 units twice daily. I do not have an A1c on him will add to labs today.  Last A1c is 5.5 from 2015.  #2 metabolic encephalopathy secondary to DKA resolved  #3 bipolar disorder on Zyprexa   #4 AKI he has or had a normal creatinine at baseline from 2023 on admission it was 3.28 it has come down to 2.51 after all the treatments he received.  #5 hypertension on lisinopril   #6 possible aspiration pneumonia by chest x-ray started Augmentin     Nutrition Problem: Inadequate oral intake Etiology: inability to eat  Signs/Symptoms: NPO status  Interventions: Refer to RD note for recommendations, Tube feeding  Estimated body mass index is 29.2 kg/m as calculated from the following:   Height as of this encounter: 6\' 1"  (1.854 m).   Weight as of this encounter: 100.4 kg.  DVT prophylaxis: Lovenox  Code Status: Full code Family Communication:  None at bedside Disposition Plan:  Status is: Inpatient Remains inpatient appropriate because: Acute illness   Consultants: PCCM  Procedures: None Antimicrobials: Augmentin   Subjective: Patient resting in bed he has no new complaints today no nausea vomiting reported no diarrhea reported has occasional cough  Objective: Vitals:   02/16/24 1838 02/16/24 1928 02/17/24 0406 02/17/24 0409  BP: (!) 164/109 (!) 165/98 (!) 154/96   Pulse: 96 98 (!) 101   Resp: 18 18 18    Temp: 98.1 F (36.7 C) 98.6 F (37 C) 98.7 F (37.1 C)   TempSrc: Oral Oral Oral   SpO2: 100% 100% 98%   Weight:    100.4 kg  Height:        Intake/Output Summary (Last 24 hours) at 02/17/2024 1207 Last data filed at 02/17/2024 0409 Gross per 24 hour  Intake 2040 ml  Output 850 ml  Net 1190 ml   Filed Weights   02/15/24 0500 02/16/24 0500 02/17/24 0409  Weight: 101 kg 101.3 kg 100.4 kg    Examination:  General exam: Appears in nad Respiratory system: Clear to auscultation. Respiratory effort normal. Cardiovascular system: S1 & S2 heard, RRR. No JVD, murmurs, rubs, gallops or clicks. No pedal edema. Gastrointestinal system: Abdomen is nondistended, soft and nontender. No organomegaly or masses felt. Normal bowel sounds heard. Central nervous system: Alert and oriented. No focal neurological deficits. Extremities: no edema   Data Reviewed: I have personally reviewed following labs and imaging studies  CBC: Recent Labs  Lab 02/11/24 2139 02/13/24 0303  02/14/24 0539 02/17/24 0530  WBC 16.6* 11.1* 7.1 8.8  NEUTROABS 14.1* 8.5*  --  4.5  HGB 13.7 10.8* 9.5* 9.5*  HCT 42.0 33.2* 29.1* 29.9*  MCV 82.5 80.8 82.0 84.2  PLT 413* 223 190 277   Basic Metabolic Panel: Recent Labs  Lab 02/14/24 0539 02/14/24 1154 02/14/24 1712 02/15/24 0455 02/15/24 1741 02/16/24 0443 02/16/24 1656 02/17/24 0530  NA 135 135  --  146*  --  147*  --  145  K 3.5 4.2  --  3.5  --  3.3*  --  3.1*  CL 108 109  --  118*   --  117*  --  114*  CO2 14* 16*  --  18*  --  22  --  24  GLUCOSE 609* 726*  --  220*  --  269*  --  154*  BUN 65* 71*  --  63*  --  53*  --  34*  CREATININE 3.47* 3.66*  --  3.35*  --  2.92*  --  2.51*  CALCIUM  8.7* 8.6*  --  9.1  --  8.8*  --  9.2  MG 2.6*  --    < > 2.5* 2.1 2.2 2.1 2.1  PHOS 1.5*  --    < > 2.3* 2.2* 2.1* 2.2* 1.8*   < > = values in this interval not displayed.   GFR: Estimated Creatinine Clearance: 51.7 mL/min (A) (by C-G formula based on SCr of 2.51 mg/dL (H)). Liver Function Tests: Recent Labs  Lab 02/11/24 2139 02/13/24 0303 02/14/24 0539 02/17/24 0530  AST 21 63* 36 26  ALT 22 20 20 26   ALKPHOS 113 72 72 80  BILITOT 1.9* 1.4* 1.0 0.3  PROT 8.5* 6.3* 5.3* 5.7*  ALBUMIN  4.7 3.3* 2.6* 2.8*   No results for input(s): "LIPASE", "AMYLASE" in the last 168 hours. No results for input(s): "AMMONIA" in the last 168 hours. Coagulation Profile: No results for input(s): "INR", "PROTIME" in the last 168 hours. Cardiac Enzymes: No results for input(s): "CKTOTAL", "CKMB", "CKMBINDEX", "TROPONINI" in the last 168 hours. BNP (last 3 results) No results for input(s): "PROBNP" in the last 8760 hours. HbA1C: No results for input(s): "HGBA1C" in the last 72 hours. CBG: Recent Labs  Lab 02/16/24 1930 02/17/24 0005 02/17/24 0407 02/17/24 0751 02/17/24 1204  GLUCAP 234* 309* 119* 163* 170*   Lipid Profile: No results for input(s): "CHOL", "HDL", "LDLCALC", "TRIG", "CHOLHDL", "LDLDIRECT" in the last 72 hours. Thyroid Function Tests: No results for input(s): "TSH", "T4TOTAL", "FREET4", "T3FREE", "THYROIDAB" in the last 72 hours. Anemia Panel: No results for input(s): "VITAMINB12", "FOLATE", "FERRITIN", "TIBC", "IRON", "RETICCTPCT" in the last 72 hours. Sepsis Labs: Recent Labs  Lab 02/11/24 2139 02/12/24 1155  LATICACIDVEN 4.6* 1.5    Recent Results (from the past 240 hours)  MRSA Next Gen by PCR, Nasal     Status: None   Collection Time: 02/12/24 12:53  AM   Specimen: Nasal Mucosa; Nasal Swab  Result Value Ref Range Status   MRSA by PCR Next Gen NOT DETECTED NOT DETECTED Final    Comment: (NOTE) The GeneXpert MRSA Assay (FDA approved for NASAL specimens only), is one component of a comprehensive MRSA colonization surveillance program. It is not intended to diagnose MRSA infection nor to guide or monitor treatment for MRSA infections. Test performance is not FDA approved in patients less than 67 years old. Performed at [facility], [address] [place], [place] [address]   Culture, blood (  Routine X 2) w Reflex to ID Panel     Status: None   Collection Time: 02/12/24  1:02 AM   Specimen: BLOOD RIGHT ARM  Result Value Ref Range Status   Specimen Description   Final    BLOOD RIGHT ARM Performed at [facility], [address], [place], [place] [address]    Special Requests   Final    BOTTLES DRAWN AEROBIC ONLY Blood Culture results may not be optimal due to an inadequate volume of blood received in culture bottles Performed at [facility], [address], [place], [place] [address]    Culture   Final    NO GROWTH 5 DAYS Performed at [facility], [address], [place], [place] [address]    Report Status 02/17/2024 FINAL  Final  Culture, blood (Routine X 2) w Reflex to ID Panel     Status: None   Collection Time: 02/12/24  6:16 AM   Specimen: BLOOD LEFT HAND  Result Value Ref Range Status   Specimen Description   Final    BLOOD LEFT HAND Performed at [facility], [address] [place], [place] [address]    Special Requests   Final    BOTTLES DRAWN AEROBIC AND ANAEROBIC Blood Culture results may not be optimal due to an inadequate volume of blood received in culture bottles Performed at [facility], [address], [place], [place] [address]    Culture   Final    NO GROWTH 5 DAYS Performed at [facility], [address] [place], [place] [address]    Report Status 02/17/2024 FINAL  Final         Radiology Studies: DG Chest Port 1 View Result Date: 02/17/2024 CLINICAL DATA:  33 year old male with shortness of breath, DKA. EXAM: PORTABLE CHEST 1 VIEW COMPARISON:  Portable chest 02/11/2024 and earlier. FINDINGS: Portable AP semi upright view at 0605 hours. Similar low lung volumes but patchy and confluent increased infrahilar opacity greater on the left. Underlying mediastinal contours appear to remain normal. Visualized tracheal air column is within normal limits. No pneumothorax, pulmonary edema, pleural effusion, air bronchograms. No osseous abnormality identified. Paucity of bowel gas in the visible abdomen. IMPRESSION: Acute left greater than right perihilar lung opacity which is nonspecific and could be aspiration, pneumonia, atelectasis in this setting. Electronically Signed   By: Marlise Simpers M.D.   On: 02/17/2024 08:24   US  RENAL Result Date: 02/16/2024 CLINICAL DATA:  AKI EXAM: RENAL / URINARY TRACT ULTRASOUND COMPLETE COMPARISON:  None Available. FINDINGS: Right Kidney: Renal measurements: 12.0 x 6.0 x 6.3 cm = volume: 238 mL. Echogenicity within normal limits. No mass or hydronephrosis visualized. Left Kidney: Renal measurements: 12.8 x 6.3 x 5.7 cm = volume: 240 mL. Echogenicity within normal limits. No mass or hydronephrosis visualized. Bladder: Decompressed bladder about a Foley catheter in the bladder. Other: None. IMPRESSION: Unremarkable renal ultrasound. Electronically Signed   By: Rozell Cornet M.D.   On: 02/16/2024 22:05   DG Abd 1 View Result Date: 02/15/2024 CLINICAL DATA:  Vomiting, altered mental status EXAM: ABDOMEN - 1 VIEW COMPARISON:  February 13, 2024 FINDINGS: Weighted feeding tube in place terminating in the mid duodenum, unchanged. Nonobstructive bowel gas pattern. No pneumoperitoneum. No organomegaly or radiopaque calculi. No acute fracture or destructive lesion. The lung bases are clear.  IMPRESSION: Nonobstructive bowel gas pattern. Similarly positioned weighted feeding tube, which terminates in the mid duodenum. Electronically  Signed   By: Rance Burrows M.D.   On: 02/15/2024 18:40    Scheduled Meds:  amoxicillin -clavulanate  1 tablet Oral Q12H   Chlorhexidine  Gluconate Cloth  6 each Topical Daily   clonazePAM   0.5 mg Per Tube BID   insulin  aspart  0-20 Units Subcutaneous Q4H   insulin  glargine-yfgn  30 Units Subcutaneous BID   lamoTRIgine   150 mg Oral Daily   lisinopril   20 mg Oral Daily   metoCLOPramide   10 mg Oral TID AC   metoprolol  tartrate  100 mg Oral BID   OLANZapine   15 mg Oral QHS   OLANZapine   5 mg Oral Daily   mouth rinse  15 mL Mouth Rinse 4 times per day   rosuvastatin   20 mg Oral Daily   Continuous Infusions:   LOS: 6 days   Time spent: 38 min  Barbee Lew, MD  02/17/2024, 12:07 PM

## 2024-02-17 NOTE — Plan of Care (Signed)
  Problem: Safety: Goal: Non-violent Restraint(s) Outcome: Progressing   Problem: Education: Goal: Knowledge of General Education information will improve Description: Including pain rating scale, medication(s)/side effects and non-pharmacologic comfort measures Outcome: Progressing   Problem: Health Behavior/Discharge Planning: Goal: Ability to manage health-related needs will improve Outcome: Progressing   Problem: Clinical Measurements: Goal: Ability to maintain clinical measurements within normal limits will improve Outcome: Progressing Goal: Will remain free from infection Outcome: Progressing Goal: Diagnostic test results will improve Outcome: Progressing Goal: Respiratory complications will improve Outcome: Progressing Goal: Cardiovascular complication will be avoided Outcome: Progressing   Problem: Activity: Goal: Risk for activity intolerance will decrease Outcome: Progressing   Problem: Nutrition: Goal: Adequate nutrition will be maintained Outcome: Progressing   Problem: Coping: Goal: Level of anxiety will decrease Outcome: Progressing   Problem: Elimination: Goal: Will not experience complications related to bowel motility Outcome: Progressing Goal: Will not experience complications related to urinary retention Outcome: Progressing   Problem: Pain Managment: Goal: General experience of comfort will improve and/or be controlled Outcome: Progressing   Problem: Safety: Goal: Ability to remain free from injury will improve Outcome: Progressing   Problem: Skin Integrity: Goal: Risk for impaired skin integrity will decrease Outcome: Progressing   Problem: Education: Goal: Ability to describe self-care measures that may prevent or decrease complications (Diabetes Survival Skills Education) will improve Outcome: Progressing Goal: Individualized Educational Video(s) Outcome: Progressing   Problem: Coping: Goal: Ability to adjust to condition or change  in health will improve Outcome: Progressing   Problem: Fluid Volume: Goal: Ability to maintain a balanced intake and output will improve Outcome: Progressing   Problem: Health Behavior/Discharge Planning: Goal: Ability to identify and utilize available resources and services will improve Outcome: Progressing Goal: Ability to manage health-related needs will improve Outcome: Progressing   Problem: Metabolic: Goal: Ability to maintain appropriate glucose levels will improve Outcome: Progressing   Problem: Nutritional: Goal: Maintenance of adequate nutrition will improve Outcome: Progressing Goal: Progress toward achieving an optimal weight will improve Outcome: Progressing   Problem: Skin Integrity: Goal: Risk for impaired skin integrity will decrease Outcome: Progressing   Problem: Tissue Perfusion: Goal: Adequacy of tissue perfusion will improve Outcome: Progressing   Problem: Education: Goal: Ability to describe self-care measures that may prevent or decrease complications (Diabetes Survival Skills Education) will improve Outcome: Progressing Goal: Individualized Educational Video(s) Outcome: Progressing   Problem: Cardiac: Goal: Ability to maintain an adequate cardiac output will improve Outcome: Progressing   Problem: Health Behavior/Discharge Planning: Goal: Ability to identify and utilize available resources and services will improve Outcome: Progressing Goal: Ability to manage health-related needs will improve Outcome: Progressing   Problem: Fluid Volume: Goal: Ability to achieve a balanced intake and output will improve Outcome: Progressing   Problem: Metabolic: Goal: Ability to maintain appropriate glucose levels will improve Outcome: Progressing   Problem: Nutritional: Goal: Maintenance of adequate nutrition will improve Outcome: Progressing Goal: Maintenance of adequate weight for body size and type will improve Outcome: Progressing   Problem:  Respiratory: Goal: Will regain and/or maintain adequate ventilation Outcome: Progressing   Problem: Urinary Elimination: Goal: Ability to achieve and maintain adequate renal perfusion and functioning will improve Outcome: Progressing

## 2024-02-17 NOTE — Plan of Care (Signed)
  Problem: Education: Goal: Knowledge of General Education information will improve Description: Including pain rating scale, medication(s)/side effects and non-pharmacologic comfort measures Outcome: Not Progressing   Problem: Clinical Measurements: Goal: Ability to maintain clinical measurements within normal limits will improve Outcome: Progressing Goal: Diagnostic test results will improve Outcome: Progressing   Problem: Activity: Goal: Risk for activity intolerance will decrease Outcome: Progressing

## 2024-02-18 ENCOUNTER — Other Ambulatory Visit (HOSPITAL_COMMUNITY): Payer: Self-pay

## 2024-02-18 DIAGNOSIS — E1110 Type 2 diabetes mellitus with ketoacidosis without coma: Secondary | ICD-10-CM | POA: Diagnosis not present

## 2024-02-18 LAB — CBC
HCT: 29.4 % — ABNORMAL LOW (ref 39.0–52.0)
Hemoglobin: 9.2 g/dL — ABNORMAL LOW (ref 13.0–17.0)
MCH: 27 pg (ref 26.0–34.0)
MCHC: 31.3 g/dL (ref 30.0–36.0)
MCV: 86.2 fL (ref 80.0–100.0)
Platelets: 321 10*3/uL (ref 150–400)
RBC: 3.41 MIL/uL — ABNORMAL LOW (ref 4.22–5.81)
RDW: 13.1 % (ref 11.5–15.5)
WBC: 8.7 10*3/uL (ref 4.0–10.5)
nRBC: 0 % (ref 0.0–0.2)

## 2024-02-18 LAB — GLUCOSE, CAPILLARY
Glucose-Capillary: 99 mg/dL (ref 70–99)
Glucose-Capillary: 92 mg/dL (ref 70–99)
Glucose-Capillary: 114 mg/dL — ABNORMAL HIGH (ref 70–99)
Glucose-Capillary: 156 mg/dL — ABNORMAL HIGH (ref 70–99)

## 2024-02-18 LAB — BASIC METABOLIC PANEL WITH GFR
Anion gap: 10 (ref 5–15)
BUN: 23 mg/dL — ABNORMAL HIGH (ref 6–20)
CO2: 25 mmol/L (ref 22–32)
Calcium: 9 mg/dL (ref 8.9–10.3)
Chloride: 111 mmol/L (ref 98–111)
Creatinine, Ser: 2.09 mg/dL — ABNORMAL HIGH (ref 0.61–1.24)
GFR, Estimated: 42 mL/min — ABNORMAL LOW (ref >60)
Glucose, Bld: 90 mg/dL (ref 70–99)
Potassium: 3 mmol/L — ABNORMAL LOW (ref 3.5–5.1)
Sodium: 146 mmol/L — ABNORMAL HIGH (ref 135–145)

## 2024-02-18 LAB — HEMOGLOBIN A1C
Hgb A1c MFr Bld: 13.2 % — ABNORMAL HIGH (ref 4.8–5.6)
Mean Plasma Glucose: 332 mg/dL

## 2024-02-18 MED ORDER — INSULIN PEN NEEDLE 31G X 8 MM MISC
35.0000 [IU] | Freq: Two times a day (BID) | 11 refills | Status: AC
  Filled 2024-02-18: qty 100, 30d supply, fill #0
  Filled 2024-04-16 – 2024-05-01 (×2): qty 100, 30d supply, fill #1
  Filled 2024-07-16: qty 100, 30d supply, fill #2
  Filled 2024-09-10: qty 100, 30d supply, fill #3
  Filled 2024-11-07 – 2024-11-08 (×2): qty 100, 30d supply, fill #4

## 2024-02-18 MED ORDER — INSULIN GLARGINE 100 UNIT/ML SOLOSTAR PEN
35.0000 [IU] | PEN_INJECTOR | Freq: Two times a day (BID) | SUBCUTANEOUS | 11 refills | Status: AC
  Filled 2024-02-18: qty 15, 21d supply, fill #0
  Filled 2024-03-03 – 2024-03-04 (×3): qty 15, 21d supply, fill #1
  Filled 2024-03-27 (×2): qty 15, 21d supply, fill #2
  Filled 2024-04-16: qty 15, 21d supply, fill #3
  Filled 2024-05-01: qty 15, 21d supply, fill #4
  Filled 2024-06-19: qty 15, 21d supply, fill #5
  Filled 2024-07-14: qty 15, 21d supply, fill #6
  Filled 2024-08-13: qty 15, 21d supply, fill #7
  Filled 2024-09-10: qty 15, 21d supply, fill #8
  Filled 2024-10-02: qty 15, 21d supply, fill #9
  Filled 2024-11-07: qty 15, 21d supply, fill #10
  Filled 2024-12-01: qty 15, 21d supply, fill #11

## 2024-02-18 MED ORDER — AMOXICILLIN-POT CLAVULANATE 875-125 MG PO TABS
1.0000 | ORAL_TABLET | Freq: Two times a day (BID) | ORAL | 0 refills | Status: AC
  Filled 2024-02-18: qty 12, 6d supply, fill #0

## 2024-02-18 MED ORDER — POTASSIUM CHLORIDE CRYS ER 20 MEQ PO TBCR
40.0000 meq | EXTENDED_RELEASE_TABLET | Freq: Once | ORAL | Status: AC
  Administered 2024-02-18: 40 meq via ORAL
  Filled 2024-02-18: qty 2

## 2024-02-18 MED ORDER — METFORMIN HCL 500 MG PO TABS
250.0000 mg | ORAL_TABLET | Freq: Two times a day (BID) | ORAL | 2 refills | Status: AC
  Filled 2024-02-18: qty 60, 60d supply, fill #0

## 2024-02-18 MED ORDER — INSULIN GLARGINE 100 UNIT/ML ~~LOC~~ SOLN
35.0000 [IU] | Freq: Two times a day (BID) | SUBCUTANEOUS | 11 refills | Status: DC
  Filled 2024-02-18: qty 10, 14d supply, fill #0

## 2024-02-18 MED ORDER — HYDROXYZINE HCL 50 MG PO TABS
50.0000 mg | ORAL_TABLET | Freq: Three times a day (TID) | ORAL | 0 refills | Status: AC | PRN
Start: 2024-02-18 — End: ?

## 2024-02-18 NOTE — Inpatient Diabetes Management (Signed)
 Inpatient Diabetes Program Recommendations  AACE/ADA: New Consensus Statement on Inpatient Glycemic Control (2015)  Target Ranges:  Prepandial:   less than 140 mg/dL      Peak postprandial:   less than 180 mg/dL (1-2 hours)      Critically ill patients:  140 - 180 mg/dL   Lab Results  Component Value Date   GLUCAP 92 02/18/2024   HGBA1C 13.2 (H) 02/17/2024    Review of Glycemic Control  Latest Reference Range & Units 02/17/24 07:51 02/17/24 12:04 02/17/24 17:47 02/17/24 19:59 02/17/24 23:45 02/18/24 04:30 02/18/24 07:33  Glucose-Capillary 70 - 99 mg/dL 161 (H) 096 (H) 045 (H) 140 (H) 95 99 92  (H): Data is abnormally high  Patient is discharging today. Met with him at bedside.  He is not appropriate for insulin  teaching as he is confused.  Called and spoke with sister.  She works at the [employer].  She will be administering his Lantus  BID.  She is familiar with how to use an insulin  pen because she has used Wegovy in the past.  Educated patient on insulin  pen use at home. Reviewed contents of insulin  flexpen starter kit. Reviewed all steps of insulin  pen including attachment of needle, 2-unit air shot, dialing up dose, giving injection, removing needle, disposal of sharps, storage of unused insulin , disposal of insulin  etc. Lantus  has been delivered to bedside along with insulin  pen needles.  We discussed normal BG range and hyper and hypoglycemia.  Discussed signs and symptoms of both and treatment.    He would benefit from a CGM.  He has an Android at home.  I will leave a couple of sample Freestyle Libre's for them to take home.  She has used one in the past and can help him set it up at home.  He can share his glucose trends with her and she can receive high and low alarms as well.    Educated on The Plate Method, CHO's, portion control, avoiding caloric beverages, CBGs at home fasting and mid afternoon, F/U with PCP every 3 months, bring meter to PCP  office, long and short term complications of uncontrolled BG, and importance of exercise.  He will need to see PCP in 1-2 weeks and bring glucometer with him for PCP to review.  Call PCP with any concerns.    Will continue to follow while inpatient.  Thank you, Hays Lipschutz, MSN, CDCES Diabetes Coordinator Inpatient Diabetes Program [phone] (team pager from 8a-5p)

## 2024-02-18 NOTE — Discharge Summary (Signed)
 Physician Discharge Summary  Philip [name] ZOX:[id number] DOB: 1991/05/19 DOA: 02/11/2024  PCP: Dianah Fort, PA  Admit date: 02/11/2024 Discharge date: 02/18/2024  Admitted From: Home Disposition: Home Recommendations for Outpatient Follow-up:  Follow up with PCP in 1-2 weeks Please obtain BMP/CBC in one week  Home Health: None Equipment/Devices: None  Discharge Condition: Stable CODE STATUS: Full code Diet recommendation carb modified diet Brief/Interim Summary:  33 year old male with history of type 2 diabetes and hypertension stopped taking his medications and was admitted to the ICU with DKA on insulin  drip.  He came in lethargic with Kussmaul's respiration unable to answer questions.  He was treated with IV fluids insulin  drip and Precedex  with improvement.  Continued to have low-grade temp chest x-ray done today shows concerning findings concerning for pneumonia probably aspiration his chest x-ray on admission was clear.  He was a TRH pickup on 02/17/2024.   Discharge Diagnoses:  Principal Problem:   DKA (diabetic ketoacidosis) (HCC)  #1 diabetic ketoacidosis secondary to noncompliance.  Patient was admitted to the ICU on insulin  drip fluids and Precedex .  As his gap is closed she was started on Semglee  and was transferred out of the ICU.  He was seen by diabetic coordinator.  He was discharged on Semglee  pen 35 units twice a day.  His hemoglobin A1c was 13.2 on this admission.    #2 metabolic encephalopathy secondary to DKA resolved   #3 bipolar disorder on Zyprexa    #4 AKI he has or had a normal creatinine at baseline from 2023 on admission it was 3.28 it has come down to 2.09 on discharge.   #5 hypertension on lisinopril    #6 possible aspiration pneumonia by chest x-ray started Augmentin  4/20.  Patient reported feeling better no cough or low-grade fevers noted since starting Augmentin .   Nutrition Problem: Inadequate oral intake Etiology: inability to  eat  Signs/Symptoms: NPO status  Interventions: Refer to RD note for recommendations, Tube feeding  Estimated body mass index is 28.48 kg/m as calculated from the following:   Height as of this encounter: 6\' 1"  (1.854 m).   Weight as of this encounter: 97.9 kg.  Discharge Instructions  Discharge Instructions     Diet - low sodium heart healthy   Complete by: As directed    Increase activity slowly   Complete by: As directed       Allergies as of 02/18/2024       Reactions   Pork-derived Products Other (See Comments)   Does not eat it   Shrimp [shellfish Allergy] Anaphylaxis        Medication List     TAKE these medications    amoxicillin -clavulanate 875-125 MG tablet Commonly known as: AUGMENTIN  Take 1 tablet by mouth 2 (two) times daily.   B-D ULTRAFINE III SHORT PEN 31G X 8 MM Misc Generic drug: Insulin  Pen Needle use to inject insulin  35 Units 2 (two) times daily.   Crestor  20 MG tablet Generic drug: rosuvastatin  Take 20 mg by mouth daily.   hydrOXYzine  50 MG tablet Commonly known as: ATARAX  Take 1 tablet (50 mg total) by mouth 3 (three) times daily as needed for anxiety. What changed:  when to take this reasons to take this   lamoTRIgine  150 MG tablet Commonly known as: LAMICTAL  Take 150 mg by mouth daily.   Lantus  SoloStar 100 UNIT/ML Solostar Pen Generic drug: insulin  glargine Inject 35 Units into the skin 2 (two) times daily.   lisinopril  20 MG tablet Commonly known  as: ZESTRIL  Take 1 tablet (20 mg total) by mouth daily.   metFORMIN  500 MG tablet Commonly known as: GLUCOPHAGE  Take 0.5 tablets (250 mg total) by mouth 2 (two) times daily with a meal.   metoprolol  tartrate 100 MG tablet Commonly known as: LOPRESSOR  Take 1 tablet (100 mg total) by mouth 2 (two) times daily.   OLANZapine  15 MG tablet Commonly known as: ZYPREXA  Take 15 mg by mouth at bedtime.   OLANZapine  5 MG tablet Commonly known as: ZYPREXA  Take 5 mg by mouth  daily.        Follow-up Information     Dianah Fort, PA Follow up.   Specialty: Physician Assistant Contact information: [address] [name] [address] [phone]                Allergies  Allergen Reactions   Pork-Derived Products Other (See Comments)    Does not eat it   Shrimp [Shellfish Allergy] Anaphylaxis    Consultations:pccm   Procedures/Studies: DG Chest Port 1 View Result Date: 02/17/2024 CLINICAL DATA:  33 year old male with shortness of breath, DKA. EXAM: PORTABLE CHEST 1 VIEW COMPARISON:  Portable chest 02/11/2024 and earlier. FINDINGS: Portable AP semi upright view at 0605 hours. Similar low lung volumes but patchy and confluent increased infrahilar opacity greater on the left. Underlying mediastinal contours appear to remain normal. Visualized tracheal air column is within normal limits. No pneumothorax, pulmonary edema, pleural effusion, air bronchograms. No osseous abnormality identified. Paucity of bowel gas in the visible abdomen. IMPRESSION: Acute left greater than right perihilar lung opacity which is nonspecific and could be aspiration, pneumonia, atelectasis in this setting. Electronically Signed   By: Marlise Simpers M.D.   On: 02/17/2024 08:24   US  RENAL Result Date: 02/16/2024 CLINICAL DATA:  AKI EXAM: RENAL / URINARY TRACT ULTRASOUND COMPLETE COMPARISON:  None Available. FINDINGS: Right Kidney: Renal measurements: 12.0 x 6.0 x 6.3 cm = volume: 238 mL. Echogenicity within normal limits. No mass or hydronephrosis visualized. Left Kidney: Renal measurements: 12.8 x 6.3 x 5.7 cm = volume: 240 mL. Echogenicity within normal limits. No mass or hydronephrosis visualized. Bladder: Decompressed bladder about a Foley catheter in the bladder. Other: None. IMPRESSION: Unremarkable renal ultrasound. Electronically Signed   By: Rozell Cornet M.D.   On: 02/16/2024 22:05   DG Abd 1 View Result Date: 02/15/2024 CLINICAL DATA:  Vomiting, altered mental  status EXAM: ABDOMEN - 1 VIEW COMPARISON:  February 13, 2024 FINDINGS: Weighted feeding tube in place terminating in the mid duodenum, unchanged. Nonobstructive bowel gas pattern. No pneumoperitoneum. No organomegaly or radiopaque calculi. No acute fracture or destructive lesion. The lung bases are clear. IMPRESSION: Nonobstructive bowel gas pattern. Similarly positioned weighted feeding tube, which terminates in the mid duodenum. Electronically Signed   By: Rance Burrows M.D.   On: 02/15/2024 18:40   DG Abd 1 View Result Date: 02/13/2024 CLINICAL DATA:  [id number] Encounter for imaging study to confirm nasogastric (NG) tube placement [id number] EXAM: ABDOMEN - 1 VIEW COMPARISON:  None Available. FINDINGS: The bowel gas pattern is non-obstructive. No evidence of pneumoperitoneum, within the limitations of a supine film. No acute osseous abnormalities. The soft tissues are within normal limits. Surgical changes, devices, tubes and lines: Dobbhoff tube is seen coursing below the left hemidiaphragm with its tip overlying the region of distal duodenum. IMPRESSION: Dobbhoff tube is seen coursing below the left hemidiaphragm with its tip overlying the region of distal duodenum. Electronically Signed   By: Beula Brunswick  M.D.   On: 02/13/2024 11:28   CT Head Wo Contrast Result Date: 02/12/2024 CLINICAL DATA:  Altered mental status EXAM: CT HEAD WITHOUT CONTRAST TECHNIQUE: Contiguous axial images were obtained from the base of the skull through the vertex without intravenous contrast. RADIATION DOSE REDUCTION: This exam was performed according to the departmental dose-optimization program which includes automated exposure control, adjustment of the mA and/or kV according to patient size and/or use of iterative reconstruction technique. COMPARISON:  06/25/2012 FINDINGS: Brain: No evidence of acute infarction, hemorrhage, hydrocephalus, extra-axial collection or mass lesion/mass effect. Vascular: No hyperdense vessel or  unexpected calcification. Skull: Normal. Negative for fracture or focal lesion. Sinuses/Orbits: No acute finding. Other: None. IMPRESSION: No acute intracranial abnormality noted. Electronically Signed   By: Violeta Grey M.D.   On: 02/12/2024 00:47   DG Chest Portable 1 View Result Date: 02/11/2024 CLINICAL DATA:  Altered mental status, hyperglycemia EXAM: PORTABLE CHEST 1 VIEW COMPARISON:  None Available. FINDINGS: The heart size and mediastinal contours are within normal limits. Both lungs are clear. The visualized skeletal structures are unremarkable. IMPRESSION: No active disease. Electronically Signed   By: Worthy Heads M.D.   On: 02/11/2024 22:19   (Echo, Carotid, EGD, Colonoscopy, ERCP)    Subjective:  Resting in bed No cough sob Anxious to go home  Discharge Exam: Vitals:   02/17/24 1957 02/18/24 0431  BP: (!) 159/107 (!) 148/92  Pulse: (!) 106 92  Resp: 16 18  Temp: 98.4 F (36.9 C) 98.5 F (36.9 C)  SpO2: 99% 98%   Vitals:   02/17/24 0406 02/17/24 0409 02/17/24 1957 02/18/24 0431  BP: (!) 154/96  (!) 159/107 (!) 148/92  Pulse: (!) 101  (!) 106 92  Resp: 18  16 18   Temp: 98.7 F (37.1 C)  98.4 F (36.9 C) 98.5 F (36.9 C)  TempSrc: Oral  Oral Oral  SpO2: 98%  99% 98%  Weight:  100.4 kg  97.9 kg  Height:        General: Pt is alert, awake, not in acute distress Cardiovascular: RRR, S1/S2 +, no rubs, no gallops Respiratory: CTA bilaterally, no wheezing, no rhonchi Abdominal: Soft, NT, ND, bowel sounds + Extremities: no edema, no cyanosis    The results of significant diagnostics from this hospitalization (including imaging, microbiology, ancillary and laboratory) are listed below for reference.     Microbiology: Recent Results (from the past 240 hours)  MRSA Next Gen by PCR, Nasal     Status: None   Collection Time: 02/12/24 12:53 AM   Specimen: Nasal Mucosa; Nasal Swab  Result Value Ref Range Status   MRSA by PCR Next Gen NOT DETECTED NOT DETECTED  Final    Comment: (NOTE) The GeneXpert MRSA Assay (FDA approved for NASAL specimens only), is one component of a comprehensive MRSA colonization surveillance program. It is not intended to diagnose MRSA infection nor to guide or monitor treatment for MRSA infections. Test performance is not FDA approved in patients less than 27 years old. Performed at [facility], [address], [place], [place] [address]   Culture, blood (Routine X 2) w Reflex to ID Panel     Status: None   Collection Time: 02/12/24  1:02 AM   Specimen: BLOOD RIGHT ARM  Result Value Ref Range Status   Specimen Description   Final    BLOOD RIGHT ARM Performed at [facility], [address], [place], [place] [address]    Special Requests   Final  BOTTLES DRAWN AEROBIC ONLY Blood Culture results may not be optimal due to an inadequate volume of blood received in culture bottles Performed at [facility], [address], [place], [place] [address]    Culture   Final    NO GROWTH 5 DAYS Performed at [facility], [address] [place], [place] [address]    Report Status 02/17/2024 FINAL  Final  Culture, blood (Routine X 2) w Reflex to ID Panel     Status: None   Collection Time: 02/12/24  6:16 AM   Specimen: BLOOD LEFT HAND  Result Value Ref Range Status   Specimen Description   Final    BLOOD LEFT HAND Performed at [facility], [address] [place], [place] [address]    Special Requests   Final    BOTTLES DRAWN AEROBIC AND ANAEROBIC Blood Culture results may not be optimal due to an inadequate volume of blood received in culture bottles Performed at [facility], [address] [place], [place] [address]    Culture   Final    NO GROWTH 5 DAYS Performed at [facility], [address], [place], [place] [address]    Report Status 02/17/2024 FINAL  Final     Labs: BNP (last 3 results) No results for  input(s): "BNP" in the last 8760 hours. Basic Metabolic Panel: Recent Labs  Lab 02/14/24 1154 02/14/24 1712 02/15/24 0455 02/15/24 1741 02/16/24 0443 02/16/24 1656 02/17/24 0530 02/18/24 0533  NA 135  --  146*  --  147*  --  145 146*  K 4.2  --  3.5  --  3.3*  --  3.1* 3.0*  CL 109  --  118*  --  117*  --  114* 111  CO2 16*  --  18*  --  22  --  24 25  GLUCOSE 726*  --  220*  --  269*  --  154* 90  BUN 71*  --  63*  --  53*  --  34* 23*  CREATININE 3.66*  --  3.35*  --  2.92*  --  2.51* 2.09*  CALCIUM  8.6*  --  9.1  --  8.8*  --  9.2 9.0  MG  --    < > 2.5* 2.1 2.2 2.1 2.1  --   PHOS  --    < > 2.3* 2.2* 2.1* 2.2* 1.8*  --    < > = values in this interval not displayed.   Liver Function Tests: Recent Labs  Lab 02/11/24 2139 02/13/24 0303 02/14/24 0539 02/17/24 0530  AST 21 63* 36 26  ALT 22 20 20 26   ALKPHOS 113 72 72 80  BILITOT 1.9* 1.4* 1.0 0.3  PROT 8.5* 6.3* 5.3* 5.7*  ALBUMIN  4.7 3.3* 2.6* 2.8*   No results for input(s): "LIPASE", "AMYLASE" in the last 168 hours. No results for input(s): "AMMONIA" in the last 168 hours. CBC: Recent Labs  Lab 02/11/24 2139 02/13/24 0303 02/14/24 0539 02/17/24 0530 02/18/24 0533  WBC 16.6* 11.1* 7.1 8.8 8.7  NEUTROABS 14.1* 8.5*  --  4.5  --   HGB 13.7 10.8* 9.5* 9.5* 9.2*  HCT 42.0 33.2* 29.1* 29.9* 29.4*  MCV 82.5 80.8 82.0 84.2 86.2  PLT 413* 223 190 277 321   Cardiac Enzymes: No results for input(s): "CKTOTAL", "CKMB", "CKMBINDEX", "TROPONINI" in the last 168 hours. BNP: Invalid input(s): "POCBNP" CBG: Recent Labs  Lab 02/17/24 1959 02/17/24 2345  02/18/24 0430 02/18/24 0733 02/18/24 1143  GLUCAP 140* 95 99 92 114*   D-Dimer No results for input(s): "DDIMER" in the last 72 hours. Hgb A1c Recent Labs    02/17/24 0530  HGBA1C 13.2*   Lipid Profile No results for input(s): "CHOL", "HDL", "LDLCALC", "TRIG", "CHOLHDL", "LDLDIRECT" in the last 72 hours. Thyroid function studies No results for input(s):  "TSH", "T4TOTAL", "T3FREE", "THYROIDAB" in the last 72 hours.  Invalid input(s): "FREET3" Anemia work up No results for input(s): "VITAMINB12", "FOLATE", "FERRITIN", "TIBC", "IRON", "RETICCTPCT" in the last 72 hours. Urinalysis    Component Value Date/Time   COLORURINE STRAW (A) 02/11/2024 2139   APPEARANCEUR CLEAR 02/11/2024 2139   LABSPEC 1.021 02/11/2024 2139   PHURINE 5.0 02/11/2024 2139   GLUCOSEU >=500 (A) 02/11/2024 2139   HGBUR LARGE (A) 02/11/2024 2139   BILIRUBINUR NEGATIVE 02/11/2024 2139   KETONESUR 80 (A) 02/11/2024 2139   PROTEINUR 100 (A) 02/11/2024 2139   NITRITE NEGATIVE 02/11/2024 2139   LEUKOCYTESUR NEGATIVE 02/11/2024 2139   Sepsis Labs Recent Labs  Lab 02/13/24 0303 02/14/24 0539 02/17/24 0530 02/18/24 0533  WBC 11.1* 7.1 8.8 8.7   Microbiology Recent Results (from the past 240 hours)  MRSA Next Gen by PCR, Nasal     Status: None   Collection Time: 02/12/24 12:53 AM   Specimen: Nasal Mucosa; Nasal Swab  Result Value Ref Range Status   MRSA by PCR Next Gen NOT DETECTED NOT DETECTED Final    Comment: (NOTE) The GeneXpert MRSA Assay (FDA approved for NASAL specimens only), is one component of a comprehensive MRSA colonization surveillance program. It is not intended to diagnose MRSA infection nor to guide or monitor treatment for MRSA infections. Test performance is not FDA approved in patients less than 70 years old. Performed at [facility], [address] [place], [place] [address]   Culture, blood (Routine X 2) w Reflex to ID Panel     Status: None   Collection Time: 02/12/24  1:02 AM   Specimen: BLOOD RIGHT ARM  Result Value Ref Range Status   Specimen Description   Final    BLOOD RIGHT ARM Performed at [facility], [address], [place], [place] [address]    Special Requests   Final    BOTTLES DRAWN AEROBIC ONLY Blood Culture results may not be optimal due to an inadequate volume of blood received  in culture bottles Performed at [facility], [address], [place], [place] [address]    Culture   Final    NO GROWTH 5 DAYS Performed at [facility], [address] [place], [place] [address]    Report Status 02/17/2024 FINAL  Final  Culture, blood (Routine X 2) w Reflex to ID Panel     Status: None   Collection Time: 02/12/24  6:16 AM   Specimen: BLOOD LEFT HAND  Result Value Ref Range Status   Specimen Description   Final    BLOOD LEFT HAND Performed at [facility], [address] [place], [place] [address]    Special Requests   Final    BOTTLES DRAWN AEROBIC AND ANAEROBIC Blood Culture results may not be optimal due to an inadequate volume of blood received in culture bottles Performed at [facility], [address], [place], [place] [address]    Culture   Final    NO GROWTH 5 DAYS Performed at [facility], [address],  [place], [place] [address]    Report Status 02/17/2024 FINAL  Final     Time coordinating discharge: 39 minutes  SIGNED:  Barbee Lew, MD  [facility] 02/18/2024, 1:53 PM Password [email or web address]

## 2024-02-18 NOTE — Plan of Care (Signed)
  Problem: Safety: Goal: Non-violent Restraint(s) Outcome: Progressing   Problem: Education: Goal: Knowledge of General Education information will improve Description: Including pain rating scale, medication(s)/side effects and non-pharmacologic comfort measures Outcome: Progressing   Problem: Health Behavior/Discharge Planning: Goal: Ability to manage health-related needs will improve Outcome: Progressing   Problem: Clinical Measurements: Goal: Ability to maintain clinical measurements within normal limits will improve Outcome: Progressing Goal: Will remain free from infection Outcome: Progressing Goal: Diagnostic test results will improve Outcome: Progressing Goal: Respiratory complications will improve Outcome: Progressing Goal: Cardiovascular complication will be avoided Outcome: Progressing   Problem: Activity: Goal: Risk for activity intolerance will decrease Outcome: Progressing   Problem: Nutrition: Goal: Adequate nutrition will be maintained Outcome: Progressing   Problem: Coping: Goal: Level of anxiety will decrease Outcome: Progressing   Problem: Elimination: Goal: Will not experience complications related to bowel motility Outcome: Progressing Goal: Will not experience complications related to urinary retention Outcome: Progressing   Problem: Pain Managment: Goal: General experience of comfort will improve and/or be controlled Outcome: Progressing   Problem: Safety: Goal: Ability to remain free from injury will improve Outcome: Progressing   Problem: Skin Integrity: Goal: Risk for impaired skin integrity will decrease Outcome: Progressing   Problem: Education: Goal: Ability to describe self-care measures that may prevent or decrease complications (Diabetes Survival Skills Education) will improve Outcome: Progressing Goal: Individualized Educational Video(s) Outcome: Progressing   Problem: Coping: Goal: Ability to adjust to condition or change  in health will improve Outcome: Progressing   Problem: Fluid Volume: Goal: Ability to maintain a balanced intake and output will improve Outcome: Progressing   Problem: Health Behavior/Discharge Planning: Goal: Ability to identify and utilize available resources and services will improve Outcome: Progressing Goal: Ability to manage health-related needs will improve Outcome: Progressing   Problem: Metabolic: Goal: Ability to maintain appropriate glucose levels will improve Outcome: Progressing   Problem: Nutritional: Goal: Maintenance of adequate nutrition will improve Outcome: Progressing Goal: Progress toward achieving an optimal weight will improve Outcome: Progressing   Problem: Skin Integrity: Goal: Risk for impaired skin integrity will decrease Outcome: Progressing   Problem: Tissue Perfusion: Goal: Adequacy of tissue perfusion will improve Outcome: Progressing   Problem: Education: Goal: Ability to describe self-care measures that may prevent or decrease complications (Diabetes Survival Skills Education) will improve Outcome: Progressing Goal: Individualized Educational Video(s) Outcome: Progressing   Problem: Cardiac: Goal: Ability to maintain an adequate cardiac output will improve Outcome: Progressing   Problem: Health Behavior/Discharge Planning: Goal: Ability to identify and utilize available resources and services will improve Outcome: Progressing Goal: Ability to manage health-related needs will improve Outcome: Progressing   Problem: Fluid Volume: Goal: Ability to achieve a balanced intake and output will improve Outcome: Progressing   Problem: Metabolic: Goal: Ability to maintain appropriate glucose levels will improve Outcome: Progressing   Problem: Nutritional: Goal: Maintenance of adequate nutrition will improve Outcome: Progressing Goal: Maintenance of adequate weight for body size and type will improve Outcome: Progressing   Problem:  Respiratory: Goal: Will regain and/or maintain adequate ventilation Outcome: Progressing   Problem: Urinary Elimination: Goal: Ability to achieve and maintain adequate renal perfusion and functioning will improve Outcome: Progressing

## 2024-02-19 ENCOUNTER — Other Ambulatory Visit: Payer: Self-pay

## 2024-02-19 MED ORDER — HYDROXYZINE HCL 50 MG PO TABS
50.0000 mg | ORAL_TABLET | Freq: Three times a day (TID) | ORAL | 0 refills | Status: AC | PRN
  Filled 2024-02-19 – 2024-10-05 (×4): qty 30, 10d supply, fill #0

## 2024-02-20 ENCOUNTER — Other Ambulatory Visit: Payer: Self-pay

## 2024-03-03 ENCOUNTER — Other Ambulatory Visit (HOSPITAL_COMMUNITY): Payer: Self-pay

## 2024-03-04 ENCOUNTER — Other Ambulatory Visit: Payer: Self-pay

## 2024-03-04 ENCOUNTER — Other Ambulatory Visit (HOSPITAL_COMMUNITY): Payer: Self-pay

## 2024-03-27 ENCOUNTER — Other Ambulatory Visit (HOSPITAL_COMMUNITY): Payer: Self-pay

## 2024-04-16 ENCOUNTER — Other Ambulatory Visit (HOSPITAL_COMMUNITY): Payer: Self-pay

## 2024-05-01 ENCOUNTER — Other Ambulatory Visit (HOSPITAL_COMMUNITY): Payer: Self-pay

## 2024-06-19 ENCOUNTER — Other Ambulatory Visit (HOSPITAL_COMMUNITY): Payer: Self-pay

## 2024-07-06 ENCOUNTER — Other Ambulatory Visit (HOSPITAL_COMMUNITY): Payer: Self-pay

## 2024-07-14 ENCOUNTER — Other Ambulatory Visit (HOSPITAL_COMMUNITY): Payer: Self-pay

## 2024-07-16 ENCOUNTER — Other Ambulatory Visit (HOSPITAL_COMMUNITY): Payer: Self-pay

## 2024-07-17 ENCOUNTER — Other Ambulatory Visit (HOSPITAL_COMMUNITY): Payer: Self-pay

## 2024-07-19 ENCOUNTER — Other Ambulatory Visit (HOSPITAL_COMMUNITY): Payer: Self-pay

## 2024-08-13 ENCOUNTER — Other Ambulatory Visit (HOSPITAL_COMMUNITY): Payer: Self-pay

## 2024-08-21 ENCOUNTER — Other Ambulatory Visit (HOSPITAL_COMMUNITY): Payer: Self-pay

## 2024-09-10 ENCOUNTER — Other Ambulatory Visit: Payer: Self-pay

## 2024-09-10 ENCOUNTER — Other Ambulatory Visit (HOSPITAL_COMMUNITY): Payer: Self-pay

## 2024-10-02 ENCOUNTER — Other Ambulatory Visit (HOSPITAL_COMMUNITY): Payer: Self-pay

## 2024-10-05 ENCOUNTER — Other Ambulatory Visit (HOSPITAL_COMMUNITY): Payer: Self-pay

## 2024-10-06 ENCOUNTER — Other Ambulatory Visit (HOSPITAL_COMMUNITY): Payer: Self-pay

## 2024-10-06 ENCOUNTER — Other Ambulatory Visit: Payer: Self-pay

## 2024-11-07 ENCOUNTER — Other Ambulatory Visit: Payer: Self-pay

## 2024-11-07 ENCOUNTER — Other Ambulatory Visit (HOSPITAL_COMMUNITY): Payer: Self-pay

## 2024-11-08 ENCOUNTER — Other Ambulatory Visit (HOSPITAL_COMMUNITY): Payer: Self-pay

## 2024-12-01 ENCOUNTER — Other Ambulatory Visit (HOSPITAL_COMMUNITY): Payer: Self-pay

## 2025-01-19 ENCOUNTER — Ambulatory Visit: Payer: MEDICAID | Admitting: Family Medicine
